# Patient Record
Sex: Male | Born: 1950 | Race: White | Hispanic: No | Marital: Married | Smoking: Never smoker
Health system: Southern US, Community
[De-identification: ages and names within clinical notes are randomized; demographics above are authoritative.]

## PROBLEM LIST (undated history)

## (undated) DIAGNOSIS — R519 Headache, unspecified: Secondary | ICD-10-CM

## (undated) DIAGNOSIS — K219 Gastro-esophageal reflux disease without esophagitis: Secondary | ICD-10-CM

## (undated) DIAGNOSIS — Z9889 Other specified postprocedural states: Secondary | ICD-10-CM

## (undated) DIAGNOSIS — R7303 Prediabetes: Secondary | ICD-10-CM

## (undated) DIAGNOSIS — N400 Enlarged prostate without lower urinary tract symptoms: Secondary | ICD-10-CM

## (undated) DIAGNOSIS — N390 Urinary tract infection, site not specified: Secondary | ICD-10-CM

## (undated) DIAGNOSIS — R112 Nausea with vomiting, unspecified: Secondary | ICD-10-CM

## (undated) DIAGNOSIS — C801 Malignant (primary) neoplasm, unspecified: Secondary | ICD-10-CM

## (undated) DIAGNOSIS — Z87442 Personal history of urinary calculi: Secondary | ICD-10-CM

## (undated) DIAGNOSIS — Z8781 Personal history of (healed) traumatic fracture: Secondary | ICD-10-CM

## (undated) DIAGNOSIS — Z87898 Personal history of other specified conditions: Secondary | ICD-10-CM

## (undated) DIAGNOSIS — R21 Rash and other nonspecific skin eruption: Secondary | ICD-10-CM

## (undated) DIAGNOSIS — R399 Unspecified symptoms and signs involving the genitourinary system: Secondary | ICD-10-CM

## (undated) DIAGNOSIS — I639 Cerebral infarction, unspecified: Secondary | ICD-10-CM

## (undated) DIAGNOSIS — K603 Anal fistula, unspecified: Secondary | ICD-10-CM

## (undated) DIAGNOSIS — K449 Diaphragmatic hernia without obstruction or gangrene: Secondary | ICD-10-CM

## (undated) DIAGNOSIS — R011 Cardiac murmur, unspecified: Secondary | ICD-10-CM

## (undated) DIAGNOSIS — M1990 Unspecified osteoarthritis, unspecified site: Secondary | ICD-10-CM

## (undated) DIAGNOSIS — F32A Depression, unspecified: Secondary | ICD-10-CM

## (undated) DIAGNOSIS — Z85828 Personal history of other malignant neoplasm of skin: Secondary | ICD-10-CM

## (undated) DIAGNOSIS — F419 Anxiety disorder, unspecified: Secondary | ICD-10-CM

## (undated) DIAGNOSIS — F329 Major depressive disorder, single episode, unspecified: Secondary | ICD-10-CM

## (undated) DIAGNOSIS — I2510 Atherosclerotic heart disease of native coronary artery without angina pectoris: Secondary | ICD-10-CM

## (undated) HISTORY — PX: SHOULDER ARTHROSCOPY W/ ROTATOR CUFF REPAIR: SHX2400

## (undated) HISTORY — PX: HERNIA REPAIR: SHX51

## (undated) HISTORY — PX: NASAL SEPTUM SURGERY: SHX37

## (undated) HISTORY — PX: BACK SURGERY: SHX140

## (undated) HISTORY — PX: EYE SURGERY: SHX253

## (undated) HISTORY — PX: CARDIAC CATHETERIZATION: SHX172

## (undated) HISTORY — PX: HAND SURGERY: SHX662

## (undated) HISTORY — PX: LUMBAR FUSION: SHX111

## (undated) HISTORY — DX: Unspecified osteoarthritis, unspecified site: M19.90

---
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
## 1979-05-24 HISTORY — PX: NASAL SEPTUM SURGERY: SHX37

## 1998-01-21 ENCOUNTER — Emergency Department (HOSPITAL_COMMUNITY): Admission: EM | Admit: 1998-01-21 | Discharge: 1998-01-21 | Payer: Self-pay | Admitting: Emergency Medicine

## 1998-08-30 ENCOUNTER — Ambulatory Visit (HOSPITAL_COMMUNITY): Admission: RE | Admit: 1998-08-30 | Discharge: 1998-08-30 | Payer: Self-pay | Admitting: Gastroenterology

## 1998-12-03 ENCOUNTER — Ambulatory Visit (HOSPITAL_COMMUNITY): Admission: RE | Admit: 1998-12-03 | Discharge: 1998-12-03 | Payer: Self-pay | Admitting: Gastroenterology

## 2008-08-24 ENCOUNTER — Ambulatory Visit: Payer: Self-pay | Admitting: Cardiology

## 2010-08-22 ENCOUNTER — Encounter: Admission: RE | Admit: 2010-08-22 | Discharge: 2010-08-22 | Payer: Self-pay | Admitting: Family Medicine

## 2011-02-04 NOTE — Assessment & Plan Note (Signed)
[facility]                            CARDIOLOGY OFFICE NOTE   JAQWON, MANFRED                       MRN:          [id number]  DATE:08/24/2008                            DOB:          02/23/1951    PRIMARY CARE PHYSICIAN:  Kristian Covey, PA   REASON FOR PRESENTATION:  Evaluate the patient with abnormal EKG.   HISTORY OF PRESENT ILLNESS:  The patient is a pleasant 60 year old  gentleman without prior cardiac history.  He was seeing Mr. Tysinger  recently and did complain of some abdominal discomfort.  This actually  radiated up in to his chest.  This happened sporadically.  However, he  has been taking Rolaids and this seems to help.  He does have some  abdominal discomfort to palpation and is actually having [place] to evaluate  and recently had an abdominal CT.  He does not get this discomfort.  He  does not get any chest discomfort.  He is not particularly active.  He  walks at work.  With this level of activity, he denies any chest  pressure, neck discomfort.  He occasionally get headaches and when he  does get headaches he will have this discomfort radiate in to his left  arm.  This was a discomfort in the back of the left side of his head.  It happens sporadically.  It goes away spontaneously.  He cannot bring  this on with activity.  He does not describe any shortness of breath.  He has no PND or orthopnea.  He has had no palpitation, presyncope, or  syncope.   When the patient was seen by his primary recently, he was noted to have  slightly abnormal EKG as described below.   PAST MEDICAL HISTORY:  Benign prostatic hypertrophy, gastroesophageal  reflux, and nephrolithiasis.   PAST SURGICAL HISTORY:  Two lumbar diskectomies, right rotator cuff  repair, left thumb surgery, and nasal surgery.   ALLERGIES:  Intolerance to codeine.   MEDICATIONS:  1. Flomax 0.4 mg daily.  2. Fluticasone.  3. Mucinex.  4. Prilosec 20 mg daily.  5.  Aspirin 81 mg daily.   SOCIAL HISTORY:  The patient is married.  He has 3 children.  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  He has never smoked cigarettes.  He does not drink  alcohol.   FAMILY HISTORY:  Noncontributory for early coronary artery disease.   REVIEW OF SYSTEMS:  As stated in the HPI and negative for difficulty  with the urinary stream.  Negative for all other systems.   PHYSICAL EXAMINATION:  GENERAL:  The patient is pleasant and in no  distress.  VITAL SIGNS:  Blood pressure 124/84, heart rate 72 and regular, and  weight 231 pounds, body mass index 26.  HEENT:  Eyes, unremarkable, pupils are equal, round and reactive to  light, fundi not visualized, oral mucosa unremarkable.  NECK:  No jugular venous distension at 45 degrees; carotid upstroke,  brisk and symmetrical.  No bruits.  No thyromegaly.  LYMPHATICS:  No cervical, axillary, inguinal adenopathy.  LUNGS:  Clear to auscultation bilaterally.  BACK:  No costovertebral angle tenderness.  CHEST:  Unremarkable.  HEART:  PMI not displaced or sustained.  S1 and S2 are within normal  limits.  No S3, no S4, no clicks, no rubs, no murmurs.  ABDOMEN:  Flat.  Bowel sound is normal in frequency and pitch.  No  bruits, no rebound, no guarding.  No midline pulsatile mass, no  hepatomegaly, no splenomegaly.  SKIN:  No rashes, no nodules.  EXTREMITIES:  2+ pulses throughout.  No edema, no cyanosis, no clubbing.  NEUROLOGIC:  Oriented to person, place, and time.  Cranial nerves II-XII  grossly intact.  Motor grossly intact.   EKG sinus rhythm, rate 66, axis within normal limits, intervals within  normal limits, RSR prime in V1 and V2.  No acute ST-T wave changes.   ASSESSMENT AND PLAN:  1. Abnormal EKG.  The patient does have an RSR prime in V1 and V2.      However, this is not indicative of any obstructive coronary artery      disease or other structural heart disease.  He otherwise does not      have any complaints consistent with  cardiac disease.  He has      minimal cardiovascular risk factors.  At this point, the pretest      probability of obstructive coronary artery disease or structural      heart disease is extremely low.  No further cardiovascular testing      is suggested.  2. Dyslipidemia.  The patient has a low HDL at 30.  His LDL is      acceptable as are his triglycerides and total cholesterol.  I had a      long discussion with him about the need to start walking to      increase his HDL.  3. Benign prostatic hypertrophy.  This was followed and he remains on      Flomax.  4. Followup.  The patient will come back to this clinic as needed.     Rollene Rotunda, MD, [facility]  Electronically Signed    JH/MedQ  DD: 08/24/2008  DT: 08/25/2008  Job #: [id number]   cc:   Kristian Covey, PA

## 2011-09-12 ENCOUNTER — Ambulatory Visit (HOSPITAL_COMMUNITY): Admission: RE | Admit: 2011-09-12 | Payer: Self-pay | Source: Ambulatory Visit | Admitting: Gastroenterology

## 2011-09-12 ENCOUNTER — Encounter (HOSPITAL_COMMUNITY): Admission: RE | Payer: Self-pay | Source: Ambulatory Visit

## 2011-09-12 SURGERY — EGD (ESOPHAGOGASTRODUODENOSCOPY)
Anesthesia: Moderate Sedation

## 2011-12-25 ENCOUNTER — Encounter (HOSPITAL_COMMUNITY): Payer: Self-pay | Admitting: *Deleted

## 2011-12-25 ENCOUNTER — Observation Stay (HOSPITAL_COMMUNITY)
Admission: EM | Admit: 2011-12-25 | Discharge: 2011-12-26 | Disposition: A | Discharge status: Home / Self Care | Payer: No Typology Code available for payment source | Attending: Internal Medicine | Admitting: Internal Medicine

## 2011-12-25 DIAGNOSIS — T783XXA Angioneurotic edema, initial encounter: Secondary | ICD-10-CM | POA: Diagnosis present

## 2011-12-25 DIAGNOSIS — R51 Headache: Secondary | ICD-10-CM | POA: Diagnosis not present

## 2011-12-25 DIAGNOSIS — R0989 Other specified symptoms and signs involving the circulatory and respiratory systems: Secondary | ICD-10-CM | POA: Insufficient documentation

## 2011-12-25 DIAGNOSIS — K219 Gastro-esophageal reflux disease without esophagitis: Secondary | ICD-10-CM | POA: Diagnosis not present

## 2011-12-25 DIAGNOSIS — J329 Chronic sinusitis, unspecified: Secondary | ICD-10-CM | POA: Diagnosis present

## 2011-12-25 DIAGNOSIS — R1310 Dysphagia, unspecified: Secondary | ICD-10-CM | POA: Diagnosis present

## 2011-12-25 DIAGNOSIS — M25519 Pain in unspecified shoulder: Secondary | ICD-10-CM | POA: Diagnosis not present

## 2011-12-25 DIAGNOSIS — X58XXXA Exposure to other specified factors, initial encounter: Secondary | ICD-10-CM | POA: Diagnosis not present

## 2011-12-25 DIAGNOSIS — R030 Elevated blood-pressure reading, without diagnosis of hypertension: Secondary | ICD-10-CM | POA: Insufficient documentation

## 2011-12-25 DIAGNOSIS — Z79899 Other long term (current) drug therapy: Secondary | ICD-10-CM | POA: Diagnosis not present

## 2011-12-25 DIAGNOSIS — IMO0001 Reserved for inherently not codable concepts without codable children: Secondary | ICD-10-CM | POA: Diagnosis present

## 2011-12-25 DIAGNOSIS — R0609 Other forms of dyspnea: Secondary | ICD-10-CM | POA: Insufficient documentation

## 2011-12-25 DIAGNOSIS — R519 Headache, unspecified: Secondary | ICD-10-CM | POA: Diagnosis present

## 2011-12-25 HISTORY — DX: Gastro-esophageal reflux disease without esophagitis: K21.9

## 2011-12-25 MED ORDER — DIPHENHYDRAMINE HCL 25 MG PO TABS: 25.0000 mg | ORAL_TABLET | Freq: Four times a day (QID) | ORAL | Status: DC

## 2011-12-25 MED ORDER — DIPHENHYDRAMINE HCL 50 MG/ML IJ SOLN
25.0000 mg | Freq: Once | INTRAMUSCULAR | Status: AC
  Administered 2011-12-25: 19:00:00 via INTRAVENOUS
  Filled 2011-12-25: qty 1

## 2011-12-25 MED ORDER — ACETAMINOPHEN 325 MG PO TABS
650.0000 mg | ORAL_TABLET | Freq: Once | ORAL | Status: AC
  Administered 2011-12-25: 650 mg via ORAL

## 2011-12-25 MED ORDER — FAMOTIDINE IN NACL 20-0.9 MG/50ML-% IV SOLN
20.0000 mg | Freq: Once | INTRAVENOUS | Status: AC
  Administered 2011-12-25: 20 mg via INTRAVENOUS
  Filled 2011-12-25: qty 50

## 2011-12-25 MED ORDER — DIPHENHYDRAMINE HCL 50 MG/ML IJ SOLN
INTRAMUSCULAR | Status: AC
  Filled 2011-12-25: qty 1

## 2011-12-25 MED ORDER — FAMOTIDINE 40 MG PO TABS: 40.0000 mg | ORAL_TABLET | Freq: Every day | ORAL | Status: DC

## 2011-12-25 MED ORDER — PREDNISONE 10 MG PO TABS: 20.0000 mg | ORAL_TABLET | Freq: Every day | ORAL | Status: DC

## 2011-12-25 NOTE — Discharge Instructions (Signed)
Would recommend that you see an allergist for formal testing   Angioedema Angioedema (AE) is sudden puffiness (swelling) of the skin. It can happen to the face, genitals, and other body parts. You may be reacting to something you are sensitive to (allergic reaction). It may have been passed to you from your parents (hereditary), or it may develop by itself (acquired). You may also get red itchy patches of skin (hives). Attacks may be mild. Some attacks are life-threatening. Most often, the puffiness happens fast. It often gets better in 24 to 48 hours.  HOME CARE  Carry your emergency allergy medicines with you.   Wear a medical bracelet.   Avoid things that you know will cause this reaction (triggers).  GET HELP RIGHT AWAY IF:   You have trouble breathing.   You have trouble swallowing.   You pass out (faint).   You have another attack.   Your attacks happen more often or get worse.   AE was passed to you by your parents and you want to start having children.  MAKE SURE YOU:   Understand these instructions.   Will watch your condition.   Will get help right away if you are not doing well or get worse.  Document Released: 08/27/2009 Document Revised: 08/28/2011 Document Reviewed: 08/27/2009 [facility] Patient Information 2012 [place], [place].

## 2011-12-25 NOTE — ED Notes (Signed)
ZOX:[id number]<AV> Expected date:12/25/11<BR> Expected time:<BR> Means of arrival:<BR> Comments:<BR> EMS GC - allergic reaction

## 2011-12-25 NOTE — ED Notes (Addendum)
beaton md alerted of pts condition. And given pts paper work from clinic and ekg from clinic

## 2011-12-25 NOTE — ED Notes (Signed)
MD at bedside. 

## 2011-12-25 NOTE — ED Provider Notes (Signed)
History     CSN: [id number]  Arrival date & time 12/25/11  1836   First MD Initiated Contact with Patient 12/25/11 2012      Chief Complaint  Patient presents with  . Allergic Reaction     HPI Patient presents with swelling to the left upper lip and difficulty swallowing which started today.  Patient has no previous history of abnormalities.  Patient also complains of difficulty swallowing.  Patient denies wheezing or stridor.  Currently the patient takes aspirin and omeprazole.  No unusual foods or allergenic foods eaten prior to the onset of symptoms. Past Medical History  Diagnosis Date  . GERD (gastroesophageal reflux disease)     History reviewed. No pertinent past surgical history.  History reviewed. No pertinent family history.  History  Substance Use Topics  . Smoking status: Never Smoker   . Smokeless tobacco: Not on file  . Alcohol Use: No      Review of Systems  All other systems reviewed and are negative.    Allergies  Codeine  Home Medications   Current Outpatient Rx  Name Route Sig Dispense Refill  . ACETAMINOPHEN 500 MG PO TABS Oral Take 1,500 mg by mouth every 6 (six) hours as needed. pain    . ASPIRIN 81 MG PO TABS Oral Take 81 mg by mouth daily.    . OMEGA-3 FATTY ACIDS 1000 MG PO CAPS Oral Take 2 g by mouth daily.    Marland Kitchen OMEPRAZOLE 10 MG PO CPDR Oral Take 10 mg by mouth daily.    Marland Kitchen DIPHENHYDRAMINE HCL 25 MG PO TABS Oral Take 1 tablet (25 mg total) by mouth every 6 (six) hours. 20 tablet 0  . FAMOTIDINE 40 MG PO TABS Oral Take 1 tablet (40 mg total) by mouth daily. 10 tablet 0  . PREDNISONE 10 MG PO TABS Oral Take 2 tablets (20 mg total) by mouth daily. 15 tablet 0    BP 142/68  Pulse 72  Temp(Src) 97.5 F (36.4 C) (Oral)  Resp 18  SpO2 94%  Physical Exam  Nursing note and vitals reviewed. Constitutional: He is oriented to person, place, and time. He appears well-developed and well-nourished. No distress.  HENT:  Head: Normocephalic and  atraumatic.    Eyes: Pupils are equal, round, and reactive to light.  Neck: Normal range of motion.  Cardiovascular: Normal rate and intact distal pulses.   Pulmonary/Chest: No respiratory distress.  Abdominal: Normal appearance. He exhibits no distension.  Musculoskeletal: Normal range of motion.  Neurological: He is alert and oriented to person, place, and time. No cranial nerve deficit.  Skin: Skin is warm and dry. No rash noted.  Psychiatric: He has a normal mood and affect. His behavior is normal.    ED Course  Procedures (including critical care time) Scheduled Meds:   . acetaminophen  650 mg Oral Once  . diphenhydrAMINE  25 mg Intravenous Once  . famotidine (PEPCID) IV  20 mg Intravenous Once   Continuous Infusions:  PRN Meds:.  Labs Reviewed - No data to display No results found.   1. Angioedema       MDM          Nelia Shi, MD 12/25/11 2322

## 2011-12-25 NOTE — ED Notes (Addendum)
Per ems pt is from [facility] [place]. Pt reports that this morning he ate a normal breakfast that he always eats, no change in medications or detergents. Pt was at work and sent to clinic due to difficulty breathing and increased facial swelling and redness. At clinic they gave epi 1:1 0.3mg  IM ad 40mg  kenalog and 4mg  decadron. EMS gave 50mg  benadryl IV. Clinic called ems bc pt was continuing to have difficulty breathing and facial swelling.  Pt alert and oriented x4. Pt reports "it feels like it is hard to breath, a knot in his throat and difficult to swallow."

## 2011-12-26 ENCOUNTER — Encounter (HOSPITAL_COMMUNITY): Payer: Self-pay | Admitting: Family Medicine

## 2011-12-26 ENCOUNTER — Observation Stay (HOSPITAL_COMMUNITY): Payer: No Typology Code available for payment source

## 2011-12-26 DIAGNOSIS — T783XXA Angioneurotic edema, initial encounter: Secondary | ICD-10-CM | POA: Diagnosis present

## 2011-12-26 DIAGNOSIS — J329 Chronic sinusitis, unspecified: Secondary | ICD-10-CM | POA: Diagnosis present

## 2011-12-26 DIAGNOSIS — R1310 Dysphagia, unspecified: Secondary | ICD-10-CM | POA: Diagnosis present

## 2011-12-26 DIAGNOSIS — IMO0001 Reserved for inherently not codable concepts without codable children: Secondary | ICD-10-CM | POA: Diagnosis present

## 2011-12-26 DIAGNOSIS — M25519 Pain in unspecified shoulder: Secondary | ICD-10-CM | POA: Diagnosis present

## 2011-12-26 DIAGNOSIS — K219 Gastro-esophageal reflux disease without esophagitis: Secondary | ICD-10-CM | POA: Diagnosis present

## 2011-12-26 DIAGNOSIS — R51 Headache: Secondary | ICD-10-CM | POA: Diagnosis present

## 2011-12-26 DIAGNOSIS — R519 Headache, unspecified: Secondary | ICD-10-CM | POA: Diagnosis present

## 2011-12-26 LAB — CBC
MCH: 32.9 pg (ref 26.0–34.0)
MCHC: 35.3 g/dL (ref 30.0–36.0)
Platelets: 164 10*3/uL (ref 150–400)

## 2011-12-26 LAB — BASIC METABOLIC PANEL
Calcium: 9.2 mg/dL (ref 8.4–10.5)
GFR calc non Af Amer: >90 mL/min (ref >90)
Sodium: 139 mEq/L (ref 135–145)

## 2011-12-26 MED ORDER — ACETAMINOPHEN 325 MG PO TABS: 650.0000 mg | ORAL_TABLET | Freq: Four times a day (QID) | ORAL | Status: DC | PRN

## 2011-12-26 MED ORDER — OMEPRAZOLE 10 MG PO CPDR: DELAYED_RELEASE_CAPSULE | ORAL | Status: DC

## 2011-12-26 MED ORDER — DIPHENHYDRAMINE HCL 25 MG PO CAPS: 25.0000 mg | ORAL_CAPSULE | ORAL | Status: DC | PRN

## 2011-12-26 MED ORDER — AMOXICILLIN-POT CLAVULANATE 875-125 MG PO TABS
1.0000 | ORAL_TABLET | Freq: Two times a day (BID) | ORAL | Status: DC
  Administered 2011-12-26: 1 via ORAL
  Filled 2011-12-26 (×4): qty 1

## 2011-12-26 MED ORDER — FAMOTIDINE 20 MG PO TABS
20.0000 mg | ORAL_TABLET | Freq: Two times a day (BID) | ORAL | Status: DC
  Administered 2011-12-26: 20 mg via ORAL
  Filled 2011-12-26 (×4): qty 1

## 2011-12-26 MED ORDER — OMEPRAZOLE 10 MG PO CPDR: 10.0000 mg | DELAYED_RELEASE_CAPSULE | Freq: Every day | ORAL | Status: DC

## 2011-12-26 MED ORDER — SODIUM CHLORIDE 0.9 % IV SOLN
INTRAVENOUS | Status: AC
  Administered 2011-12-26: 75 mL via INTRAVENOUS

## 2011-12-26 MED ORDER — BOOST / RESOURCE BREEZE PO LIQD
1.0000 | Freq: Three times a day (TID) | ORAL | Status: DC
  Administered 2011-12-26 (×2): 1 via ORAL

## 2011-12-26 MED ORDER — ACETAMINOPHEN 650 MG RE SUPP: 650.0000 mg | Freq: Four times a day (QID) | RECTAL | Status: DC | PRN

## 2011-12-26 MED ORDER — HYDROMORPHONE HCL PF 1 MG/ML IJ SOLN
0.5000 mg | Freq: Four times a day (QID) | INTRAMUSCULAR | Status: DC | PRN
  Administered 2011-12-26: 0.5 mg via INTRAVENOUS
  Filled 2011-12-26: qty 1

## 2011-12-26 MED ORDER — TRAMADOL HCL 50 MG PO TABS
50.0000 mg | ORAL_TABLET | Freq: Four times a day (QID) | ORAL | Status: DC | PRN
  Administered 2011-12-26 (×2): 50 mg via ORAL
  Filled 2011-12-26 (×2): qty 1

## 2011-12-26 MED ORDER — OMEPRAZOLE 20 MG PO CPDR: 20.0000 mg | DELAYED_RELEASE_CAPSULE | Freq: Every day | ORAL | Status: DC

## 2011-12-26 MED ORDER — SODIUM CHLORIDE 0.9 % IJ SOLN
3.0000 mL | Freq: Two times a day (BID) | INTRAMUSCULAR | Status: DC
  Administered 2011-12-26: 3 mL via INTRAVENOUS

## 2011-12-26 MED ORDER — PREDNISONE 10 MG PO TABS: ORAL_TABLET | ORAL | Status: DC

## 2011-12-26 MED ORDER — DIPHENHYDRAMINE HCL 25 MG PO TABS: 25.0000 mg | ORAL_TABLET | Freq: Four times a day (QID) | ORAL | Status: DC | PRN

## 2011-12-26 MED ORDER — METHYLPREDNISOLONE SODIUM SUCC 125 MG IJ SOLR
80.0000 mg | Freq: Two times a day (BID) | INTRAMUSCULAR | Status: DC
  Administered 2011-12-26: 80 mg via INTRAVENOUS
  Filled 2011-12-26 (×4): qty 1.28

## 2011-12-26 NOTE — Progress Notes (Signed)
INITIAL ADULT NUTRITION ASSESSMENT Date: 12/26/2011   Time: 9:57 AM Reason for Assessment: Nutrition Risk for Dysphagia  ASSESSMENT: Male 61 y.o.  Dx: Angioedema of lips  Hx:  Past Medical History  Diagnosis Date  . GERD (gastroesophageal reflux disease)   . Vertebral fracture     x2 neck area    Related Meds:  Scheduled Meds:   . acetaminophen  650 mg Oral Once  . amoxicillin-clavulanate  1 tablet Oral Q12H  . diphenhydrAMINE  25 mg Intravenous Once  . famotidine (PEPCID) IV  20 mg Intravenous Once  . methylPREDNISolone (SOLU-MEDROL) injection  80 mg Intravenous Q12H  . sodium chloride  3 mL Intravenous Q12H   Continuous Infusions:   . sodium chloride 75 mL (12/26/11 0400)   PRN Meds:.acetaminophen, acetaminophen, diphenhydrAMINE, traMADol   Ht: 6\' 3"  (190.5 cm)  Wt: 225 lb 11.2 oz (102.377 kg)  Ideal Wt: 89.09 kg % Ideal Wt: 114.7 %  *Patient Stated UBW of 292-232 lb. a week ago. Patient has lost approximately 4 lb. over 1 week.  Usual Wt: 292-232 lb.  % Usual Wt: 96.9-98.2%   Body mass index is 28.21 kg/(m^2). (Overweight)   Food/Nutrition Related Hx: Patient reports stomach pain. He reported he is hungry but due to his clear liquid diet restriction he has limited options. He reported he has been eating apple sauce and dislikes the soup broth. Would like to have jello and try Nutrition supplement Resource Breeze. He reported good PO intake prior to hospitalization. He reports difficulty swallowing, upon swallowing foods feel like they lump and get stuck in the throat.   Labs:  CMP     Component Value Date/Time   NA 139 12/26/2011 0420   K 3.8 12/26/2011 0420   CL 105 12/26/2011 0420   CO2 22 12/26/2011 0420   GLUCOSE 139* 12/26/2011 0420   BUN 14 12/26/2011 0420   CREATININE 0.73 12/26/2011 0420   CALCIUM 9.2 12/26/2011 0420   GFRNONAA >90 12/26/2011 0420   GFRAA >90 12/26/2011 0420    Intake/Output Summary (Last 24 hours) at 12/26/11 1000 Last data filed at 12/26/11  0700  Gross per 24 hour  Intake    620 ml  Output    400 ml  Net    220 ml     Diet Order: Clear Liquid  Supplements/Tube Feeding: none at this time  IVF:    sodium chloride Last Rate: 75 mL (12/26/11 0400)    Estimated Nutritional Needs:   Kcal: 1900-2200 Protein: 112.5-132.9 grams Fluid: 1 ml per kcal  NUTRITION DIAGNOSIS: -Inadequate oral intake (NI-2.1).  Status: Ongoing  RELATED TO: limited ability to eat  AS EVIDENCE BY: Clear liquid diet restriction, patient with poor PO intake due to lack of food options and weight loss of approximately 4 lb over 1 week.   MONITORING/EVALUATION(Goals): Diet Advancement, Weight Trends, Labs, PO intake, I/O's 1. PO intake > 75% at meals and supplements 2. Diet advancement as medically able 3. Minimize weight loss/ Promote weight maintenance.  4. Upon diet advancement ease of swallowing with Dysphagia 3 diet.   EDUCATION NEEDS: -No education needs identified at this time  INTERVENTION: 1. Recommend Dysphagia 3 diet when diet is advanced. 2. Will order patient snack of Jello BID. 3. Will order Resource Breeze Nutrition Supplement TID. Provides 750 kcal and 27 grams of protein daily.   Dietitian [phone]  DOCUMENTATION CODES Per approved criteria  -Not Applicable    Iven Finn [facility] 12/26/2011, 9:57 AM

## 2011-12-26 NOTE — H&P (Signed)
History and Physical Examination  Date: 12/26/2011  Patient name: Kyle Davidson Medical record number: [id number] Date of birth: 06/06/1951 Age: 61 y.o. Gender: male PCP: No primary provider on file.  Attending physician: Dorothea Ogle, MD  Chief Complaint:  Chief Complaint  Patient presents with  . Allergic Reaction     History of Present Illness: Kyle Davidson is an 61 y.o. male with GERD who had been taking some sudafed for a sinus infection reports that this morning he ate a normal breakfast with foods that he always eats like sausage and tomato, no change in medications or detergents and developed around 10 am swelling in the lips and sensation of lump in the throat. Pt was at work and sent to his primary care clinic due to some increased work of breathing and increased facial swelling and redness. At clinic they gave epi 1:1 0.3mg  IM and 40mg  kenalog and 4mg  decadron. EMS gave 50mg  benadryl IV. Clinic called ems bc pt was continuing to have difficulty breathing and facial swelling. Pt was seen in the ER and monitored for several hours.  He had a small improvement in the swelling of the lips but continued to have a sensation of a lump in the throat and difficulty swallowing.  He did not feel comfortable going home because he is currently living alone and his wife is out of town.  In his own words, Pt reports "it feels like it is hard to breath, a knot in my throat and difficult to swallow." He has remained hemodynamically stable and appears to be in no distress.  A hospital admission was requested for observation to be sure symptoms completely resolve and to monitor pt for any deterioration in symptoms.     Past Medical History Past Medical History  Diagnosis Date  . GERD (gastroesophageal reflux disease)     Past Surgical History History reviewed. No pertinent past surgical history.  Home Meds: Prior to Admission medications   Medication Sig Start Date End Date Taking? Authorizing  Provider  acetaminophen (TYLENOL) 500 MG tablet Take 1,500 mg by mouth every 6 (six) hours as needed. pain   Yes Historical Provider, MD  aspirin 81 MG tablet Take 81 mg by mouth daily.   Yes Historical Provider, MD  fish oil-omega-3 fatty acids 1000 MG capsule Take 2 g by mouth daily.   Yes Historical Provider, MD  omeprazole (PRILOSEC) 10 MG capsule Take 10 mg by mouth daily.   Yes Historical Provider, MD  diphenhydrAMINE (BENADRYL) 25 MG tablet Take 1 tablet (25 mg total) by mouth every 6 (six) hours. 12/25/11 01/24/12  Nelia Shi, MD  famotidine (PEPCID) 40 MG tablet Take 1 tablet (40 mg total) by mouth daily. 12/25/11 12/24/12  Nelia Shi, MD  predniSONE (DELTASONE) 10 MG tablet Take 2 tablets (20 mg total) by mouth daily. 12/25/11   Nelia Shi, MD    Allergies: Codeine  Social History:  History   Social History  . Marital Status: Married    Spouse Name: N/A    Number of Children: N/A  . Years of Education: N/A   Occupational History  . Not on file.   Social History Main Topics  . Smoking status: Never Smoker   . Smokeless tobacco: Not on file  . Alcohol Use: No  . Drug Use: No  . Sexually Active:    Other Topics Concern  . Not on file   Social History Narrative  . No narrative on file  Family History: History reviewed. No pertinent family history.  Review of Systems: Pertinent items are noted in HPI. All other systems reviewed and reported as negative.   Physical Exam: Blood pressure 142/68, pulse 72, temperature 97.5 F (36.4 C), temperature source Oral, resp. rate 18, SpO2 94.00%. General appearance: alert, cooperative, appears stated age and no distress Head: Normocephalic, without obvious abnormality, atraumatic Eyes: negative, conjunctivae/corneas clear. PERRL, EOM's intact. Fundi benign. Nose: sinus tenderness bilateral, thick yellow crusted turbinates bilateral Throat: lips, mucosa, and tongue normal; teeth and gums normal Neck: no adenopathy, no  carotid bruit, no JVD, supple, symmetrical, trachea midline and thyroid not enlarged, symmetric, no tenderness/mass/nodules Back: symmetric, no curvature. ROM normal. No CVA tenderness. Lungs: clear to auscultation bilaterally Chest wall: no tenderness Heart: S1, S2 normal Abdomen: soft, non-tender; bowel sounds normal; no masses,  no organomegaly Extremities: extremities normal, atraumatic, no cyanosis or edema Pulses: 2+ and symmetric Neurologic: Alert and oriented X 3, normal strength and tone. Normal symmetric reflexes. Normal coordination and gait  Lab  And Imaging results:  No results found for this or any previous visit (from the past 24 hour(s)). EKG Results:  No orders found for this or any previous visit.    Impression   Angioedema of lips  GERD (gastroesophageal reflux disease)  Dysphagia  Headache  Sinusitis  Elevated BP  Left Shoulder pain   Plan  Admit for further observation.  Pt has received epinephrine, benadryl, famotidine, and steroids.  Check soft tissue xrays of neck, and start clear liquid diet, monitor swallowing, monitor respiratory status.  Treat sinusitis.  Please see orders.   Standley Dakins MD [facility] [place], [place] [phone] 12/26/2011, 12:07 AM

## 2011-12-26 NOTE — Progress Notes (Signed)
See discharge summary

## 2011-12-26 NOTE — Discharge Summary (Signed)
DISCHARGE SUMMARY  Kyle Davidson  MR#: [id number]  DOB:04-25-1951  Date of Admission: 12/25/2011 Date of Discharge: 12/26/2011  Attending Physician:Kyle Davidson K  Patient's PCP: Kyle Davidson  Consults: -none  Discharge Diagnoses: Present on Admission:  .Angioedema of lips .GERD (gastroesophageal reflux disease) .Dysphagia .Headache .Sinusitis .Elevated BP .Shoulder pain   Initial presentation: Patient is a 61 year old white male past we'll history of GERD and who presented after an severe allergic reaction to an unknown cause and presented with angioedema of the face and lips and complaining of difficulty swallowing. Patient was given steroids in the emergency room plus Benadryl plus H2 blocker. And was admitted to the hospital service overnight for observation.  Hospital Course: Patient Active Problem List  Diagnoses  . Angioedema of lips: Improved by day 2. Continued on steroids plus Benadryl plus H2 blocker. My afternoon, he was tolerating some by mouth. He seemed be quite anxious and complaining of generalized abdominal pain, although when pressed further this was been going on for the last month. We'll plan to discharge the patient home on a prednisone taper plus Benadryl plus H2 blocker and follow up with his PCP. I've advised allergy testing in the near future to determine what the exact etiology was. The meantime we'll hold his daily aspirin.   Marland Kitchen GERD (gastroesophageal reflux disease) continue PPI after course of H2 blockers finished.   Marland Kitchen Dysphagia: Stable. Soft tissue film showed no signs of any airway compromise. I feel more this may been secondary to anxiety.   .   .   .      Medication List  As of 12/26/2011 10:39 PM   STOP taking these medications         aspirin 81 MG tablet         TAKE these medications         acetaminophen 500 MG tablet   Commonly known as: TYLENOL   Take 1,500 mg by mouth every 6 (six) hours as needed. pain      diphenhydrAMINE  25 MG tablet   Commonly known as: BENADRYL   Take 1 tablet (25 mg total) by mouth every 6 (six) hours as needed for itching.      famotidine 40 MG tablet   Commonly known as: PEPCID   Take 1 tablet (40 mg total) by mouth daily.      fish oil-omega-3 fatty acids 1000 MG capsule   Take 2 g by mouth daily.      omeprazole 10 MG capsule   Commonly known as: PRILOSEC   Take 1 capsule (10 mg total) by mouth daily.      predniSONE 10 MG tablet   Commonly known as: DELTASONE   Take 60mg  (6 pills) on Day 1, then 50mg  on day 2, then 40mg  on day 3 and decrease by 10mg  daily until finished.             Day of Discharge BP 149/84  Pulse 80  Temp(Src) 97.7 F (36.5 C) (Oral)  Resp 20  Ht 6\' 3"  (1.905 m)  Wt 102.377 kg (225 lb 11.2 oz)  BMI 28.21 kg/m2  SpO2 95%  Physical Exam: General: Alert and oriented x3, mild distress secondary to anxiousness, fatigued HEENT: Normocephalic, minimal edema around the eyelids, mucous membranes are dry But after: Regular rate and rhythm, S1-S2 Lungs: Clear to auscultation bilaterally, no wheezing Abdomen: Soft, nontender, nondistended Extremity: No clubbing or cyanosis, trace pitting edema  Results for orders placed during the hospital encounter of  12/25/11 (from the past 24 hour(s))  CBC     Status: Normal   Collection Time   12/26/11  4:20 AM      Component Value Range   WBC 8.3  4.0 - 10.5 (K/uL)   RBC 5.07  4.22 - 5.81 (MIL/uL)   Hemoglobin 16.7  13.0 - 17.0 (g/dL)   HCT 40.9  81.1 - 91.4 (%)   MCV 93.3  78.0 - 100.0 (fL)   MCH 32.9  26.0 - 34.0 (pg)   MCHC 35.3  30.0 - 36.0 (g/dL)   RDW 78.2  95.6 - 21.3 (%)   Platelets 164  150 - 400 (K/uL)  BASIC METABOLIC PANEL     Status: Abnormal   Collection Time   12/26/11  4:20 AM      Component Value Range   Sodium 139  135 - 145 (mEq/L)   Potassium 3.8  3.5 - 5.1 (mEq/L)   Chloride 105  96 - 112 (mEq/L)   CO2 22  19 - 32 (mEq/L)   Glucose, Bld 139 (*) 70 - 99 (mg/dL)   BUN 14  6 - 23  (mg/dL)   Creatinine, Ser 0.86  0.50 - 1.35 (mg/dL)   Calcium 9.2  8.4 - 57.8 (mg/dL)   GFR calc non Af Amer >90  >90 (mL/min)   GFR calc Af Amer >90  >90 (mL/min)    Disposition: Improved, being discharged home in the care of his wife   Follow-up Appts: Discharge Orders    Future Orders Please Complete By Expires   DIET DYS 3      Scheduling Instructions:   Continue soft diet for next few days   Increase activity slowly           Tests Needing Follow-up: allergy testing in the future  Time spent in discharge (includes decision making & examination of pt): 55 minutes  Signed: Hollice Espy 12/26/2011, 10:39 PM

## 2011-12-26 NOTE — Progress Notes (Signed)
Subjective: Awake, alert. Reports no improvement in swelling of face/lips. Continued difficulty swallowing. Reports sensation of something in throat. Also complains diffuse abdominal pain particularly LLQ.  Objective: Vital signs Filed Vitals:   12/26/11 0015 12/26/11 0030 12/26/11 0209 12/26/11 0500  BP: 145/72 132/63 136/88 142/81  Pulse: 74 73 74 70  Temp:   97.4 F (36.3 C) 97.7 F (36.5 C)  TempSrc:   Oral Oral  Resp:   18 18  Height:   6\' 3"  (1.905 m)   Weight:   102.377 kg (225 lb 11.2 oz)   SpO2: 96% 94% 96% 96%   Weight change:  Last BM Date: 12/25/11  Intake/Output from previous day: 04/04 0701 - 04/05 0700 In: 620 [P.O.:120; I.V.:500] Out: 400 [Urine:400]     Physical Exam: General: Alert, awake, oriented x3, in no acute distress but somewhat anxious HEENT: No bruits, no goiter. Face flushed with slightly puffy eyelids. Lips mild to moderate swelling. Mucus membranes mouth moist/pink. Neck soft, supple Heart: Regular rate and rhythm, without murmurs, rubs, gallops. Lungs: Normal effort.  Breath sounds clear to auscultation bilaterally. No wheeze, rhonchi Abdomen: Soft, moderate tenderness to palpation particulary LLQ, nondistended, positive bowel sounds but somewhat sluggish. Extremities: No clubbing cyanosis or edema with positive pedal pulses. Neuro: Grossly intact, nonfocal. Cranial nerve II-XII intact    Lab Results: Basic Metabolic Panel:  Basename 12/26/11 0420  NA 139  K 3.8  CL 105  CO2 22  GLUCOSE 139*  BUN 14  CREATININE 0.73  CALCIUM 9.2  MG --  PHOS --   Liver Function Tests: No results found for this basename: AST:2,ALT:2,ALKPHOS:2,BILITOT:2,PROT:2,ALBUMIN:2 in the last 72 hours No results found for this basename: LIPASE:2,AMYLASE:2 in the last 72 hours No results found for this basename: AMMONIA:2 in the last 72 hours CBC:  Basename 12/26/11 0420  WBC 8.3  NEUTROABS --  HGB 16.7  HCT 47.3  MCV 93.3  PLT 164   Cardiac  Enzymes: No results found for this basename: CKTOTAL:3,CKMB:3,CKMBINDEX:3,TROPONINI:3 in the last 72 hours BNP: No results found for this basename: PROBNP:3 in the last 72 hours D-Dimer: No results found for this basename: DDIMER:2 in the last 72 hours CBG: No results found for this basename: GLUCAP:6 in the last 72 hours Hemoglobin A1C: No results found for this basename: HGBA1C in the last 72 hours Fasting Lipid Panel: No results found for this basename: CHOL,HDL,LDLCALC,TRIG,CHOLHDL,LDLDIRECT in the last 72 hours Thyroid Function Tests: No results found for this basename: TSH,T4TOTAL,FREET4,T3FREE,THYROIDAB in the last 72 hours Anemia Panel: No results found for this basename: VITAMINB12,FOLATE,FERRITIN,TIBC,IRON,RETICCTPCT in the last 72 hours Coagulation: No results found for this basename: LABPROT:2,INR:2 in the last 72 hours Urine Drug Screen: Drugs of Abuse  No results found for this basename: labopia, cocainscrnur, labbenz, amphetmu, thcu, labbarb    Alcohol Level: No results found for this basename: ETH:2 in the last 72 hours Urinalysis: No results found for this basename: COLORURINE:2,APPERANCEUR:2,LABSPEC:2,PHURINE:2,GLUCOSEU:2,HGBUR:2,BILIRUBINUR:2,KETONESUR:2,PROTEINUR:2,UROBILINOGEN:2,NITRITE:2,LEUKOCYTESUR:2 in the last 72 hours Misc. Labs:  No results found for this or any previous visit (from the past 240 hour(s)).  Studies/Results: No results found.  Medications: Scheduled Meds:   . acetaminophen  650 mg Oral Once  . amoxicillin-clavulanate  1 tablet Oral Q12H  . diphenhydrAMINE  25 mg Intravenous Once  . famotidine (PEPCID) IV  20 mg Intravenous Once  . sodium chloride  3 mL Intravenous Q12H   Continuous Infusions:   . sodium chloride 75 mL (12/26/11 0400)   PRN Meds:.acetaminophen, acetaminophen, diphenhydrAMINE, traMADol  Assessment/Plan:  Principal Problem:  *Angioedema of lips: etiology unclear. Not much improvement this am after getting  epinephrine, benadryl, famotidine and steroids in ED. Xray of neck results without abnormalities.  Continue famotidine and benadryl. Sats range 94-97 room air.  Active Problems:  Abdominal pain: etiology uncertain. Moderate tenderness to palpation LLQ. BS sluggish. Reports gas. Will keep on clears and check KUB.    GERD (gastroesophageal reflux disease):  On clear liquids and pepcid.    Dysphagia: Likely related to #1. Tolerating clear liquids and po meds. Neck/soft tissue xray results without abnormalities  Monitor closely   Elevated BP: no hx HTN. SBP range 132-142.  Monitor  Dispo: to home when medically stable. Hopefully 24-48hrs.   Code status: full code.       LOS: 1 day   [facility] M 12/26/2011, 8:32 AM

## 2011-12-26 NOTE — Progress Notes (Signed)
   CARE MANAGEMENT NOTE 12/26/2011  Patient:  Kyle Davidson,Kyle Davidson   Account Number:  [id number]  Date Initiated:  12/26/2011  Documentation initiated by:  Lanier Clam  Subjective/Objective Assessment:   ADMITTED W/SWELLING FACE/LIPS.     Action/Plan:   FROM HOME W/SPOUSE   Anticipated DC Date:  12/27/2011   Anticipated DC Plan:  HOME/SELF CARE         Choice offered to / List presented to:             Status of service:  In process, will continue to follow Medicare Important Message given?   (If response is "NO", the following Medicare IM given date fields will be blank) Date Medicare IM given:   Date Additional Medicare IM given:    Discharge Disposition:    Per UR Regulation:  Reviewed for med. necessity/level of care/duration of stay  If discussed at Long Length of Stay Meetings, dates discussed:    Comments:  12/26/11 [facility] RN,BSN NCM [phone]

## 2012-01-14 ENCOUNTER — Other Ambulatory Visit: Payer: Self-pay | Admitting: Gastroenterology

## 2012-01-21 ENCOUNTER — Ambulatory Visit
Admission: RE | Admit: 2012-01-21 | Discharge: 2012-01-21 | Disposition: A | Discharge status: Home / Self Care | Payer: No Typology Code available for payment source | Source: Ambulatory Visit | Attending: Gastroenterology | Admitting: Gastroenterology

## 2012-02-04 ENCOUNTER — Other Ambulatory Visit: Payer: Self-pay | Admitting: Gastroenterology

## 2012-02-09 ENCOUNTER — Ambulatory Visit
Admission: RE | Admit: 2012-02-09 | Discharge: 2012-02-09 | Disposition: A | Discharge status: Home / Self Care | Payer: No Typology Code available for payment source | Source: Ambulatory Visit | Attending: Gastroenterology | Admitting: Gastroenterology

## 2013-07-11 ENCOUNTER — Encounter (INDEPENDENT_AMBULATORY_CARE_PROVIDER_SITE_OTHER): Payer: Self-pay | Admitting: Surgery

## 2013-07-11 ENCOUNTER — Ambulatory Visit (INDEPENDENT_AMBULATORY_CARE_PROVIDER_SITE_OTHER): Payer: BC Managed Care – PPO | Admitting: Surgery

## 2013-07-11 ENCOUNTER — Encounter (INDEPENDENT_AMBULATORY_CARE_PROVIDER_SITE_OTHER): Payer: Self-pay

## 2013-07-11 VITALS — BP 140/92 | HR 80 | Temp 98.2°F | Resp 16 | Ht 75.0 in | Wt 228.8 lb

## 2013-07-11 DIAGNOSIS — K4020 Bilateral inguinal hernia, without obstruction or gangrene, not specified as recurrent: Secondary | ICD-10-CM | POA: Insufficient documentation

## 2013-07-11 DIAGNOSIS — K5900 Constipation, unspecified: Secondary | ICD-10-CM | POA: Insufficient documentation

## 2013-07-11 DIAGNOSIS — K429 Umbilical hernia without obstruction or gangrene: Secondary | ICD-10-CM

## 2013-07-11 NOTE — Progress Notes (Signed)
Patient ID: Kyle Davidson, male   DOB: Jun 21, 1951, 62 y.o.   MRN: [id number]  Chief Complaint  Patient presents with  . New Evaluation    eval bilateral ing hernias    HPI Kyle Davidson is a 61 y.o. male.  Patient sent at the request of Dr. Herb Grays with multiple complaints today. He complains of head and face pain as well as left ear pain. He complains of abdominal pain especially under his left rib cage, constipation, green stool, and bilateral inguinal hernia. He has been evaluated with CT scanning which shows multiple right renal calculi nonobstructing, bilateral inguinal hernia, an appendicolith which is noninflamed. Mostly of his abdominal pain is left upper quadrant sharp especially when he turns but not at rest. He has problems with constipation and prolonged sitting on the commode and has a bowel movement every 3 days or so which is hard. He does a lot of straining. No blood in his urine. Hemoccult positive test he states the doctors office but normal colonoscopy 2 years ago. No visible blood when he has a bowel movement. HPI  Past Medical History  Diagnosis Date  . GERD (gastroesophageal reflux disease)   . Vertebral fracture     x2 neck area  . Arthritis     Past Surgical History  Procedure Laterality Date  . Back surgery    . Shoulder arthroscopy    . Neck surgery      Family History  Problem Relation Age of Onset  . Cancer Father     bladder & prostate    Social History History  Substance Use Topics  . Smoking status: Never Smoker   . Smokeless tobacco: Never Used  . Alcohol Use: No    Allergies  Allergen Reactions  . Codeine Other (See Comments)    Bad headaches.....makes him see things    Current Outpatient Prescriptions  Medication Sig Dispense Refill  . acetaminophen (TYLENOL) 500 MG tablet Take 1,500 mg by mouth every 6 (six) hours as needed. pain      . omeprazole (PRILOSEC) 10 MG capsule Take 40 mg by mouth 2 (two) times daily.       No  current facility-administered medications for this visit.    Review of Systems Review of Systems  Constitutional: Negative for fatigue.  HENT: Positive for congestion and ear pain.   Eyes: Negative.   Respiratory: Negative.   Gastrointestinal: Positive for abdominal pain, blood in stool and abdominal distention.  Genitourinary: Positive for testicular pain.  Musculoskeletal: Positive for back pain.  Neurological: Negative.   Hematological: Negative.   Psychiatric/Behavioral: Negative.     Blood pressure 140/92, pulse 80, temperature 98.2 F (36.8 C), temperature source Temporal, resp. rate 16, height 6\' 3"  (1.905 m), weight 228 lb 12.8 oz (103.783 kg).  Physical Exam Physical Exam  Constitutional: He is oriented to person, place, and time. He appears well-developed and well-nourished.  HENT:  Head: Normocephalic and atraumatic.  Eyes: EOM are normal. Pupils are equal, round, and reactive to light.  Neck: Normal range of motion. Neck supple.  Cardiovascular: Normal rate and regular rhythm.   Pulmonary/Chest: Effort normal and breath sounds normal.  Abdominal: He exhibits distension. A hernia is present. Hernia confirmed positive in the right inguinal area and confirmed positive in the left inguinal area.    Genitourinary:     Musculoskeletal: Normal range of motion.  Neurological: He is alert and oriented to person, place, and time.  Skin: Skin is warm.  Psychiatric: He has a normal mood and affect. His behavior is normal. Thought content normal.    Data Reviewed Ct A/P NOVANT  Scan shows right-sided kidney stones without hydronephrosis an appendicolith noninflamed bilaterally inguinal hernia  Assessment    Bilateral inguinal hernia reducible  Chronic constipation  Kidney stones right without obstruction  Epididymal cyst left scrotum    Plan    The patient has multiple complaints and problems. I explained to him that his inguinal hernias are not causing his  head and ear problems. He does have what sounds like constipation and now start him on MiraLAX twice a day, high fiber diet and have given instructions for that. I do not feel the appendicolith is causing his abdominal complaints at this time and can be followed. Recommend laparoscopic repair bilateral inguinal hernia.The risk of hernia repair include bleeding,  Infection,   Recurrence of the hernia,  Mesh use, chronic pain,  Organ injury,  Bowel injury,  Bladder injury,   nerve injury with numbness around the incision,  Death,  and worsening of preexisting  medical problems.  The alternatives to surgery have been discussed as well..  Long term expectations of both operative and non operative treatments have been discussed.   The patient agrees to proceed.       [name] A. 07/11/2013, 4:29 PM

## 2013-07-11 NOTE — Patient Instructions (Signed)
Hernia Repair with Laparoscope A hernia occurs when an internal organ pushes out through a weak spot in the belly (abdominal) wall muscles. Hernias most commonly occur in the groin and around the navel. Hernias can also occur through a cut by the surgeon (incision) after an abdominal operation. A hernia may be caused by:  Lifting heavy objects.  Prolonged coughing.  Straining to move your bowels. Hernias can often be pushed back into place (reduced). Most hernias tend to get worse over time. Problems occur when abdominal contents get stuck in the opening and the blood supply is blocked or impaired (incarcerated hernia). Because of these risks, you require surgery to repair the hernia. Your hernia will be repaired using a laparoscope. Laparoscopic surgery is a type of minimally invasive surgery. It does not involve making a typical surgical cut (incision) in the skin. A laparoscope is a telescope-like rod and lens system. It is usually connected to a video camera and a light source so your caregiver can clearly see the operative area. The instruments are inserted through  to  inch (5 mm or 10 mm) openings in the skin at specific locations. A working and viewing space is created by blowing a small amount of carbon dioxide gas into the abdominal cavity. The abdomen is essentially blown up like a balloon (insufflated). This elevates the abdominal wall above the internal organs like a dome. The carbon dioxide gas is common to the human body and can be absorbed by tissue and removed by the respiratory system. Once the repair is completed, the small incisions will be closed with either stitches (sutures) or staples (just like a paper stapler only this staple holds the skin together). LET YOUR CAREGIVERS KNOW ABOUT:  Allergies.  Medications taken including herbs, eye drops, over the counter medications, and creams.  Use of steroids (by mouth or creams).  Previous problems with anesthetics or  Novocaine.  Possibility of pregnancy, if this applies.  History of blood clots (thrombophlebitis).  History of bleeding or blood problems.  Previous surgery.  Other health problems. BEFORE THE PROCEDURE  Laparoscopy can be done either in a hospital or out-patient clinic. You may be given a mild sedative to help you relax before the procedure. Once in the operating room, you will be given a general anesthesia to make you sleep (unless you and your caregiver choose a different anesthetic).  AFTER THE PROCEDURE  After the procedure you will be watched in a recovery area. Depending on what type of hernia was repaired, you might be admitted to the hospital or you might go home the same day. With this procedure you may have less pain and scarring. This usually results in a quicker recovery and less risk of infection. HOME CARE INSTRUCTIONS   Bed rest is not required. You may continue your normal activities but avoid heavy lifting (more than 10 pounds) or straining.  Cough gently. If you are a smoker it is best to stop, as even the best hernia repair can break down with the continual strain of coughing.  Avoid driving until given the OK by your surgeon.  There are no dietary restrictions unless given otherwise.  TAKE ALL MEDICATIONS AS DIRECTED.  Only take over-the-counter or prescription medicines for pain, discomfort, or fever as directed by your caregiver. SEEK MEDICAL CARE IF:   There is increasing abdominal pain or pain in your incisions.  There is more bleeding from incisions, other than minimal spotting.  You feel light headed or faint.  You   develop an unexplained fever, chills, and/or an oral temperature above 102 F (38.9 C).  You have redness, swelling, or increasing pain in the wound.  Pus coming from wound.  A foul smell coming from the wound or dressings. SEEK IMMEDIATE MEDICAL CARE IF:   You develop a rash.  You have difficulty breathing.  You have any  allergic problems. MAKE SURE YOU:   Understand these instructions.  Will watch your condition.  Will get help right away if you are not doing well or get worse. Document Released: 09/08/2005 Document Revised: 12/01/2011 Document Reviewed: 08/08/2009 [facility] Patient Information 2014 [place], [place]. Constipation, Adult Constipation is when a person has fewer than 3 bowel movements a week; has difficulty having a bowel movement; or has stools that are dry, hard, or larger than normal. As people grow older, constipation is more common. If you try to fix constipation with medicines that make you have a bowel movement (laxatives), the problem may get worse. Long-term laxative use may cause the muscles of the colon to become weak. A low-fiber diet, not taking in enough fluids, and taking certain medicines may make constipation worse. CAUSES   Certain medicines, such as antidepressants, pain medicine, iron supplements, antacids, and water pills.   Certain diseases, such as diabetes, irritable bowel syndrome (IBS), thyroid disease, or depression.   Not drinking enough water.   Not eating enough fiber-rich foods.   Stress or travel.  Lack of physical activity or exercise.  Not going to the restroom when there is the urge to have a bowel movement.  Ignoring the urge to have a bowel movement.  Using laxatives too much. SYMPTOMS   Having fewer than 3 bowel movements a week.   Straining to have a bowel movement.   Having hard, dry, or larger than normal stools.   Feeling full or bloated.   Pain in the lower abdomen.  Not feeling relief after having a bowel movement. DIAGNOSIS  Your caregiver will take a medical history and perform a physical exam. Further testing may be done for severe constipation. Some tests may include:   A barium enema X-ray to examine your rectum, colon, and sometimes, your small intestine.  A sigmoidoscopy to examine your lower colon.  A  colonoscopy to examine your entire colon. TREATMENT  Treatment will depend on the severity of your constipation and what is causing it. Some dietary treatments include drinking more fluids and eating more fiber-rich foods. Lifestyle treatments may include regular exercise. If these diet and lifestyle recommendations do not help, your caregiver may recommend taking over-the-counter laxative medicines to help you have bowel movements. Prescription medicines may be prescribed if over-the-counter medicines do not work.  HOME CARE INSTRUCTIONS   Increase dietary fiber in your diet, such as fruits, vegetables, whole grains, and beans. Limit high-fat and processed sugars in your diet, such as [place] fries, hamburgers, cookies, candies, and soda.   A fiber supplement may be added to your diet if you cannot get enough fiber from foods.   Drink enough fluids to keep your urine clear or pale yellow.   Exercise regularly or as directed by your caregiver.   Go to the restroom when you have the urge to go. Do not hold it.  Only take medicines as directed by your caregiver. Do not take other medicines for constipation without talking to your caregiver first. SEEK IMMEDIATE MEDICAL CARE IF:   You have bright red blood in your stool.   Your constipation lasts for more  than 4 days or gets worse.   You have abdominal or rectal pain.   You have thin, pencil-like stools.  You have unexplained weight loss. MAKE SURE YOU:   Understand these instructions.  Will watch your condition.  Will get help right away if you are not doing well or get worse. Document Released: 06/06/2004 Document Revised: 12/01/2011 Document Reviewed: 08/12/2011 [facility] Patient Information 2014 [place], [place]. High-Fiber Diet Fiber is found in fruits, vegetables, and grains. A high-fiber diet encourages the addition of more whole grains, legumes, fruits, and vegetables in your diet. The recommended amount of fiber for  adult males is 38 g per day. For adult females, it is 25 g per day. Pregnant and lactating women should get 28 g of fiber per day. If you have a digestive or bowel problem, ask your caregiver for advice before adding high-fiber foods to your diet. Eat a variety of high-fiber foods instead of only a select few type of foods.  PURPOSE  To increase stool bulk.  To make bowel movements more regular to prevent constipation.  To lower cholesterol.  To prevent overeating. WHEN IS THIS DIET USED?  It may be used if you have constipation and hemorrhoids.  It may be used if you have uncomplicated diverticulosis (intestine condition) and irritable bowel syndrome.  It may be used if you need help with weight management.  It may be used if you want to add it to your diet as a protective measure against atherosclerosis, diabetes, and cancer. SOURCES OF FIBER  Whole-grain breads and cereals.  Fruits, such as apples, oranges, bananas, berries, prunes, and pears.  Vegetables, such as green peas, carrots, sweet potatoes, beets, broccoli, cabbage, spinach, and artichokes.  Legumes, such split peas, soy, lentils.  Almonds. FIBER CONTENT IN FOODS Starches and Grains / Dietary Fiber (g)  Cheerios, 1 cup / 3 g  Corn Flakes cereal, 1 cup / 0.7 g  Rice crispy treat cereal, 1 cup / 0.3 g  Instant oatmeal (cooked),  cup / 2 g  Frosted wheat cereal, 1 cup / 5.1 g  Brown, long-grain rice (cooked), 1 cup / 3.5 g  White, long-grain rice (cooked), 1 cup / 0.6 g  Enriched macaroni (cooked), 1 cup / 2.5 g Legumes / Dietary Fiber (g)  Baked beans (canned, plain, or vegetarian),  cup / 5.2 g  Kidney beans (canned),  cup / 6.8 g  Pinto beans (cooked),  cup / 5.5 g Breads and Crackers / Dietary Fiber (g)  Plain or honey graham crackers, 2 squares / 0.7 g  Saltine crackers, 3 squares / 0.3 g  Plain, salted pretzels, 10 pieces / 1.8 g  Whole-wheat bread, 1 slice / 1.9 g  White bread, 1  slice / 0.7 g  Raisin bread, 1 slice / 1.2 g  Plain bagel, 3 oz / 2 g  Flour tortilla, 1 oz / 0.9 g  Corn tortilla, 1 small / 1.5 g  Hamburger or hotdog bun, 1 small / 0.9 g Fruits / Dietary Fiber (g)  Apple with skin, 1 medium / 4.4 g  Sweetened applesauce,  cup / 1.5 g  Banana,  medium / 1.5 g  Grapes, 10 grapes / 0.4 g  Orange, 1 small / 2.3 g  Raisin, 1.5 oz / 1.6 g  Melon, 1 cup / 1.4 g Vegetables / Dietary Fiber (g)  Green beans (canned),  cup / 1.3 g  Carrots (cooked),  cup / 2.3 g  Broccoli (cooked),  cup /  2.8 g  Peas (cooked),  cup / 4.4 g  Mashed potatoes,  cup / 1.6 g  Lettuce, 1 cup / 0.5 g  Corn (canned),  cup / 1.6 g  Tomato,  cup / 1.1 g Document Released: 09/08/2005 Document Revised: 03/09/2012 Document Reviewed: 12/11/2011 [facility] Patient Information 2014 [place], [place].   MIRALAX 17 GRAMS TWICE A DAY 64 OUNCES OF WATER A DAY NO SODAS WALK DAILY

## 2013-07-12 ENCOUNTER — Telehealth (INDEPENDENT_AMBULATORY_CARE_PROVIDER_SITE_OTHER): Payer: Self-pay

## 2013-07-12 NOTE — Telephone Encounter (Signed)
Patient called to get clarification of bowel prep before surgery. Advised patient Dr. Luisa Hart has ordered for him to start Miralax twice a day and to increase his fiber in his diet. Patient verbalizes understanding

## 2013-07-15 ENCOUNTER — Encounter (INDEPENDENT_AMBULATORY_CARE_PROVIDER_SITE_OTHER): Payer: Self-pay

## 2013-08-01 ENCOUNTER — Encounter (HOSPITAL_COMMUNITY): Payer: Self-pay | Admitting: Pharmacy Technician

## 2013-08-05 ENCOUNTER — Other Ambulatory Visit (HOSPITAL_COMMUNITY): Payer: Self-pay | Admitting: *Deleted

## 2013-08-05 NOTE — Pre-Procedure Instructions (Addendum)
Kyle Davidson  08/05/2013   Your procedure is scheduled on:  Tuesday, August 16, 2013    Report to [facility] Entrance "A" at 7:00 AM.   Call this number if you have problems the morning of surgery: [phone]   Remember:   Do not eat food or drink liquids after midnight Monday, 08/15/13.   Take these medicines the morning of surgery with A SIP OF WATER:  pantoprazole (PROTONIX), acetaminophen (TYLENOL) - if needed.  Stop all Vitamins, Herbal Medications, Fish Oil, Aspirin and NSAIDS (Aleve) as of 08/09/13.      Do not wear jewelry.  Do not wear lotions, powders, or cologne. You may wear deodorant.  Do not shave 48 hours prior to surgery. Men may shave face and neck.  Do not bring valuables to the hospital.  [facility] is not responsible                  for any belongings or valuables.               Contacts, dentures or bridgework may not be worn into surgery.  Leave suitcase in the car. After surgery it may be brought to your room.  For patients admitted to the hospital, discharge time is determined by your                treatment team.               Patients discharged the day of surgery will not be allowed to drive  home.  Name and phone number of your driver: Family/frien   Special Instructions: Shower using CHG 2 nights before surgery and the night before surgery.  If you shower the day of surgery use CHG.  Use special wash - you have one bottle of CHG for all showers.  You should use approximately 1/3 of the bottle for each shower.   Please read over the following fact sheets that you were given: Pain Booklet, Coughing and Deep Breathing and Surgical Site Infection Prevention

## 2013-08-08 ENCOUNTER — Encounter (HOSPITAL_COMMUNITY)
Admission: RE | Admit: 2013-08-08 | Discharge: 2013-08-08 | Disposition: A | Discharge status: Home / Self Care | Payer: BC Managed Care – PPO | Source: Ambulatory Visit | Attending: Surgery | Admitting: Surgery

## 2013-08-08 ENCOUNTER — Other Ambulatory Visit (INDEPENDENT_AMBULATORY_CARE_PROVIDER_SITE_OTHER): Payer: Self-pay | Admitting: Surgery

## 2013-08-08 ENCOUNTER — Telehealth (INDEPENDENT_AMBULATORY_CARE_PROVIDER_SITE_OTHER): Payer: Self-pay | Admitting: General Surgery

## 2013-08-08 ENCOUNTER — Encounter (HOSPITAL_COMMUNITY): Payer: Self-pay

## 2013-08-08 DIAGNOSIS — Z01812 Encounter for preprocedural laboratory examination: Secondary | ICD-10-CM | POA: Insufficient documentation

## 2013-08-08 DIAGNOSIS — Z01818 Encounter for other preprocedural examination: Secondary | ICD-10-CM | POA: Insufficient documentation

## 2013-08-08 HISTORY — DX: Cardiac murmur, unspecified: R01.1

## 2013-08-08 HISTORY — DX: Personal history of urinary calculi: Z87.442

## 2013-08-08 HISTORY — DX: Other specified postprocedural states: Z98.890

## 2013-08-08 HISTORY — DX: Malignant (primary) neoplasm, unspecified: C80.1

## 2013-08-08 HISTORY — DX: Nausea with vomiting, unspecified: R11.2

## 2013-08-08 HISTORY — DX: Depression, unspecified: F32.A

## 2013-08-08 LAB — CBC WITH DIFFERENTIAL/PLATELET
Basophils Absolute: 0 10*3/uL (ref 0.0–0.1)
HCT: 48.5 % (ref 39.0–52.0)
Lymphocytes Relative: 34 % (ref 12–46)
Lymphs Abs: 2.1 10*3/uL (ref 0.7–4.0)
Monocytes Absolute: 0.5 10*3/uL (ref 0.1–1.0)
Neutro Abs: 3.1 10*3/uL (ref 1.7–7.7)
RBC: 5.35 MIL/uL (ref 4.22–5.81)
RDW: 13.2 % (ref 11.5–15.5)
WBC: 6.2 10*3/uL (ref 4.0–10.5)

## 2013-08-08 LAB — COMPREHENSIVE METABOLIC PANEL
ALT: 28 U/L (ref 0–53)
AST: 29 U/L (ref 0–37)
CO2: 24 mEq/L (ref 19–32)
Chloride: 107 mEq/L (ref 96–112)
Creatinine, Ser: 0.92 mg/dL (ref 0.50–1.35)
GFR calc non Af Amer: 89 mL/min — ABNORMAL LOW (ref >90)
Glucose, Bld: 104 mg/dL — ABNORMAL HIGH (ref 70–99)
Sodium: 142 mEq/L (ref 135–145)
Total Bilirubin: 1 mg/dL (ref 0.3–1.2)
Total Protein: 7.7 g/dL (ref 6.0–8.3)

## 2013-08-08 NOTE — Telephone Encounter (Signed)
Kyle Davidson with [facility] Short [facility] called and states the orders we have for surgery do not match consent. Please add umbilical hernia repair to consent.

## 2013-08-08 NOTE — Progress Notes (Addendum)
Office called re: patient understands to repair umbilical hernia as well as bilateral hernias.spoke with [place]. To convey message to dr cornett to add to consent.

## 2013-08-15 MED ORDER — CHLORHEXIDINE GLUCONATE 4 % EX LIQD: 1.0000 "application " | Freq: Once | CUTANEOUS | Status: DC

## 2013-08-15 MED ORDER — DEXTROSE 5 % IV SOLN
3.0000 g | INTRAVENOUS | Status: AC
  Administered 2013-08-16: 3 g via INTRAVENOUS
  Filled 2013-08-15: qty 3000

## 2013-08-16 ENCOUNTER — Encounter (HOSPITAL_COMMUNITY)
Admission: RE | Disposition: A | Discharge status: Home / Self Care | Payer: Self-pay | Source: Ambulatory Visit | Attending: Surgery

## 2013-08-16 ENCOUNTER — Ambulatory Visit (HOSPITAL_COMMUNITY)
Admission: RE | Admit: 2013-08-16 | Discharge: 2013-08-16 | Disposition: A | Discharge status: Home / Self Care | Payer: BC Managed Care – PPO | Source: Ambulatory Visit | Attending: Surgery | Admitting: Surgery

## 2013-08-16 ENCOUNTER — Ambulatory Visit (HOSPITAL_COMMUNITY): Payer: BC Managed Care – PPO | Admitting: Anesthesiology

## 2013-08-16 ENCOUNTER — Encounter (HOSPITAL_COMMUNITY): Payer: BC Managed Care – PPO | Admitting: Anesthesiology

## 2013-08-16 DIAGNOSIS — D176 Benign lipomatous neoplasm of spermatic cord: Secondary | ICD-10-CM | POA: Insufficient documentation

## 2013-08-16 DIAGNOSIS — K429 Umbilical hernia without obstruction or gangrene: Secondary | ICD-10-CM | POA: Insufficient documentation

## 2013-08-16 DIAGNOSIS — K219 Gastro-esophageal reflux disease without esophagitis: Secondary | ICD-10-CM | POA: Insufficient documentation

## 2013-08-16 DIAGNOSIS — K4020 Bilateral inguinal hernia, without obstruction or gangrene, not specified as recurrent: Secondary | ICD-10-CM | POA: Insufficient documentation

## 2013-08-16 DIAGNOSIS — N200 Calculus of kidney: Secondary | ICD-10-CM | POA: Insufficient documentation

## 2013-08-16 DIAGNOSIS — K389 Disease of appendix, unspecified: Secondary | ICD-10-CM | POA: Insufficient documentation

## 2013-08-16 DIAGNOSIS — M129 Arthropathy, unspecified: Secondary | ICD-10-CM | POA: Insufficient documentation

## 2013-08-16 DIAGNOSIS — N509 Disorder of male genital organs, unspecified: Secondary | ICD-10-CM | POA: Insufficient documentation

## 2013-08-16 DIAGNOSIS — K5900 Constipation, unspecified: Secondary | ICD-10-CM

## 2013-08-16 DIAGNOSIS — J329 Chronic sinusitis, unspecified: Secondary | ICD-10-CM

## 2013-08-16 HISTORY — PX: INGUINAL HERNIA REPAIR: SHX194

## 2013-08-16 HISTORY — PX: INSERTION OF MESH: SHX5868

## 2013-08-16 SURGERY — REPAIR, HERNIA, INGUINAL, LAPAROSCOPIC
Anesthesia: General | Site: Abdomen | Laterality: Bilateral | Wound class: Clean

## 2013-08-16 MED ORDER — ONDANSETRON HCL 4 MG/2ML IJ SOLN
4.0000 mg | Freq: Once | INTRAMUSCULAR | Status: AC | PRN
  Administered 2013-08-16: 4 mg via INTRAVENOUS

## 2013-08-16 MED ORDER — PROPOFOL 10 MG/ML IV BOLUS
INTRAVENOUS | Status: DC | PRN
  Administered 2013-08-16: 200 mg via INTRAVENOUS

## 2013-08-16 MED ORDER — KETOROLAC TROMETHAMINE 30 MG/ML IJ SOLN
INTRAMUSCULAR | Status: DC | PRN
  Administered 2013-08-16: 30 mg via INTRAVENOUS

## 2013-08-16 MED ORDER — ONDANSETRON HCL 4 MG/2ML IJ SOLN
4.0000 mg | Freq: Four times a day (QID) | INTRAMUSCULAR | Status: DC | PRN
  Filled 2013-08-16: qty 2

## 2013-08-16 MED ORDER — SODIUM CHLORIDE 0.9 % IR SOLN
Status: DC | PRN
  Administered 2013-08-16: 1000 mL

## 2013-08-16 MED ORDER — ACETAMINOPHEN 650 MG RE SUPP
650.0000 mg | RECTAL | Status: DC | PRN
  Filled 2013-08-16: qty 1

## 2013-08-16 MED ORDER — EVICEL 5 ML EX KIT
PACK | CUTANEOUS | Status: AC
  Filled 2013-08-16: qty 1

## 2013-08-16 MED ORDER — ACETAMINOPHEN 325 MG PO TABS
650.0000 mg | ORAL_TABLET | ORAL | Status: DC | PRN
  Filled 2013-08-16: qty 2

## 2013-08-16 MED ORDER — ONDANSETRON HCL 4 MG/2ML IJ SOLN
INTRAMUSCULAR | Status: AC
  Administered 2013-08-16: 4 mg via INTRAVENOUS
  Filled 2013-08-16: qty 2

## 2013-08-16 MED ORDER — HYDROMORPHONE HCL PF 1 MG/ML IJ SOLN
0.2500 mg | INTRAMUSCULAR | Status: DC | PRN
  Administered 2013-08-16 (×2): 0.5 mg via INTRAVENOUS

## 2013-08-16 MED ORDER — VECURONIUM BROMIDE 10 MG IV SOLR
INTRAVENOUS | Status: DC | PRN
  Administered 2013-08-16 (×4): 2 mg via INTRAVENOUS

## 2013-08-16 MED ORDER — TRAMADOL HCL 50 MG PO TABS: 50.0000 mg | ORAL_TABLET | Freq: Four times a day (QID) | ORAL | Status: DC | PRN

## 2013-08-16 MED ORDER — KETOROLAC TROMETHAMINE 30 MG/ML IJ SOLN
15.0000 mg | Freq: Once | INTRAMUSCULAR | Status: AC | PRN
  Administered 2013-08-16: 30 mg via INTRAVENOUS

## 2013-08-16 MED ORDER — SODIUM CHLORIDE 0.9 % IJ SOLN: 3.0000 mL | Freq: Two times a day (BID) | INTRAMUSCULAR | Status: DC

## 2013-08-16 MED ORDER — KETOROLAC TROMETHAMINE 15 MG/ML IJ SOLN
15.0000 mg | Freq: Four times a day (QID) | INTRAMUSCULAR | Status: DC
  Filled 2013-08-16 (×4): qty 1

## 2013-08-16 MED ORDER — DEXAMETHASONE SODIUM PHOSPHATE 4 MG/ML IJ SOLN
INTRAMUSCULAR | Status: DC | PRN
  Administered 2013-08-16: 8 mg via INTRAVENOUS

## 2013-08-16 MED ORDER — ROCURONIUM BROMIDE 100 MG/10ML IV SOLN
INTRAVENOUS | Status: DC | PRN
  Administered 2013-08-16: 50 mg via INTRAVENOUS

## 2013-08-16 MED ORDER — FENTANYL CITRATE 0.05 MG/ML IJ SOLN
INTRAMUSCULAR | Status: DC | PRN
  Administered 2013-08-16: 50 ug via INTRAVENOUS
  Administered 2013-08-16: 100 ug via INTRAVENOUS
  Administered 2013-08-16: 50 ug via INTRAVENOUS
  Administered 2013-08-16: 150 ug via INTRAVENOUS
  Administered 2013-08-16: 50 ug via INTRAVENOUS

## 2013-08-16 MED ORDER — NEOSTIGMINE METHYLSULFATE 1 MG/ML IJ SOLN
INTRAMUSCULAR | Status: DC | PRN
  Administered 2013-08-16: 4 mg via INTRAVENOUS

## 2013-08-16 MED ORDER — SCOPOLAMINE 1 MG/3DAYS TD PT72
MEDICATED_PATCH | TRANSDERMAL | Status: DC | PRN
  Administered 2013-08-16: 1 via TRANSDERMAL

## 2013-08-16 MED ORDER — ONDANSETRON HCL 4 MG/2ML IJ SOLN
INTRAMUSCULAR | Status: DC | PRN
  Administered 2013-08-16: 4 mg via INTRAVENOUS

## 2013-08-16 MED ORDER — HYDROMORPHONE HCL PF 1 MG/ML IJ SOLN
INTRAMUSCULAR | Status: AC
  Filled 2013-08-16: qty 1

## 2013-08-16 MED ORDER — LACTATED RINGERS IV SOLN
INTRAVENOUS | Status: DC
  Administered 2013-08-16: 07:00:00 via INTRAVENOUS

## 2013-08-16 MED ORDER — FENTANYL CITRATE 0.05 MG/ML IJ SOLN
INTRAMUSCULAR | Status: AC
  Filled 2013-08-16: qty 2

## 2013-08-16 MED ORDER — SODIUM CHLORIDE 0.9 % IV SOLN: 250.0000 mL | INTRAVENOUS | Status: DC | PRN

## 2013-08-16 MED ORDER — LIDOCAINE HCL (CARDIAC) 20 MG/ML IV SOLN
INTRAVENOUS | Status: DC | PRN
  Administered 2013-08-16: 40 mg via INTRAVENOUS

## 2013-08-16 MED ORDER — OXYCODONE-ACETAMINOPHEN 5-325 MG PO TABS: 1.0000 | ORAL_TABLET | ORAL | Status: DC | PRN

## 2013-08-16 MED ORDER — KETOROLAC TROMETHAMINE 30 MG/ML IJ SOLN
INTRAMUSCULAR | Status: AC
  Filled 2013-08-16: qty 1

## 2013-08-16 MED ORDER — SODIUM CHLORIDE 0.9 % IJ SOLN: 3.0000 mL | INTRAMUSCULAR | Status: DC | PRN

## 2013-08-16 MED ORDER — MIDAZOLAM HCL 5 MG/5ML IJ SOLN
INTRAMUSCULAR | Status: DC | PRN
  Administered 2013-08-16: 2 mg via INTRAVENOUS

## 2013-08-16 MED ORDER — OXYCODONE HCL 5 MG PO TABS
ORAL_TABLET | ORAL | Status: AC
  Filled 2013-08-16: qty 2

## 2013-08-16 MED ORDER — FENTANYL CITRATE 0.05 MG/ML IJ SOLN
25.0000 ug | INTRAMUSCULAR | Status: DC | PRN
  Administered 2013-08-16 (×2): 50 ug via INTRAVENOUS

## 2013-08-16 MED ORDER — EVICEL 5 ML EX KIT
PACK | CUTANEOUS | Status: DC | PRN
  Administered 2013-08-16 (×2): 1

## 2013-08-16 MED ORDER — BUPIVACAINE-EPINEPHRINE 0.25% -1:200000 IJ SOLN
INTRAMUSCULAR | Status: DC | PRN
  Administered 2013-08-16: 9 mL

## 2013-08-16 MED ORDER — GLYCOPYRROLATE 0.2 MG/ML IJ SOLN
INTRAMUSCULAR | Status: DC | PRN
  Administered 2013-08-16: .8 mg via INTRAVENOUS

## 2013-08-16 MED ORDER — LACTATED RINGERS IV SOLN
INTRAVENOUS | Status: DC | PRN
  Administered 2013-08-16 (×2): via INTRAVENOUS

## 2013-08-16 MED ORDER — OXYCODONE HCL 5 MG PO TABS
5.0000 mg | ORAL_TABLET | ORAL | Status: DC | PRN
  Administered 2013-08-16: 10 mg via ORAL

## 2013-08-16 MED ORDER — 0.9 % SODIUM CHLORIDE (POUR BTL) OPTIME
TOPICAL | Status: DC | PRN
  Administered 2013-08-16: 1000 mL

## 2013-08-16 MED ORDER — BUPIVACAINE-EPINEPHRINE (PF) 0.25% -1:200000 IJ SOLN
INTRAMUSCULAR | Status: AC
  Filled 2013-08-16: qty 30

## 2013-08-16 SURGICAL SUPPLY — 42 items
APPLIER CLIP LOGIC TI 5 (MISCELLANEOUS) IMPLANT
BLADE SURG ROTATE 9660 (MISCELLANEOUS) ×4 IMPLANT
CANISTER SUCTION 2500CC (MISCELLANEOUS) IMPLANT
CHLORAPREP W/TINT 26ML (MISCELLANEOUS) ×2 IMPLANT
COVER SURGICAL LIGHT HANDLE (MISCELLANEOUS) ×2 IMPLANT
DERMABOND ADVANCED (GAUZE/BANDAGES/DRESSINGS) ×1
DERMABOND ADVANCED .7 DNX12 (GAUZE/BANDAGES/DRESSINGS) ×1 IMPLANT
DEVICE SECURE STRAP 25 ABSORB (INSTRUMENTS) ×2 IMPLANT
DISSECT BALLN SPACEMKR + OVL (BALLOONS) ×4
DISSECTOR BALLN SPACEMKR + OVL (BALLOONS) ×2 IMPLANT
DISSECTOR BLUNT TIP ENDO 5MM (MISCELLANEOUS) ×2 IMPLANT
DRAPE UTILITY 15X26 W/TAPE STR (DRAPE) ×4 IMPLANT
DRAPE WARM FLUID 44X44 (DRAPE) ×2 IMPLANT
ELECT REM PT RETURN 9FT ADLT (ELECTROSURGICAL) ×2
ELECTRODE REM PT RTRN 9FT ADLT (ELECTROSURGICAL) ×1 IMPLANT
GLOVE BIO SURGEON STRL SZ7.5 (GLOVE) ×2 IMPLANT
GLOVE BIO SURGEON STRL SZ8 (GLOVE) ×2 IMPLANT
GLOVE BIOGEL PI IND STRL 7.0 (GLOVE) ×1 IMPLANT
GLOVE BIOGEL PI IND STRL 7.5 (GLOVE) ×1 IMPLANT
GLOVE BIOGEL PI IND STRL 8 (GLOVE) ×1 IMPLANT
GLOVE BIOGEL PI INDICATOR 7.0 (GLOVE) ×1
GLOVE BIOGEL PI INDICATOR 7.5 (GLOVE) ×1
GLOVE BIOGEL PI INDICATOR 8 (GLOVE) ×1
GLOVE SURG SS PI 7.0 STRL IVOR (GLOVE) ×6 IMPLANT
GOWN STRL NON-REIN LRG LVL3 (GOWN DISPOSABLE) ×4 IMPLANT
GOWN STRL REIN XL XLG (GOWN DISPOSABLE) ×2 IMPLANT
KIT BASIN OR (CUSTOM PROCEDURE TRAY) ×2 IMPLANT
KIT ROOM TURNOVER OR (KITS) ×2 IMPLANT
MESH ULTRAPRO 3X6 7.6X15CM (Mesh General) ×4 IMPLANT
NS IRRIG 1000ML POUR BTL (IV SOLUTION) ×2 IMPLANT
PAD ARMBOARD 7.5X6 YLW CONV (MISCELLANEOUS) ×4 IMPLANT
SET IRRIG TUBING LAPAROSCOPIC (IRRIGATION / IRRIGATOR) ×2 IMPLANT
SUT MNCRL AB 4-0 PS2 18 (SUTURE) ×2 IMPLANT
SUT NOVA NAB DX-16 0-1 5-0 T12 (SUTURE) ×2 IMPLANT
TIP RIGID 35CM EVICEL (HEMOSTASIS) ×4 IMPLANT
TOWEL OR 17X24 6PK STRL BLUE (TOWEL DISPOSABLE) IMPLANT
TOWEL OR 17X26 10 PK STRL BLUE (TOWEL DISPOSABLE) ×2 IMPLANT
TRAY FOLEY CATH 16FR SILVER (SET/KITS/TRAYS/PACK) ×2 IMPLANT
TRAY LAPAROSCOPIC (CUSTOM PROCEDURE TRAY) ×2 IMPLANT
TROCAR XCEL BLADELESS 5X75MML (TROCAR) ×4 IMPLANT
TUBING INSUFFLATOR W/FILTER (MISCELLANEOUS) ×2 IMPLANT
WATER STERILE IRR 1000ML POUR (IV SOLUTION) IMPLANT

## 2013-08-16 NOTE — Op Note (Addendum)
Preop diagnosis: Inguinal hernia Bilateral  And  Umbilical hernia  Postop diagnosis: Same  Procedure:  Bilateral Laparoscopic preperitoneal inguinal hernia repair with ultrapro mesh 3 inch x 6 inch and closure umbilical hernia primary   Surgeon: Harriette Bouillon M.D.  Anesthesia Gen. endotracheal anesthesia with local 0.25%  bupivicaine with epinephrine   EBL: Minimal less than 20 cc  Specimen: None  Drains: None  Indications for procedure: The patient presents with umbilical and  bilateral  inguinal hernia. Operative and nonoperative treatment were discussed in the office. Risks, benefits and alternative therapies were discussed. Both open and laparoscopic approaches to repair with the use of mesh were discussed.The risk of hernia repair include bleeding,  Infection,   Recurrence of the hernia,  Mesh use, chronic pain,  Organ injury,  Bowel injury,  Bladder injury,   nerve injury with numbness around the incision,  Death,  and worsening of preexisting  medical problems.  The alternatives to surgery have been discussed as well..  Long term expectations of both operative and non operative treatments have been discussed.   The patient agrees to proceed.   Description of procedure: The patient was seen in the holding area. All questions were answered. They wish to proceed. The patient was taken to the operating room. They were placed supine on the operating room table. After induction of general anesthesia, a Foley catheter was placed without difficulty under sterile conditions. Timeout was done. The lower abdomen and groin regions were prepped and draped in a sterile fashion to include the scrotum. Preop antibiotics given. 1 cm periumbilical incision made and dissection carried down to the midline fascia. A 5 mm umbilical hernia was identified and closed with 1 0 novafil suture without difficulty.   Anterior rectus sheath opened and fibers were retracted laterally exposing the posterior rectus sheath.  Balloon trocar inserted below the rectus muscle and directed toward the pubic symphysis. Once contact may be pubic symphysis, trocar pulled back about 2 cm and balloon inflated in the pre-peritoneal space under direct vision. The balloon was then removed and the trocar left in place. The preperitoneal space was insufflated to 15 mmHg CO2 pressure. Upon inspection there is no active bleeding of the pre-peritoneal space. Two 5 mm ports were placed under direct vision in the midline. The spermatic cord was dissected out circumferentially on the right. The small direct defect was identified.Large cord lipoma reduced.  The space anterior to the bladder was dissected carefully taking care not to injure the bladder. Ultrapro mesh was then inserted using a 3 inch 6 inch piece to cover the defect widely. The mesh was held in place with biologic glue Evecel and absorbable tac in Cooper's ligament.  hemostasis was achieved. The same was done to the left side where small direct defect noted with large cord lipoma that was reduced. Evecel and absorbable tac used to secure mesh.  This was watched for 5 min to allow the glue to set up.  Hemostasis achieved.   The bladder,  Iliac vessels and spermatic cord were inspected and felt to be intact.  There appeared to be no violation of the peritoneum.   The trocars were then removed allowing the space to collapse under direct vision holding the mesh in place. The umbilical port was removed and fascia closed with 0 Vicryl 4-0 Monocryl used to close the skin. Dermabond applied to incisions. Foley catheter was removed and the urine was clear. All counts the sponge, needles and instruments are correct. The patient  was extubated taken to recovery in satisfactory condition

## 2013-08-16 NOTE — H&P (Signed)
Kyle Davidson  07/11/2013 3:30 PM   Office Visit  MRN:  [id number]   Description: 62 year old male  Provider: Clovis Pu. Kolbie Lepkowski, MD  Department: [facility]        Diagnoses    Bilateral inguinal hernia    -  Primary    550.92    Umbilical hernia        553.1    Unspecified constipation        564.00      Reason for Visit    New Evaluation    eval bilateral ing hernias        Current Vitals - Last Recorded    BP Pulse Temp(Src) Resp Ht Wt    140/92 80 98.2 F (36.8 C) (Temporal) 16 6\' 3"  (1.905 m) 228 lb 12.8 oz (103.783 kg)       BMI              28.60 kg/m2                 Progress Notes    Kyle Mccauslin A. Cherrelle Plante, MD at 07/11/2013  4:29 PM    Status: Signed            Patient ID: Kyle Davidson, male   DOB: 21-Jul-1951, 62 y.o.   MRN: [id number]    Chief Complaint   Patient presents with   .  New Evaluation       eval bilateral ing hernias        HPI Kyle Davidson is a 62 y.o. male.  Patient sent at the request of Dr. Herb Grays with multiple complaints today. He complains of head and face pain as well as left ear pain. He complains of abdominal pain especially under his left rib cage, constipation, green stool, and bilateral inguinal hernia. He has been evaluated with CT scanning which shows multiple right renal calculi nonobstructing, bilateral inguinal hernia, an appendicolith which is noninflamed. Mostly of his abdominal pain is left upper quadrant sharp especially when he turns but not at rest. He has problems with constipation and prolonged sitting on the commode and has a bowel movement every 3 days or so which is hard. He does a lot of straining. No blood in his urine. Hemoccult positive test he states the doctors office but normal colonoscopy 2 years ago. No visible blood when he has a bowel movement. HPI    Past Medical History   Diagnosis  Date   .  GERD (gastroesophageal reflux disease)     .  Vertebral fracture         x2 neck area   .   Arthritis           Past Surgical History   Procedure  Laterality  Date   .  Back surgery       .  Shoulder arthroscopy       .  Neck surgery             Family History   Problem  Relation  Age of Onset   .  Cancer  Father         bladder & prostate        Social History History   Substance Use Topics   .  Smoking status:  Never Smoker    .  Smokeless tobacco:  Never Used   .  Alcohol Use:  No         Allergies   Allergen  Reactions   .  Codeine  Other (See Comments)       Bad headaches.....makes him see things         Current Outpatient Prescriptions   Medication  Sig  Dispense  Refill   .  acetaminophen (TYLENOL) 500 MG tablet  Take 1,500 mg by mouth every 6 (six) hours as needed. pain         .  omeprazole (PRILOSEC) 10 MG capsule  Take 40 mg by mouth 2 (two) times daily.             No current facility-administered medications for this visit.        Review of Systems Review of Systems  Constitutional: Negative for fatigue.  HENT: Positive for congestion and ear pain.   Eyes: Negative.   Respiratory: Negative.   Gastrointestinal: Positive for abdominal pain, blood in stool and abdominal distention.  Genitourinary: Positive for testicular pain.  Musculoskeletal: Positive for back pain.  Neurological: Negative.   Hematological: Negative.   Psychiatric/Behavioral: Negative.       Blood pressure 140/92, pulse 80, temperature 98.2 F (36.8 C), temperature source Temporal, resp. rate 16, height 6\' 3"  (1.905 m), weight 228 lb 12.8 oz (103.783 kg).   Physical Exam Physical Exam  Constitutional: He is oriented to person, place, and time. He appears well-developed and well-nourished.  HENT:   Head: Normocephalic and atraumatic.  Eyes: EOM are normal. Pupils are equal, round, and reactive to light.  Neck: Normal range of motion. Neck supple.  Cardiovascular: Normal rate and regular rhythm.   Pulmonary/Chest: Effort normal and breath sounds  normal.  Abdominal: He exhibits distension. A hernia is present. Hernia confirmed positive in the right inguinal area and confirmed positive in the left inguinal area.    Genitourinary:     Musculoskeletal: Normal range of motion.  Neurological: He is alert and oriented to person, place, and time.  Skin: Skin is warm.  Psychiatric: He has a normal mood and affect. His behavior is normal. Thought content normal.      Data Reviewed Ct A/P NOVANT  Scan shows right-sided kidney stones without hydronephrosis an appendicolith noninflamed bilaterally inguinal hernia   Assessment    Bilateral inguinal hernia reducible   Chronic constipation   Kidney stones right without obstruction   Epididymal cyst left scrotum     Plan    The patient has multiple complaints and problems. I explained to him that his inguinal hernias are not causing his head and ear problems. He does have what sounds like constipation and now start him on MiraLAX twice a day, high fiber diet and have given instructions for that. I do not feel the appendicolith is causing his abdominal complaints at this time and can be followed. Recommend laparoscopic repair bilateral inguinal hernia.The risk of hernia repair include bleeding,  Infection,   Recurrence of the hernia,  Mesh use, chronic pain,  Organ injury,  Bowel injury,  Bladder injury,   nerve injury with numbness around the incision,  Death,  and worsening of preexisting  medical problems. The alternatives to surgery have been discussed as well..  Long term expectations of both operative and non operative treatments have been discussed.   The patient agrees to proceed.          Aneta Hendershott A.

## 2013-08-16 NOTE — Anesthesia Procedure Notes (Signed)
Procedure Name: Intubation Date/Time: 08/16/2013 9:16 AM Performed by: Whitman Hero Pre-anesthesia Checklist: Patient identified, Emergency Drugs available, Suction available and Patient being monitored Patient Re-evaluated:Patient Re-evaluated prior to inductionOxygen Delivery Method: Circle system utilized Preoxygenation: Pre-oxygenation with 100% oxygen Intubation Type: IV induction Ventilation: Oral airway inserted - appropriate to patient size and Mask ventilation without difficulty Laryngoscope Size: Mac and 3 Grade View: Grade I Tube type: Oral Tube size: 7.5 mm Number of attempts: 1 Airway Equipment and Method: Stylet Placement Confirmation: ETT inserted through vocal cords under direct vision,  positive ETCO2 and breath sounds checked- equal and bilateral Secured at: 23 cm Tube secured with: Tape Dental Injury: Teeth and Oropharynx as per pre-operative assessment

## 2013-08-16 NOTE — OR Nursing (Signed)
Bladder scan x 3 sttaempts = @ 

## 2013-08-16 NOTE — Anesthesia Postprocedure Evaluation (Signed)
  Anesthesia Post-op Note  Patient: Kyle Davidson  Procedure(s) Performed: Procedure(s): LAPAROSCOPIC BILATERAL INGUINAL HERNIA WITH UMBILICAL HERNIA (Bilateral) INSERTION OF MESH (Bilateral)  Patient Location: PACU  Anesthesia Type:General  Level of Consciousness: awake, alert  and oriented  Airway and Oxygen Therapy: Patient Spontanous Breathing and Patient connected to nasal cannula oxygen  Post-op Pain: mild  Post-op Assessment: Post-op Vital signs reviewed, Patient's Cardiovascular Status Stable, Respiratory Function Stable, Patent Airway and Pain level controlled  Post-op Vital Signs: stable  Complications: No apparent anesthesia complications

## 2013-08-16 NOTE — Interval H&P Note (Signed)
History and Physical Interval Note:  08/16/2013 8:33 AM  Kyle Davidson  has presented today for surgery, with the diagnosis of hernia  The various methods of treatment have been discussed with the patient and family. After consideration of risks, benefits and other options for treatment, the patient has consented to  Procedure(s): LAPAROSCOPIC BILATERAL INGUINAL HERNIA WITH UMBILICAL HERNIA (Bilateral) INSERTION OF MESH (Bilateral) as a surgical intervention .  The patient's history has been reviewed, patient examined, no change in status, stable for surgery.  I have reviewed the patient's chart and labs.  Questions were answered to the patient's satisfaction.     Sayre Mazor A.

## 2013-08-16 NOTE — Anesthesia Preprocedure Evaluation (Signed)
Anesthesia Evaluation  Patient identified by MRN, date of birth, ID band Patient awake    Reviewed: Allergy & Precautions, H&P , NPO status , Patient's Chart, lab work & pertinent test results  Airway Mallampati: II TM Distance: >3 FB Neck ROM: Full    Dental  (+) Teeth Intact and Dental Advisory Given   Pulmonary  breath sounds clear to auscultation        Cardiovascular Rhythm:Regular Rate:Normal     Neuro/Psych    GI/Hepatic   Endo/Other    Renal/GU      Musculoskeletal   Abdominal   Peds  Hematology   Anesthesia Other Findings   Reproductive/Obstetrics                           Anesthesia Physical Anesthesia Plan  ASA: II  Anesthesia Plan: General   Post-op Pain Management:    Induction: Intravenous  Airway Management Planned: LMA  Additional Equipment:   Intra-op Plan:   Post-operative Plan: Extubation in OR  Informed Consent: I have reviewed the patients History and Physical, chart, labs and discussed the procedure including the risks, benefits and alternatives for the proposed anesthesia with the patient or authorized representative who has indicated his/her understanding and acceptance.   Dental advisory given  Plan Discussed with: CRNA and Anesthesiologist  Anesthesia Plan Comments: (GERD Post-op nausea and vomiting  Plan GA with LMA  Kipp Brood, MD)        Anesthesia Quick Evaluation

## 2013-08-16 NOTE — Preoperative (Signed)
Beta Blockers   Reason not to administer Beta Blockers:Not Applicable 

## 2013-08-16 NOTE — Transfer of Care (Signed)
Immediate Anesthesia Transfer of Care Note  Patient: Kyle Davidson  Procedure(s) Performed: Procedure(s): LAPAROSCOPIC BILATERAL INGUINAL HERNIA WITH UMBILICAL HERNIA (Bilateral) INSERTION OF MESH (Bilateral)  Patient Location: PACU  Anesthesia Type:General  Level of Consciousness: awake, alert  and oriented  Airway & Oxygen Therapy: Patient Spontanous Breathing and Patient connected to face mask oxygen  Post-op Assessment: Report given to PACU RN and Post -op Vital signs reviewed and stable  Post vital signs: Reviewed and stable  Complications: No apparent anesthesia complications

## 2013-08-17 ENCOUNTER — Encounter (HOSPITAL_COMMUNITY): Payer: Self-pay | Admitting: Surgery

## 2013-08-22 ENCOUNTER — Ambulatory Visit (INDEPENDENT_AMBULATORY_CARE_PROVIDER_SITE_OTHER): Payer: BC Managed Care – PPO | Admitting: Surgery

## 2013-08-22 ENCOUNTER — Encounter (INDEPENDENT_AMBULATORY_CARE_PROVIDER_SITE_OTHER): Payer: Self-pay

## 2013-08-22 ENCOUNTER — Encounter (INDEPENDENT_AMBULATORY_CARE_PROVIDER_SITE_OTHER): Payer: Self-pay | Admitting: Surgery

## 2013-08-22 VITALS — BP 146/92 | HR 100 | Temp 98.9°F | Resp 16 | Ht 75.0 in | Wt 225.8 lb

## 2013-08-22 DIAGNOSIS — G8918 Other acute postprocedural pain: Secondary | ICD-10-CM | POA: Insufficient documentation

## 2013-08-22 MED ORDER — HYDROCODONE-ACETAMINOPHEN 5-325 MG PO TABS: 1.0000 | ORAL_TABLET | Freq: Four times a day (QID) | ORAL | Status: DC | PRN

## 2013-08-22 NOTE — Progress Notes (Signed)
Patient returns after laparoscopic bilateral inguinal hernia repair with mesh and primary repair of umbilical hernia. He is having more groin pain and left testicle pain. He has stopped taking his narcotic and is now on Tylenol do to him seeing things on oxycodone. He has poor pain control. He is 6 days out. No blood in urine or difficulty voiding.   Exam: Significant bruising of the scrotal sac and tender left testicle. Right testicle is tender 2. Bruising or incisions. Mild swelling of both inguinal canals.  Impression: Status post laparoscopic bilateral inguinal hernia repair of mesh and primary repair of umbilical hernia postop day 6 for pain control  Plan: Will switch to Norco 5/325 one to 2 tablets every 4 hours as needed for pain. Ice packs and heat as needed. Refrain from heavy physical activity for the next 2 weeks. Return in 2 weeks.

## 2013-08-22 NOTE — Patient Instructions (Signed)
HERNIA REPAIR: POST OP INSTRUCTIONS  1. DIET: Follow a light bland diet the first 24 hours after arrival home, such as soup, liquids, crackers, etc.  Be sure to include lots of fluids daily.  Avoid fast food or heavy meals as your are more likely to get nauseated.  Eat a low fat the next few days after surgery. 2. Take your usually prescribed home medications unless otherwise directed. 3. PAIN CONTROL: a. Pain is best controlled by a usual combination of three different methods TOGETHER: i. Ice/Heat ii. Over the counter pain medication iii. Prescription pain medication b. Most patients will experience some swelling and bruising around the hernia(s) such as the bellybutton, groins, or old incisions.  Ice packs or heating pads (30-60 minutes up to 6 times a day) will help. Use ice for the first few days to help decrease swelling and bruising, then switch to heat to help relax tight/sore spots and speed recovery.  Some people prefer to use ice alone, heat alone, alternating between ice & heat.  Experiment to what works for you.  Swelling and bruising can take several weeks to resolve.   c. It is helpful to take an over-the-counter pain medication regularly for the first few weeks.  Choose one of the following that works best for you: i. Naproxen (Aleve, etc)  Two 220mg tabs twice a day ii. Ibuprofen (Advil, etc) Three 200mg tabs four times a day (every meal & bedtime) iii. Acetaminophen (Tylenol, etc) 325-650mg four times a day (every meal & bedtime) d. A  prescription for pain medication should be given to you upon discharge.  Take your pain medication as prescribed.  i. If you are having problems/concerns with the prescription medicine (does not control pain, nausea, vomiting, rash, itching, etc), please call us [phone] to see if we need to switch you to a different pain medicine that will work better for you and/or control your side effect better. ii. If you need a refill on your pain  medication, please contact your pharmacy.  They will contact our office to request authorization. Prescriptions will not be filled after 5 pm or on week-ends. 4. Avoid getting constipated.  Between the surgery and the pain medications, it is common to experience some constipation.  Increasing fluid intake and taking a fiber supplement (such as Metamucil, Citrucel, FiberCon, MiraLax, etc) 1-2 times a day regularly will usually help prevent this problem from occurring.  A mild laxative (prune juice, Milk of Magnesia, MiraLax, etc) should be taken according to package directions if there are no bowel movements after 48 hours.   5. Wash / shower every day.  You may shower over the dressings as they are waterproof.   6. Remove your waterproof bandages 5 days after surgery.  You may leave the incision open to air.  You may replace a dressing/Band-Aid to cover the incision for comfort if you wish.  Continue to shower over incision(s) after the dressing is off.    7. ACTIVITIES as tolerated:   a. You may resume regular (light) daily activities beginning the next day-such as daily self-care, walking, climbing stairs-gradually increasing activities as tolerated.  If you can walk 30 minutes without difficulty, it is safe to try more intense activity such as jogging, treadmill, bicycling, low-impact aerobics, swimming, etc. b. Save the most intensive and strenuous activity for last such as sit-ups, heavy lifting, contact sports, etc  Refrain from any heavy lifting or straining until you are off narcotics for pain control.     c. DO NOT PUSH THROUGH PAIN.  Let pain be your guide: If it hurts to do something, don't do it.  Pain is your body warning you to avoid that activity for another week until the pain goes down. d. You may drive when you are no longer taking prescription pain medication, you can comfortably wear a seatbelt, and you can safely maneuver your car and apply brakes. e. You may have sexual intercourse  when it is comfortable.  8. FOLLOW UP in our office a. Please call [facility] at [phone] to set up an appointment to see your surgeon in the office for a follow-up appointment approximately 2-3 weeks after your surgery. b. Make sure that you call for this appointment the day you arrive home to insure a convenient appointment time. 9.  IF YOU HAVE DISABILITY OR FAMILY LEAVE FORMS, BRING THEM TO THE OFFICE FOR PROCESSING.  DO NOT GIVE THEM TO YOUR DOCTOR.  WHEN TO CALL US [phone]: 1. Poor pain control 2. Reactions / problems with new medications (rash/itching, nausea, etc)  3. Fever over 101.5 F (38.5 C) 4. Inability to urinate 5. Nausea and/or vomiting 6. Worsening swelling or bruising 7. Continued bleeding from incision. 8. Increased pain, redness, or drainage from the incision   The clinic staff is available to answer your questions during regular business hours (8:30am-5pm).  Please don't hesitate to call and ask to speak to one of our nurses for clinical concerns.   If you have a medical emergency, go to the nearest emergency room or call 911.  A surgeon from [facility] is always on call at the hospitals in [place]  [facility] [address], [address], [place], [place]  [address] ?  [address] [address], [place], [place]   [address] MAIN: [phone] ? TOLL FREE: [phone] ? FAX: [phone] [email or web address]  

## 2013-09-05 ENCOUNTER — Ambulatory Visit (INDEPENDENT_AMBULATORY_CARE_PROVIDER_SITE_OTHER): Payer: BC Managed Care – PPO | Admitting: Surgery

## 2013-09-05 ENCOUNTER — Encounter (INDEPENDENT_AMBULATORY_CARE_PROVIDER_SITE_OTHER): Payer: Self-pay | Admitting: Surgery

## 2013-09-05 VITALS — BP 140/90 | HR 84 | Temp 98.0°F | Resp 14 | Ht 75.0 in | Wt 226.0 lb

## 2013-09-05 DIAGNOSIS — K389 Disease of appendix, unspecified: Secondary | ICD-10-CM

## 2013-09-05 DIAGNOSIS — Z9889 Other specified postprocedural states: Secondary | ICD-10-CM

## 2013-09-05 NOTE — Patient Instructions (Signed)
CT scan in 6 months to follow up appendix. Resume full activity 09/15/2013.

## 2013-09-05 NOTE — Progress Notes (Signed)
Patient returns after laparoscopic bilateral inguinal hernia repair with mesh and primary repair of umbilical hernia. He is much less  groin pain and left testicle pain. He has stopped taking his narcotic and is now on Tylenol do to him seeing things on oxycodone.  Exam: less bruising of the scrotal sac and less  tender left testicle. Right testicle is less  tender . Bruising or incisions. Mild swelling of both inguinal canals.  Impression: Status post laparoscopic bilateral inguinal hernia repair of mesh and primary repair of umbilical hernia and appendiclolith on CT asymptomatic  Plan: much better.  Needs CT in 6 months to follow up appendicolith.  Resume full activity in 2 weeks.  Follow up 6 months.

## 2014-01-02 ENCOUNTER — Encounter: Payer: Self-pay | Admitting: Cardiology

## 2014-01-18 ENCOUNTER — Ambulatory Visit: Payer: Self-pay | Admitting: Cardiology

## 2014-01-27 ENCOUNTER — Encounter: Payer: Self-pay | Admitting: *Deleted

## 2014-02-01 ENCOUNTER — Ambulatory Visit: Payer: Self-pay | Admitting: Cardiology

## 2014-02-01 ENCOUNTER — Encounter: Payer: Self-pay | Admitting: Cardiology

## 2014-02-01 ENCOUNTER — Ambulatory Visit (INDEPENDENT_AMBULATORY_CARE_PROVIDER_SITE_OTHER): Payer: BC Managed Care – PPO | Admitting: Cardiology

## 2014-02-01 VITALS — BP 147/90 | HR 67 | Ht 75.0 in | Wt 228.0 lb

## 2014-02-01 DIAGNOSIS — R0609 Other forms of dyspnea: Secondary | ICD-10-CM | POA: Insufficient documentation

## 2014-02-01 DIAGNOSIS — I209 Angina pectoris, unspecified: Secondary | ICD-10-CM | POA: Insufficient documentation

## 2014-02-01 DIAGNOSIS — R0600 Dyspnea, unspecified: Secondary | ICD-10-CM | POA: Insufficient documentation

## 2014-02-01 DIAGNOSIS — I509 Heart failure, unspecified: Secondary | ICD-10-CM

## 2014-02-01 DIAGNOSIS — E785 Hyperlipidemia, unspecified: Secondary | ICD-10-CM | POA: Insufficient documentation

## 2014-02-01 DIAGNOSIS — R0989 Other specified symptoms and signs involving the circulatory and respiratory systems: Secondary | ICD-10-CM

## 2014-02-01 DIAGNOSIS — I10 Essential (primary) hypertension: Secondary | ICD-10-CM | POA: Insufficient documentation

## 2014-02-01 DIAGNOSIS — R079 Chest pain, unspecified: Secondary | ICD-10-CM | POA: Insufficient documentation

## 2014-02-01 LAB — TSH: TSH: 1.9 u[IU]/mL (ref 0.35–4.50)

## 2014-02-01 LAB — LIPID PANEL
Cholesterol: 201 mg/dL — ABNORMAL HIGH (ref 0–200)
HDL: 28.9 mg/dL — ABNORMAL LOW (ref >39.00)
LDL Cholesterol: 150 mg/dL — ABNORMAL HIGH (ref 0–99)
Total CHOL/HDL Ratio: 7
Triglycerides: 113 mg/dL (ref 0.0–149.0)
VLDL: 22.6 mg/dL (ref 0.0–40.0)

## 2014-02-01 LAB — HEMOGLOBIN A1C: Hgb A1c MFr Bld: 5.9 % (ref 4.6–6.5)

## 2014-02-01 NOTE — Patient Instructions (Addendum)
Your physician recommends that you continue on your current medications as directed. Please refer to the Current Medication list given to you today.  Your physician recommends that you return for lab work in: TODAY (LIPID PROFILE, HBG A1C, TSH)  Your physician has requested that you have en exercise stress myoview. For further information please visit [email or web address]. Please follow instruction sheet, as given.  Your physician has requested that you have an echocardiogram. Echocardiography is a painless test that uses sound waves to create images of your heart. It provides your doctor with information about the size and shape of your heart and how well your heart's chambers and valves are working. This procedure takes approximately one hour. There are no restrictions for this procedure.  Your physician recommends that you schedule a follow-up appointment in: PENDING RESULTS

## 2014-02-01 NOTE — Progress Notes (Signed)
Patient ID: ELIU BATCH, male   DOB: 08-Nov-1950, 64 y.o.   MRN: [id number]    Patient Name: Kyle Davidson Date of Encounter: 02/01/2014  Primary Care Provider:  Florina Ou, MD Primary Cardiologist:  Dorothy Spark  Problem List   Past Medical History  Diagnosis Date  . GERD (gastroesophageal reflux disease)   . Vertebral fracture     x2 neck area  . Arthritis   . PONV (postoperative nausea and vomiting)     occ  . Depression     hx no rx now 10 yrs ago  . Pneumonia     hx  . Heart murmur     hx  . History of kidney stones   . Headache(784.0)     occ  . Cancer     skin   Past Surgical History  Procedure Laterality Date  . Shoulder arthroscopy Bilateral     rt 13 lft 15 yrs ago  . Back surgery      x2 15-20 yrs  . Nasal septum surgery    . Hand surgery Left     thumb x2  . Inguinal hernia repair Bilateral 08/16/2013    Procedure: LAPAROSCOPIC BILATERAL INGUINAL HERNIA WITH UMBILICAL HERNIA;  Surgeon: Joyice Faster. Cornett, MD;  Location: [facility];  Service: General;  Laterality: Bilateral;  . Insertion of mesh Bilateral 08/16/2013    Procedure: INSERTION OF MESH;  Surgeon: Joyice Faster. Cornett, MD;  Location: [facility];  Service: General;  Laterality: Bilateral;    Allergies  Allergies  Allergen Reactions  . Codeine Other (See Comments)    Bad headaches.....makes him see things    HPI  A very pleasant 63 year old gentleman with prior medical history of multiple surgeries involving his back surgery and bilateral sore shoulder surgery for which she is on full disability. The patient also has history of elevated triglycerides treated by omega-3 acids by his primary care physician. He is coming on because he is concerned on exertional shortness of breath associated with chest tightness and chest pain usually resolves at rest the patient states that overall he has been feeling very tired over the last year and gets short of breath with minimal activity. He is chest pain  usually only last a few minutes and boost radiation to his left arm and his jaw. He denies any palpitations or prior syncope. He is not very physically active. He has family history of coronary artery disease. His father had myocardial infarction at age of 16 after which he passed away. The patient himself never smoked but he was exposed to secondhand smoking in his childhood from both of his parents. He is also complaining of difficulty swallowing and choking on your pieces of food that he has experienced in the last year. He also states that he constantly feels a fullness in his throat, but his weight hasn't changed in the last year. The patient also noticed intermittent lower extremity edema in the last year.  Home Medications  Prior to Admission medications   Medication Sig Start Date End Date Taking? Authorizing Provider  acetaminophen (TYLENOL) 500 MG tablet Take 1,000 mg by mouth daily as needed. pain   Yes Historical Provider, MD  aspirin EC 81 MG tablet Take 81 mg by mouth daily.   Yes Historical Provider, MD  cetirizine (ZYRTEC) 10 MG tablet Take 10 mg by mouth daily.   Yes Historical Provider, MD  naproxen sodium (ALEVE) 220 MG tablet Take 440 mg by mouth 3 (three)  times a week.   Yes Historical Provider, MD  Omega-3 Fatty Acids (FISH OIL) 1000 MG CAPS Take 1 capsule by mouth daily.    Yes Historical Provider, MD  pantoprazole (PROTONIX) 40 MG tablet Take 40 mg by mouth 2 (two) times daily.   Yes Historical Provider, MD   Family History  Family History  Problem Relation Age of Onset  . Bladder Cancer Father   . Prostate cancer Father     Social History  History   Social History  . Marital Status: Married    Spouse Name: N/A    Number of Children: N/A  . Years of Education: N/A   Occupational History  . Not on file.   Social History Main Topics  . Smoking status: Never Smoker   . Smokeless tobacco: Never Used  . Alcohol Use: No  . Drug Use: No  . Sexual Activity: Not  on file   Other Topics Concern  . Not on file   Social History Narrative  . No narrative on file     Review of Systems, as per HPI, otherwise negative General:  No chills, fever, night sweats or weight changes.  Cardiovascular:  No chest pain, dyspnea on exertion, edema, orthopnea, palpitations, paroxysmal nocturnal dyspnea. Dermatological: No rash, lesions/masses Respiratory: No cough, dyspnea Urologic: No hematuria, dysuria Abdominal:   No nausea, vomiting, diarrhea, bright red blood per rectum, melena, or hematemesis Neurologic:  No visual changes, wkns, changes in mental status. All other systems reviewed and are otherwise negative except as noted above.  Physical Exam  Blood pressure 147/90, pulse 67, height 6\' 3"  (1.905 m), weight 228 lb (103.42 kg).  General: Pleasant, NAD Psych: Normal affect. Neuro: Alert and oriented X 3. Moves all extremities spontaneously. HEENT: Normal  Neck: Supple without bruits or JVD. Lungs:  Resp regular and unlabored, CTA. Heart: RRR no s3, s4, or murmurs. Abdomen: Soft, non-tender, non-distended, BS + x 4.  Extremities: No clubbing, cyanosis or edema. DP/PT/Radials 2+ and equal bilaterally.  Labs:  No results found for this basename: CKTOTAL, CKMB, TROPONINI,  in the last 72 hours Lab Results  Component Value Date   WBC 6.2 08/08/2013   HGB 17.7* 08/08/2013   HCT 48.5 08/08/2013   MCV 90.7 08/08/2013   PLT 142* 08/08/2013       Component Value Date/Time   NA 142 08/08/2013 0849   K 4.2 08/08/2013 0849   CL 107 08/08/2013 0849   CO2 24 08/08/2013 0849   GLUCOSE 104* 08/08/2013 0849   BUN 14 08/08/2013 0849   CREATININE 0.92 08/08/2013 0849   CALCIUM 9.2 08/08/2013 0849   PROT 7.7 08/08/2013 0849   ALBUMIN 4.0 08/08/2013 0849   AST 29 08/08/2013 0849   ALT 28 08/08/2013 0849   ALKPHOS 64 08/08/2013 0849   BILITOT 1.0 08/08/2013 0849   GFRNONAA 89* 08/08/2013 0849   GFRAA >90 08/08/2013 0849   No results found for this  basename: CHOL, HDL, LDLCALC, TRIG    Accessory Clinical Findings  Echocardiogram - none  ECG - sinus rhythm, 67 beats per minute first degree AV block, PR interval 220 ms otherwise normal EKG.    Assessment & Plan  A pleasant 63 year old gentleman with prior medical history of hyperlipidemia and secondhand smoking  1. chest pain with exertional shortness of breath - appears typical, normal baseline EKG with first degree AV block, we will order an exercise nuclear stress test  Evaluate for ischemia.  2. dyspnea on exertion with lower  extremity edema - possible congestive heart failure we will order an echocardiogram to evaluate for systolic and diastolic function.  3. Hypertension - systolic and diastolic pressure elevated today however this is the first office visit, patient states always controlled at home. We will recheck at the stress test and also response to the exertion.  4. Lipids - we will check today a long with comprehensive metabolic profile, TSH and hemoglobin A1c.  Followup in 4 weeks.   Dorothy Spark, MD, [facility] 02/01/2014, 9:13 AM

## 2014-02-20 ENCOUNTER — Ambulatory Visit (HOSPITAL_BASED_OUTPATIENT_CLINIC_OR_DEPARTMENT_OTHER): Payer: BC Managed Care – PPO | Admitting: Radiology

## 2014-02-20 ENCOUNTER — Other Ambulatory Visit: Payer: Self-pay

## 2014-02-20 ENCOUNTER — Ambulatory Visit (HOSPITAL_COMMUNITY): Payer: BC Managed Care – PPO | Attending: Cardiovascular Disease | Admitting: Radiology

## 2014-02-20 VITALS — BP 124/80 | Ht 75.0 in | Wt 224.0 lb

## 2014-02-20 DIAGNOSIS — R079 Chest pain, unspecified: Secondary | ICD-10-CM | POA: Insufficient documentation

## 2014-02-20 DIAGNOSIS — R0609 Other forms of dyspnea: Secondary | ICD-10-CM | POA: Insufficient documentation

## 2014-02-20 DIAGNOSIS — R0602 Shortness of breath: Secondary | ICD-10-CM

## 2014-02-20 DIAGNOSIS — I509 Heart failure, unspecified: Secondary | ICD-10-CM | POA: Insufficient documentation

## 2014-02-20 DIAGNOSIS — R072 Precordial pain: Secondary | ICD-10-CM

## 2014-02-20 DIAGNOSIS — R0989 Other specified symptoms and signs involving the circulatory and respiratory systems: Secondary | ICD-10-CM

## 2014-02-20 DIAGNOSIS — R609 Edema, unspecified: Secondary | ICD-10-CM

## 2014-02-20 HISTORY — PX: CARDIOVASCULAR STRESS TEST: SHX262

## 2014-02-20 HISTORY — PX: TRANSTHORACIC ECHOCARDIOGRAM: SHX275

## 2014-02-20 MED ORDER — REGADENOSON 0.4 MG/5ML IV SOLN
0.4000 mg | Freq: Once | INTRAVENOUS | Status: AC
  Administered 2014-02-20: 0.4 mg via INTRAVENOUS

## 2014-02-20 MED ORDER — TECHNETIUM TC 99M SESTAMIBI GENERIC - CARDIOLITE
33.0000 | Freq: Once | INTRAVENOUS | Status: AC | PRN
  Administered 2014-02-20: 33 via INTRAVENOUS

## 2014-02-20 MED ORDER — TECHNETIUM TC 99M SESTAMIBI GENERIC - CARDIOLITE
11.0000 | Freq: Once | INTRAVENOUS | Status: AC | PRN
  Administered 2014-02-20: 11 via INTRAVENOUS

## 2014-02-20 MED ORDER — AMINOPHYLLINE 25 MG/ML IV SOLN
75.0000 mg | Freq: Two times a day (BID) | INTRAVENOUS | Status: DC | PRN
  Administered 2014-02-20: 75 mg via INTRAVENOUS

## 2014-02-20 NOTE — Progress Notes (Signed)
Echocardiogram performed.  

## 2014-02-20 NOTE — Progress Notes (Signed)
[facility] [address] [place], [place] [address] [phone]    Cardiology Nuclear Med Study  Kyle Davidson is a 63 y.o. male     MRN : [id number]     DOB: 23-May-1951  Procedure Date: 02/20/2014  Nuclear Med Background Indication for Stress Test:  Evaluation for Ischemia History:  No H/O CAD 02/20/14 ECHO: Pending Cardiac Risk Factors: Family History - CAD, Hypertension and Lipids  Symptoms:  Chest Pain and DOE   Nuclear Pre-Procedure Caffeine/Decaff Intake:  None> 12 hrs NPO After: 8:30pm   Lungs: clear clear O2 Sat: 97% on room air. IV 0.9% NS with Angio Cath:  22g  IV Site: R Hand x 1, tolerated well IV Started by:  Irven Baltimore, RN  Chest Size (in):  46 Cup Size: n/a  Height: 6\' 3"  (1.905 m)  Weight:  224 lb (101.606 kg)  BMI:  Body mass index is 28 kg/(m^2). Tech Comments:  This patient given Aminophylline for symptoms. All were resolved after 2 doses of 75 mg IV. Bebe Liter EMTP    Nuclear Med Study 1 or 2 day study: 1 day  Stress Test Type:  Treadmill/Lexiscan  Reading MD: N/A  Order Authorizing Provider:  Ena Dawley, MD  Resting Radionuclide: Technetium 40m Sestamibi  Resting Radionuclide Dose: 11.0 mCi   Stress Radionuclide:  Technetium 61m Sestamibi  Stress Radionuclide Dose: 33.0 mCi           Stress Protocol Rest HR: 57 Stress HR: 115  Rest BP: 124/80 Stress BP: 164/69  Exercise Time (min): 4:19 METS: 4.60   Predicted Max HR: 157 bpm % Max HR: 73.25 bpm Rate Pressure Product: 18860   Dose of Adenosine (mg):  n/a Dose of Lexiscan: 0.4 mg  Dose of Atropine (mg): n/a Dose of Dobutamine: n/a mcg/kg/min (at max HR)  Stress Test Technologist: Perrin Maltese, EMT-P  Nuclear Technologist:  Charlton Amor, CNMT     Rest Procedure:  Myocardial perfusion imaging was performed at rest 45 minutes following the intravenous administration of Technetium 53m Sestamibi. Rest ECG: NSR - Normal EKG  Stress Procedure:  The  patient received IV Lexiscan 0.4 mg over 15-seconds with concurrent low level exercise and then Technetium 68m Sestamibi was injected at 30-seconds while the patient continued walking one more minute. This patient had fatigue, sob, chest pressure, headache, and was lt. Headed with the Lexiscan injection. Quantitative spect images were obtained after a 45-minute delay. Stress ECG: No significant change from baseline ECG  QPS Raw Data Images:  Normal; no motion artifact; normal heart/lung ratio. Stress Images:  There is mild apical thinning with normal uptake in other regions. Rest Images:  There is mild apical thinning with normal uptake in other regions. Subtraction (SDS):  No evidence of ischemia. Transient Ischemic Dilatation (Normal <1.22):  1.03 Lung/Heart Ratio (Normal <0.45):  0.40  Quantitative Gated Spect Images QGS EDV:  112 ml QGS ESV:  37 ml  Impression Exercise Capacity:  Poor exercise capacity. BP Response:  Normal blood pressure response. Clinical Symptoms:  Mild chest pain/dyspnea. ECG Impression:  No significant ST segment change suggestive of ischemia. Comparison with Prior Nuclear Study: No images to compare  Overall Impression:  Low risk stress nuclear study.  Poor exercise capacity so he was switched to [employer]. There is apical thinning seen on rest and stress images. No reversible ischemia.  LV Ejection Fraction: 67%.  LV Wall Motion:  Normal Wall Motion  Darlin Coco MD

## 2014-02-24 ENCOUNTER — Telehealth: Payer: Self-pay | Admitting: *Deleted

## 2014-02-24 ENCOUNTER — Telehealth: Payer: Self-pay | Admitting: Cardiology

## 2014-02-24 DIAGNOSIS — R030 Elevated blood-pressure reading, without diagnosis of hypertension: Secondary | ICD-10-CM

## 2014-02-24 DIAGNOSIS — E785 Hyperlipidemia, unspecified: Secondary | ICD-10-CM

## 2014-02-24 DIAGNOSIS — R079 Chest pain, unspecified: Secondary | ICD-10-CM

## 2014-02-24 DIAGNOSIS — IMO0001 Reserved for inherently not codable concepts without codable children: Secondary | ICD-10-CM

## 2014-02-24 DIAGNOSIS — R0609 Other forms of dyspnea: Secondary | ICD-10-CM

## 2014-02-24 DIAGNOSIS — I10 Essential (primary) hypertension: Secondary | ICD-10-CM

## 2014-02-24 MED ORDER — AMLODIPINE BESYLATE 2.5 MG PO TABS: 2.5000 mg | ORAL_TABLET | Freq: Every day | ORAL | Status: DC

## 2014-02-24 MED ORDER — ATORVASTATIN CALCIUM 10 MG PO TABS: 10.0000 mg | ORAL_TABLET | Freq: Every day | ORAL | Status: DC

## 2014-02-24 NOTE — Telephone Encounter (Signed)
New message          Pt is calling about results of nuclear stress test

## 2014-02-24 NOTE — Telephone Encounter (Signed)
He is to be on this po QD.

## 2014-02-24 NOTE — Telephone Encounter (Signed)
[facility] in [place] called to clarify if the patient is to be on amlodipine qd or bid since it was sent in for #180. Please advise. Thanks, MI

## 2014-02-24 NOTE — Telephone Encounter (Signed)
Contacted pt about nuclear stress test results.  Per Dr Meda Coffee this pts stress test was normal, but pt is deconditioned and needs to walk daily. \ Pt also needs to start on Amlodipine 2.5 mg po daily and Atorvastatin 10 mg po daily for elevated cholesterol and HTN.  Pt should also have LFTs checked at next OV on 7/30.  Sent new med orders to pharmacy of choice.  Pt verbalized understanding and agrees with this plan.

## 2014-02-27 NOTE — Telephone Encounter (Signed)
Pharmacy aware, and will fill for #90.

## 2014-04-03 ENCOUNTER — Encounter (INDEPENDENT_AMBULATORY_CARE_PROVIDER_SITE_OTHER): Payer: Self-pay | Admitting: Surgery

## 2014-04-03 ENCOUNTER — Ambulatory Visit (INDEPENDENT_AMBULATORY_CARE_PROVIDER_SITE_OTHER): Payer: BC Managed Care – PPO | Admitting: Surgery

## 2014-04-03 VITALS — BP 134/82 | HR 64 | Resp 12 | Ht 75.0 in | Wt 228.2 lb

## 2014-04-03 DIAGNOSIS — K389 Disease of appendix, unspecified: Secondary | ICD-10-CM | POA: Insufficient documentation

## 2014-04-03 NOTE — Progress Notes (Signed)
Subjective:     Patient ID: Kyle Davidson, male   DOB: Jul 25, 1951, 63 y.o.   MRN: [id number]  HPI  Patient returns 6 months after laparoscopic bilateral inguinal hernia repair. On preop imaging a appendicolith was noted which is asymptomatic. He is here today for followup of his appendicolith. He has some mild discomfort around his umbilicus from the sutures. He does have bilateral testicular pain left greater than right. He is followed by urology for that. Denies any severe or significant abdominal pain. Has some occasional right lower quadrant discomfort but this does not appear to be a common problem. He is up-to-date on his colonoscopy. Review of Systems  HENT: Negative.   Eyes: Negative.   Gastrointestinal: Positive for abdominal pain.  Genitourinary: Positive for testicular pain.       Objective:   Physical Exam  Constitutional: He appears well-developed and well-nourished.  HENT:  Head: Normocephalic.  Neck: Normal range of motion.  Abdominal: He exhibits no distension. There is no tenderness. Hernia confirmed negative in the right inguinal area and confirmed negative in the left inguinal area.  Genitourinary: Right testis shows tenderness. Left testis shows tenderness.  Skin: Skin is warm and dry.       Assessment:     appendicolith asymptomatic  History of bilateral inguinal hernia laparoscopic 6 months ago  Bilateral testicular pain chronic    Plan:     He is following up with urology for his testicular issues. Repeat CT scan to evaluate appendicolith to ensure stability. If there is any signs of dilation the appendix or other finding a wart surgery, our discussed this with him and proceed. If stable, can followup as needed. Of note, patient complains of sutured at umbilicus. These could be removed down the road if they continue to cause discomfort. I told him to call me in the year if this continues to be a problem

## 2014-04-03 NOTE — Patient Instructions (Signed)
Will check CT to follow up appendicolith. If suture material continues to bother at umbilicus,  Please call.

## 2014-04-07 ENCOUNTER — Ambulatory Visit
Admission: RE | Admit: 2014-04-07 | Discharge: 2014-04-07 | Disposition: A | Discharge status: Home / Self Care | Payer: BC Managed Care – PPO | Source: Ambulatory Visit | Attending: Surgery | Admitting: Surgery

## 2014-04-07 DIAGNOSIS — K389 Disease of appendix, unspecified: Secondary | ICD-10-CM

## 2014-04-07 MED ORDER — IOHEXOL 300 MG/ML  SOLN
100.0000 mL | Freq: Once | INTRAMUSCULAR | Status: AC | PRN
  Administered 2014-04-07: 100 mL via INTRAVENOUS

## 2014-04-12 ENCOUNTER — Telehealth (INDEPENDENT_AMBULATORY_CARE_PROVIDER_SITE_OTHER): Payer: Self-pay

## 2014-04-12 NOTE — Telephone Encounter (Signed)
No appendicolith   Appendix now normal.  Nothing to explain pain but pt having testicular pain and pain at umbilicus. He didn't complain of much pain to me at his visit.  Nothing surgically to do.  Continue follow up with urology.  Can come back to office but I have nothing more to offer except remove sutures in umbilicus as outpatient.

## 2014-04-12 NOTE — Telephone Encounter (Signed)
Pt was given his CT results which showed unremarkable abdomen and pelvic areas.  He is still having pain.  Dr. Brantley Stage made aware.

## 2014-04-20 ENCOUNTER — Other Ambulatory Visit: Payer: Self-pay

## 2014-04-20 ENCOUNTER — Ambulatory Visit: Payer: Self-pay | Admitting: Cardiology

## 2014-04-24 ENCOUNTER — Other Ambulatory Visit: Payer: Self-pay | Admitting: *Deleted

## 2014-05-02 ENCOUNTER — Other Ambulatory Visit: Payer: BC Managed Care – PPO

## 2014-05-02 ENCOUNTER — Ambulatory Visit (INDEPENDENT_AMBULATORY_CARE_PROVIDER_SITE_OTHER): Payer: BC Managed Care – PPO | Admitting: Cardiology

## 2014-05-02 ENCOUNTER — Encounter: Payer: Self-pay | Admitting: Cardiology

## 2014-05-02 VITALS — BP 140/88 | HR 72 | Ht 75.0 in | Wt 227.8 lb

## 2014-05-02 DIAGNOSIS — R079 Chest pain, unspecified: Secondary | ICD-10-CM

## 2014-05-02 DIAGNOSIS — R0989 Other specified symptoms and signs involving the circulatory and respiratory systems: Secondary | ICD-10-CM

## 2014-05-02 DIAGNOSIS — I10 Essential (primary) hypertension: Secondary | ICD-10-CM

## 2014-05-02 DIAGNOSIS — R0609 Other forms of dyspnea: Secondary | ICD-10-CM

## 2014-05-02 DIAGNOSIS — IMO0001 Reserved for inherently not codable concepts without codable children: Secondary | ICD-10-CM

## 2014-05-02 DIAGNOSIS — R030 Elevated blood-pressure reading, without diagnosis of hypertension: Secondary | ICD-10-CM

## 2014-05-02 DIAGNOSIS — E785 Hyperlipidemia, unspecified: Secondary | ICD-10-CM

## 2014-05-02 NOTE — Patient Instructions (Signed)
Your physician recommends that you continue on your current medications as directed. Please refer to the Current Medication list given to you today.  Your physician recommends that you return for lab work in: [place] [facility] wants you to follow-up in: [place] will receive a reminder letter in the mail two months in advance. If you don't receive a letter, please call our office to schedule the follow-up appointment.

## 2014-05-02 NOTE — Progress Notes (Signed)
Patient ID: DONTARIO EVETTS, male   DOB: 04-10-1951, 63 y.o.   MRN: [id number]    Patient Name: Kyle Davidson Date of Encounter: 05/02/2014  Primary Care Provider:  Florina Ou, MD Primary Cardiologist:  Dorothy Spark  Problem List   Past Medical History  Diagnosis Date  . GERD (gastroesophageal reflux disease)   . Vertebral fracture     x2 neck area  . Arthritis   . PONV (postoperative nausea and vomiting)     occ  . Depression     hx no rx now 10 yrs ago  . Pneumonia     hx  . Heart murmur     hx  . History of kidney stones   . Headache(784.0)     occ  . Cancer     skin   Past Surgical History  Procedure Laterality Date  . Shoulder arthroscopy Bilateral     rt 13 lft 15 yrs ago  . Back surgery      x2 15-20 yrs  . Nasal septum surgery    . Hand surgery Left     thumb x2  . Inguinal hernia repair Bilateral 08/16/2013    Procedure: LAPAROSCOPIC BILATERAL INGUINAL HERNIA WITH UMBILICAL HERNIA;  Surgeon: Joyice Faster. Cornett, MD;  Location: [facility];  Service: General;  Laterality: Bilateral;  . Insertion of mesh Bilateral 08/16/2013    Procedure: INSERTION OF MESH;  Surgeon: Joyice Faster. Cornett, MD;  Location: [facility];  Service: General;  Laterality: Bilateral;    Allergies  Allergies  Allergen Reactions  . Codeine Other (See Comments)    Bad headaches.....makes him see things    HPI  A very pleasant 63 year old gentleman with prior medical history of multiple surgeries involving his back surgery and bilateral sore shoulder surgery for which she is on full disability. The patient also has history of elevated triglycerides treated by omega-3 acids by his primary care physician. He is coming on because he is concerned on exertional shortness of breath associated with chest tightness and chest pain usually resolves at rest the patient states that overall he has been feeling very tired over the last year and gets short of breath with minimal activity. He is chest pain  usually only last a few minutes and boost radiation to his left arm and his jaw. He denies any palpitations or prior syncope. He is not very physically active. He has family history of coronary artery disease. His father had myocardial infarction at age of 86 after which he passed away. The patient himself never smoked but he was exposed to secondhand smoking in his childhood from both of his parents. He is also complaining of difficulty swallowing and choking on your pieces of food that he has experienced in the last year. He also states that he constantly feels a fullness in his throat, but his weight hasn't changed in the last year. The patient also noticed intermittent lower extremity edema in the last year.  05/02/2014 - patient is coming after his tests. His echocardiogram showed normal left ventricular function and only mild left atrial dilatation. He also underwent exercise nuclear stress test that showed no prior scar, no ischemia and poor exercise tolerance. His blood pressure response to stress was normal. Since the last visit the patient started to exercise on almost a daily basis, he feels significantly better. He is not taking Lipitor and we'll try on just diet for now. He increased his fish oil to 2 g daily. He is  also not taking amlodipine as he couldn't afford the co-pay.  Home Medications  Prior to Admission medications   Medication Sig Start Date End Date Taking? Authorizing Provider  acetaminophen (TYLENOL) 500 MG tablet Take 1,000 mg by mouth daily as needed. pain   Yes Historical Provider, MD  aspirin EC 81 MG tablet Take 81 mg by mouth daily.   Yes Historical Provider, MD  cetirizine (ZYRTEC) 10 MG tablet Take 10 mg by mouth daily.   Yes Historical Provider, MD  naproxen sodium (ALEVE) 220 MG tablet Take 440 mg by mouth 3 (three) times a week.   Yes Historical Provider, MD  Omega-3 Fatty Acids (FISH OIL) 1000 MG CAPS Take 1 capsule by mouth daily.    Yes Historical Provider, MD    pantoprazole (PROTONIX) 40 MG tablet Take 40 mg by mouth 2 (two) times daily.   Yes Historical Provider, MD   Family History  Family History  Problem Relation Age of Onset  . Bladder Cancer Father   . Prostate cancer Father     Social History  History   Social History  . Marital Status: Married    Spouse Name: N/A    Number of Children: N/A  . Years of Education: N/A   Occupational History  . Not on file.   Social History Main Topics  . Smoking status: Never Smoker   . Smokeless tobacco: Never Used  . Alcohol Use: No  . Drug Use: No  . Sexual Activity: Not on file   Other Topics Concern  . Not on file   Social History Narrative  . No narrative on file     Review of Systems, as per HPI, otherwise negative General:  No chills, fever, night sweats or weight changes.  Cardiovascular:  No chest pain, dyspnea on exertion, edema, orthopnea, palpitations, paroxysmal nocturnal dyspnea. Dermatological: No rash, lesions/masses Respiratory: No cough, dyspnea Urologic: No hematuria, dysuria Abdominal:   No nausea, vomiting, diarrhea, bright red blood per rectum, melena, or hematemesis Neurologic:  No visual changes, wkns, changes in mental status. All other systems reviewed and are otherwise negative except as noted above.  Physical Exam  Blood pressure 140/88, pulse 72, height 6\' 3"  (1.905 m), weight 227 lb 12.8 oz (103.329 kg).  General: Pleasant, NAD Psych: Normal affect. Neuro: Alert and oriented X 3. Moves all extremities spontaneously. HEENT: Normal  Neck: Supple without bruits or JVD. Lungs:  Resp regular and unlabored, CTA. Heart: RRR no s3, s4, or murmurs. Abdomen: Soft, non-tender, non-distended, BS + x 4.  Extremities: No clubbing, cyanosis or edema. DP/PT/Radials 2+ and equal bilaterally.  Labs:  No results found for this basename: CKTOTAL, CKMB, TROPONINI,  in the last 72 hours Lab Results  Component Value Date   WBC 6.2 08/08/2013   HGB 17.7*  08/08/2013   HCT 48.5 08/08/2013   MCV 90.7 08/08/2013   PLT 142* 08/08/2013       Component Value Date/Time   NA 142 08/08/2013 0849   K 4.2 08/08/2013 0849   CL 107 08/08/2013 0849   CO2 24 08/08/2013 0849   GLUCOSE 104* 08/08/2013 0849   BUN 14 08/08/2013 0849   CREATININE 0.92 08/08/2013 0849   CALCIUM 9.2 08/08/2013 0849   PROT 7.7 08/08/2013 0849   ALBUMIN 4.0 08/08/2013 0849   AST 29 08/08/2013 0849   ALT 28 08/08/2013 0849   ALKPHOS 64 08/08/2013 0849   BILITOT 1.0 08/08/2013 0849   GFRNONAA 89* 08/08/2013 0849   GFRAA >90  08/08/2013 0849   Lab Results  Component Value Date   CHOL 201* 02/01/2014    Accessory Clinical Findings  Echocardiogram - 02/20/2014 Left ventricle: The cavity size was normal. Systolic function was normal. The estimated ejection fraction was in the range of 50% to 55%. Wall motion was normal; there were no regional wall motion abnormalities. - Left atrium: The atrium was mildly dilated.  ECG - sinus rhythm, 67 beats per minute first degree AV block, PR interval 220 ms otherwise normal EKG.  Exercise nuclear stress test: 02/20/2014 Impression  Exercise Capacity: Poor exercise capacity.  BP Response: Normal blood pressure response.  Clinical Symptoms: Mild chest pain/dyspnea.  ECG Impression: No significant ST segment change suggestive of ischemia.  Comparison with Prior Nuclear Study: No images to compare  Overall Impression: Low risk stress nuclear study. Poor exercise capacity so he was switched to [employer]. There is apical thinning seen on rest and stress images. No reversible ischemia.  LV Ejection Fraction: 67%. LV Wall Motion: Normal Wall Motion  Darlin Coco MD   Assessment & Plan  A pleasant 63 year old gentleman with prior medical history of hyperlipidemia and secondhand smoking  1. Chest pain with exertional shortness of breath - appears typical, normal baseline EKG with first degree AV block, we will order an exercise  nuclear stress test  normal echocardiogram and normal stress test. Poor exercise capacity, patient started to exercise on daily basis and feels significantly better, he's encouraged to continue exercising and ideally lose 20 pounds in the next year.  2. Dyspnea on exertion with lower extremity edema - edema has resolved, echocardiogram showed normal ejection fraction.  3. Hypertension - systolic borderline today, patient wants to try just conservative management. With changes in diet and exercise we should be able to achieve the goal.  4. Lipids -  LDL 150, the patient has no known CAD, goal is under 100. He started to exercise and change his diet we will recheck in 6 months..  Followup in 6 months with lipid profile and CMP prior to the next visit.   Dorothy Spark, MD, [facility] 05/02/2014, 9:33 AM

## 2014-07-25 ENCOUNTER — Other Ambulatory Visit: Payer: Self-pay | Admitting: Gastroenterology

## 2014-07-25 DIAGNOSIS — R1310 Dysphagia, unspecified: Secondary | ICD-10-CM

## 2014-07-27 ENCOUNTER — Ambulatory Visit
Admission: RE | Admit: 2014-07-27 | Discharge: 2014-07-27 | Disposition: A | Discharge status: Home / Self Care | Payer: BC Managed Care – PPO | Source: Ambulatory Visit | Attending: Gastroenterology | Admitting: Gastroenterology

## 2014-07-27 DIAGNOSIS — R1310 Dysphagia, unspecified: Secondary | ICD-10-CM

## 2014-10-31 ENCOUNTER — Other Ambulatory Visit: Payer: Self-pay

## 2015-04-10 ENCOUNTER — Telehealth: Payer: Self-pay | Admitting: Family Medicine

## 2015-04-10 NOTE — Telephone Encounter (Signed)
Patient aware that we have no new patient appointments today and I would recommend urgent care.

## 2015-11-02 ENCOUNTER — Encounter (HOSPITAL_COMMUNITY): Payer: Self-pay | Admitting: *Deleted

## 2015-11-14 ENCOUNTER — Other Ambulatory Visit: Payer: Self-pay | Admitting: Gastroenterology

## 2015-11-19 ENCOUNTER — Ambulatory Visit (HOSPITAL_COMMUNITY): Payer: Medicare HMO

## 2015-11-19 ENCOUNTER — Ambulatory Visit (HOSPITAL_COMMUNITY)
Admission: RE | Admit: 2015-11-19 | Discharge: 2015-11-19 | Disposition: A | Discharge status: Home / Self Care | Payer: Medicare HMO | Source: Ambulatory Visit | Attending: Gastroenterology | Admitting: Gastroenterology

## 2015-11-19 ENCOUNTER — Encounter (HOSPITAL_COMMUNITY)
Admission: RE | Disposition: A | Discharge status: Home / Self Care | Payer: Self-pay | Source: Ambulatory Visit | Attending: Gastroenterology

## 2015-11-19 ENCOUNTER — Ambulatory Visit (HOSPITAL_COMMUNITY): Payer: Medicare HMO | Admitting: Anesthesiology

## 2015-11-19 ENCOUNTER — Encounter (HOSPITAL_COMMUNITY): Payer: Self-pay

## 2015-11-19 DIAGNOSIS — R079 Chest pain, unspecified: Secondary | ICD-10-CM | POA: Diagnosis not present

## 2015-11-19 DIAGNOSIS — E785 Hyperlipidemia, unspecified: Secondary | ICD-10-CM | POA: Insufficient documentation

## 2015-11-19 DIAGNOSIS — Z79899 Other long term (current) drug therapy: Secondary | ICD-10-CM | POA: Insufficient documentation

## 2015-11-19 DIAGNOSIS — I10 Essential (primary) hypertension: Secondary | ICD-10-CM | POA: Diagnosis not present

## 2015-11-19 DIAGNOSIS — K219 Gastro-esophageal reflux disease without esophagitis: Secondary | ICD-10-CM | POA: Diagnosis not present

## 2015-11-19 DIAGNOSIS — M1990 Unspecified osteoarthritis, unspecified site: Secondary | ICD-10-CM | POA: Insufficient documentation

## 2015-11-19 DIAGNOSIS — Z7982 Long term (current) use of aspirin: Secondary | ICD-10-CM | POA: Insufficient documentation

## 2015-11-19 DIAGNOSIS — K2289 Other specified disease of esophagus: Secondary | ICD-10-CM

## 2015-11-19 DIAGNOSIS — K222 Esophageal obstruction: Secondary | ICD-10-CM | POA: Diagnosis not present

## 2015-11-19 DIAGNOSIS — K228 Other specified diseases of esophagus: Secondary | ICD-10-CM

## 2015-11-19 DIAGNOSIS — R1313 Dysphagia, pharyngeal phase: Secondary | ICD-10-CM | POA: Insufficient documentation

## 2015-11-19 HISTORY — PX: SAVORY DILATION: SHX5439

## 2015-11-19 HISTORY — PX: ESOPHAGOGASTRODUODENOSCOPY (EGD) WITH PROPOFOL: SHX5813

## 2015-11-19 SURGERY — ESOPHAGOGASTRODUODENOSCOPY (EGD) WITH PROPOFOL
Anesthesia: Monitor Anesthesia Care

## 2015-11-19 MED ORDER — PROPOFOL 10 MG/ML IV BOLUS
INTRAVENOUS | Status: DC | PRN
  Administered 2015-11-19: 20 mg via INTRAVENOUS
  Administered 2015-11-19: 30 mg via INTRAVENOUS
  Administered 2015-11-19: 20 mg via INTRAVENOUS
  Administered 2015-11-19: 50 mg via INTRAVENOUS
  Administered 2015-11-19: 10 mg via INTRAVENOUS

## 2015-11-19 MED ORDER — PROPOFOL 10 MG/ML IV BOLUS
INTRAVENOUS | Status: AC
  Filled 2015-11-19: qty 40

## 2015-11-19 MED ORDER — LACTATED RINGERS IV SOLN
INTRAVENOUS | Status: DC
Start: 2015-11-19 — End: 2015-11-19
  Administered 2015-11-19: 1000 mL via INTRAVENOUS

## 2015-11-19 MED ORDER — PROPOFOL 500 MG/50ML IV EMUL
INTRAVENOUS | Status: DC | PRN
  Administered 2015-11-19: 100 ug/kg/min via INTRAVENOUS

## 2015-11-19 MED ORDER — SODIUM CHLORIDE 0.9 % IV SOLN: INTRAVENOUS | Status: DC

## 2015-11-19 SURGICAL SUPPLY — 14 items

## 2015-11-19 NOTE — Discharge Instructions (Signed)
Monitored Anesthesia Care Monitored anesthesia care is an anesthesia service for a medical procedure. Anesthesia is the loss of the ability to feel pain. It is produced by medicines called anesthetics. It may affect a small area of your body (local anesthesia), a large area of your body (regional anesthesia), or your entire body (general anesthesia). The need for monitored anesthesia care depends your procedure, your condition, and the potential need for regional or general anesthesia. It is often provided during procedures where:   General anesthesia may be needed if there are complications. This is because you need special care when you are under general anesthesia.   You will be under local or regional anesthesia. This is so that you are able to have higher levels of anesthesia if needed.   You will receive calming medicines (sedatives). This is especially the case if sedatives are given to put you in a semi-conscious state of relaxation (deep sedation). This is because the amount of sedative needed to produce this state can be hard to predict. Too much of a sedative can produce general anesthesia. Monitored anesthesia care is performed by one or more health care providers who have special training in all types of anesthesia. You will need to meet with these health care providers before your procedure. During this meeting, they will ask you about your medical history. They will also give you instructions to follow. (For example, you will need to stop eating and drinking before your procedure. You may also need to stop or change medicines you are taking.) During your procedure, your health care providers will stay with you. They will:   Watch your condition. This includes watching your blood pressure, breathing, and level of pain.   Diagnose and treat problems that occur.   Give medicines if they are needed. These may include calming medicines (sedatives) and anesthetics.   Make sure you are  comfortable.  Having monitored anesthesia care does not necessarily mean that you will be under anesthesia. It does mean that your health care providers will be able to manage anesthesia if you need it or if it occurs. It also means that you will be able to have a different type of anesthesia than you are having if you need it. When your procedure is complete, your health care providers will continue to watch your condition. They will make sure any medicines wear off before you are allowed to go home.    This information is not intended to replace advice given to you by your health care provider. Make sure you discuss any questions you have with your health care provider.   Document Released: 06/04/2005 Document Revised: 09/29/2014 Document Reviewed: 10/20/2012 Elsevier Interactive Patient Education 2016 [place]. Esophagogastroduodenoscopy, Care After Refer to this sheet in the next few weeks. These instructions provide you with information about caring for yourself after your procedure. Your health care provider may also give you more specific instructions. Your treatment has been planned according to current medical practices, but problems sometimes occur. Call your health care provider if you have any problems or questions after your procedure. WHAT TO EXPECT AFTER THE PROCEDURE After your procedure, it is typical to feel:  Soreness in your throat.  Pain with swallowing.  Sick to your stomach (nauseous).  Bloated.  Dizzy.  Fatigued. HOME CARE INSTRUCTIONS  Do not eat or drink anything until the numbing medicine (local anesthetic) has worn off and your gag reflex has returned. You will know that the local anesthetic has worn off  when you can swallow comfortably.  Do not drive or operate machinery until directed by your health care provider.  Take medicines only as directed by your health care provider. SEEK MEDICAL CARE IF:   You cannot stop coughing.  You are not urinating  at all or less than usual. SEEK IMMEDIATE MEDICAL CARE IF:  You have difficulty swallowing.  You cannot eat or drink.  You have worsening throat or chest pain.  You have dizziness or lightheadedness or you faint.  You have nausea or vomiting.  You have chills.  You have a fever.  You have severe abdominal pain.  You have black, tarry, or bloody stools.   This information is not intended to replace advice given to you by your health care provider. Make sure you discuss any questions you have with your health care provider.   Document Released: 08/25/2012 Document Revised: 09/29/2014 Document Reviewed: 08/25/2012 Elsevier Interactive Patient Education [employer]. Continue current meds. Clear liquids for 4-6 hours, if no chest pain or trouble breathing, soft foods tonight.  Regular diet tomorrow. Call for problems.

## 2015-11-19 NOTE — Anesthesia Preprocedure Evaluation (Signed)
Anesthesia Evaluation  Patient identified by MRN, date of birth, ID band Patient awake    Reviewed: Allergy & Precautions, NPO status , Patient's Chart, lab work & pertinent test results  History of Anesthesia Complications (+) PONV and history of anesthetic complications  Airway Mallampati: II  TM Distance: >3 FB Neck ROM: Full    Dental  (+) Teeth Intact   Pulmonary neg shortness of breath, neg sleep apnea, neg recent URI, neg PE   breath sounds clear to auscultation       Cardiovascular (-) hypertension(-) angina(-) Past MI and (-) CHF  Rhythm:Regular     Neuro/Psych  Headaches, neg Seizures PSYCHIATRIC DISORDERS Depression    GI/Hepatic Neg liver ROS, GERD  Medicated,  Endo/Other  negative endocrine ROS  Renal/GU negative Renal ROS     Musculoskeletal  (+) Arthritis ,   Abdominal   Peds  Hematology   Anesthesia Other Findings   Reproductive/Obstetrics                             Anesthesia Physical Anesthesia Plan  ASA: II  Anesthesia Plan: MAC   Post-op Pain Management:    Induction: Intravenous  Airway Management Planned: Natural Airway, Nasal Cannula and Simple Face Mask  Additional Equipment: None  Intra-op Plan:   Post-operative Plan:   Informed Consent: I have reviewed the patients History and Physical, chart, labs and discussed the procedure including the risks, benefits and alternatives for the proposed anesthesia with the patient or authorized representative who has indicated his/her understanding and acceptance.   Dental advisory given  Plan Discussed with: CRNA and Surgeon  Anesthesia Plan Comments:         Anesthesia Quick Evaluation

## 2015-11-19 NOTE — Transfer of Care (Signed)
Immediate Anesthesia Transfer of Care Note  Patient: Kyle Davidson  Procedure(s) Performed: Procedure(s): ESOPHAGOGASTRODUODENOSCOPY (EGD) WITH PROPOFOL (N/A) SAVORY DILATION (N/A)  Patient Location: PACU  Anesthesia Type:MAC  Level of Consciousness:  sedated, patient cooperative and responds to stimulation  Airway & Oxygen Therapy:Patient Spontanous Breathing and Patient connected to face mask oxgen  Post-op Assessment:  Report given to PACU RN and Post -op Vital signs reviewed and stable  Post vital signs:  Reviewed and stable  Last Vitals:  Filed Vitals:   11/19/15 0911  BP: 169/84  Pulse: 65  Temp: 36.7 C  Resp: 12    Complications: No apparent anesthesia complications

## 2015-11-19 NOTE — H&P (Signed)
Subjective:   Patient is a 65 y.o. male presents with dysphagia. He's had problems for number of years at times his head what appears to be strictures on barium swallow. He is seen ENT. Most recent barium swallow has shown some [place] pharyngeal dysphagia. Patient is continuing to have symptoms and after discussing this with him in the office we decided to go ahead and tried empiric dilatation to see if this would help [place] dysphagia. He did have EGD several years ago that did not clearly show a stricture. . Procedure including risks and benefits discussed in office.  Patient Active Problem List   Diagnosis Date Noted  . Appendicolith 04/03/2014  . Dyspnea on exertion 02/01/2014  . Chest pain 02/01/2014  . Essential hypertension 02/01/2014  . Hyperlipidemia 02/01/2014  . Post-op pain 08/22/2013  . Bilateral inguinal hernia 07/11/2013  . Umbilical hernia [id number]  . Unspecified constipation 07/11/2013  . Angioedema of lips 12/26/2011  . GERD (gastroesophageal reflux disease) 12/26/2011  . Dysphagia 12/26/2011  . Sinusitis 12/26/2011  . Elevated BP 12/26/2011  . Shoulder pain 12/26/2011   Past Medical History  Diagnosis Date  . GERD (gastroesophageal reflux disease)   . Vertebral fracture     x2 neck area  . Arthritis   . PONV (postoperative nausea and vomiting)     occ  . Pneumonia     hx  . Heart murmur     hx  . History of kidney stones     past hx. "was told presntly has one in right kidny-not bothersome.  Marland Kitchen Headache(784.0)     occ  . Cancer ([place])     skin  . Hypertension   . Hx of seasonal allergies   . Depression     hx no rx now 10 yrs ago. no longer needs meds.    Past Surgical History  Procedure Laterality Date  . Shoulder arthroscopy Bilateral     rt 13 lft 15 yrs ago  . Back surgery      x2 15-20 yrs  . Nasal septum surgery    . Hand surgery Left     thumb x2  . Inguinal hernia repair Bilateral 08/16/2013    Procedure: LAPAROSCOPIC BILATERAL INGUINAL  HERNIA WITH UMBILICAL HERNIA;  Surgeon: Joyice Faster. Cornett, MD;  Location: [facility];  Service: General;  Laterality: Bilateral;  . Insertion of mesh Bilateral 08/16/2013    Procedure: INSERTION OF MESH;  Surgeon: Joyice Faster. Cornett, MD;  Location: [facility];  Service: General;  Laterality: Bilateral;    Prescriptions prior to admission  Medication Sig Dispense Refill Last Dose  . acetaminophen (TYLENOL) 500 MG tablet Take 1,000 mg by mouth daily as needed. pain   11/18/2015 at Unknown time  . aspirin EC 81 MG tablet Take 81 mg by mouth daily.   11/18/2015 at Unknown time  . cetirizine (ZYRTEC) 10 MG tablet Take 10 mg by mouth daily.   11/18/2015 at Unknown time  . Omega-3 Fatty Acids (FISH OIL) 1000 MG CAPS Take 2 capsules by mouth 2 (two) times daily.    11/18/2015 at Unknown time  . omeprazole (PRILOSEC) 40 MG capsule Take 40 mg by mouth 2 (two) times daily.  5 11/18/2015 at Unknown time  . rOPINIRole (REQUIP) 0.25 MG tablet Take 0.25 mg by mouth at bedtime.  0 Past Month at Unknown time   Allergies  Allergen Reactions  . Codeine Other (See Comments)    Bad headaches.....makes him see things    Social History  Substance Use Topics  . Smoking status: Never Smoker   . Smokeless tobacco: Never Used  . Alcohol Use: No    Family History  Problem Relation Age of Onset  . Bladder Cancer Father   . Prostate cancer Father      Objective:   Patient Vitals for the past 8 hrs:  BP Temp Temp src Pulse Resp SpO2 Height Weight  11/19/15 0911 (!) 169/84 mmHg 98 F (36.7 C) Oral 65 12 100 % 6\' 3"  (1.905 m) 102.967 kg (227 lb)         See MD Preop evaluation      Assessment:   1. Dysphagia. Has questionable esophageal stricture on barium swallow in the past.  Plan:   After discussion with patient in the office and in view of his continued chronic dysphagia symptoms we elected to proceed with an empiric dilatation to see if this would help. The risk and benefits of this and been discussed with  him in detail.

## 2015-11-19 NOTE — Op Note (Signed)
[facility] [place], [address]   ENDOSCOPY PROCEDURE REPORT  PATIENT: Kyle Davidson, Kyle Davidson  MR#: [id number] BIRTHDATE: 1951-02-25 , 64  yrs. old GENDER: male ENDOSCOPIST:Xitlally Mooneyham Oletta Lamas, MD REFERRED BY: Dr Anderson Malta Couillard PROCEDURE DATE:  11/19/2015 PROCEDURE:   EGD with Azzie Almas Dilatation using fluro ASA CLASS:    Class II INDICATIONS: long history of dysphagia with some barium swallows in the past showing narrowing at the GE junction. MEDICATION: propofol milligrams IV TOPICAL ANESTHETIC:   etacaine spray  DESCRIPTION OF PROCEDURE:   After the risks and benefits of the procedure were explained, informed consent was obtained.  The Pentax Gastroscope [id number]  endoscope was introduced through the mouth  and advanced to the second portion of the duodenum .  The instrument was slowly withdrawn as the mucosa was fully examined. Estimated blood loss is zero unless otherwise noted in this procedure report. The GE junction did not revealmuch of stricture.There was some slight narrowing possible. The scope easily pass. The complete endoscopy was performed and the duodenum including the bulb and 2nd portion were normal. Thescope was withdrawn back into the stomach in the [place] guide wire threaded through the scope in the scope was withdrawn over the guide wire using fluoroscopic guidance. With the patient's neck extended we then passed a 14, 15, and 57mm dilatorwith no blood with any of the dilators. The patient tolerated the procedure well and there were no immediate complications.    The scope was then withdrawn from the patient and the procedure completed.  COMPLICATIONS: There were no immediate complications.  ENDOSCOPIC IMPRESSION: 1. Dysphagia. Minimal stricture seen but was dilated to 16 mm. RECOMMENDATIONS: routine post dilatation orders. We'll see back in the office and the next one to 2 months. Will continue on medications for  reflux.   _______________________________ eSignedLaurence Spates, MD 11/19/2015 10:56 AM     cc: Dr Anderson Malta Couillard  CPT CODES: ICD CODES:  The ICD and CPT codes recommended by this software are interpretations from the data that the clinical staff has captured with the software.  The verification of the translation of this report to the ICD and CPT codes and modifiers is the sole responsibility of the health care institution and practicing physician where this report was generated.  [employer] will not be held responsible for the validity of the ICD and CPT codes included on this report.  AMA assumes no liability for data contained or not contained herein. CPT is a Designer, television/film set of the [employer].

## 2015-11-20 ENCOUNTER — Encounter (HOSPITAL_COMMUNITY): Payer: Self-pay | Admitting: Gastroenterology

## 2015-11-25 ENCOUNTER — Encounter (HOSPITAL_COMMUNITY): Payer: Self-pay | Admitting: Gastroenterology

## 2015-11-25 NOTE — Anesthesia Postprocedure Evaluation (Signed)
Anesthesia Post Note  Patient: Kyle Davidson  Procedure(s) Performed: Procedure(s) (LRB): ESOPHAGOGASTRODUODENOSCOPY (EGD) WITH PROPOFOL (N/A) SAVORY DILATION (N/A)  Patient location during evaluation: Endoscopy Anesthesia Type: MAC Level of consciousness: awake Pain management: pain level controlled Vital Signs Assessment: post-procedure vital signs reviewed and stable Respiratory status: spontaneous breathing Cardiovascular status: stable Postop Assessment: no signs of nausea or vomiting Anesthetic complications: no    Last Vitals:  Filed Vitals:   11/19/15 0911 11/19/15 1052  BP: 169/84 116/74  Pulse: 65 91  Temp: 36.7 C 36.6 C  Resp: 12 20    Last Pain: There were no vitals filed for this visit.               Jihan Rudy

## 2015-11-27 ENCOUNTER — Encounter (HOSPITAL_COMMUNITY): Payer: Self-pay | Admitting: *Deleted

## 2015-11-27 DIAGNOSIS — Z79899 Other long term (current) drug therapy: Secondary | ICD-10-CM | POA: Insufficient documentation

## 2015-11-27 DIAGNOSIS — K219 Gastro-esophageal reflux disease without esophagitis: Secondary | ICD-10-CM | POA: Insufficient documentation

## 2015-11-27 DIAGNOSIS — M1990 Unspecified osteoarthritis, unspecified site: Secondary | ICD-10-CM | POA: Insufficient documentation

## 2015-11-27 DIAGNOSIS — Z87442 Personal history of urinary calculi: Secondary | ICD-10-CM | POA: Insufficient documentation

## 2015-11-27 DIAGNOSIS — Z85828 Personal history of other malignant neoplasm of skin: Secondary | ICD-10-CM | POA: Insufficient documentation

## 2015-11-27 DIAGNOSIS — Z8659 Personal history of other mental and behavioral disorders: Secondary | ICD-10-CM | POA: Insufficient documentation

## 2015-11-27 DIAGNOSIS — Z8781 Personal history of (healed) traumatic fracture: Secondary | ICD-10-CM | POA: Insufficient documentation

## 2015-11-27 DIAGNOSIS — I10 Essential (primary) hypertension: Secondary | ICD-10-CM | POA: Diagnosis not present

## 2015-11-27 DIAGNOSIS — Z7982 Long term (current) use of aspirin: Secondary | ICD-10-CM | POA: Insufficient documentation

## 2015-11-27 DIAGNOSIS — K610 Anal abscess: Secondary | ICD-10-CM | POA: Insufficient documentation

## 2015-11-27 DIAGNOSIS — R011 Cardiac murmur, unspecified: Secondary | ICD-10-CM | POA: Diagnosis not present

## 2015-11-27 DIAGNOSIS — Z8701 Personal history of pneumonia (recurrent): Secondary | ICD-10-CM | POA: Insufficient documentation

## 2015-11-27 DIAGNOSIS — R339 Retention of urine, unspecified: Secondary | ICD-10-CM | POA: Diagnosis not present

## 2015-11-27 NOTE — ED Notes (Signed)
Pt states that he had a colonoscopy this am and was diagnosed with polyp that was removed; pt c/o rectal pain and unable to sit; pt also c/o urinary retention and states that he has only been able to "dribble" and that was a couple of hours ago; pt c/o fever at home and chills

## 2015-11-28 ENCOUNTER — Emergency Department (HOSPITAL_COMMUNITY)
Admission: EM | Admit: 2015-11-28 | Discharge: 2015-11-28 | Disposition: A | Discharge status: Home / Self Care | Payer: Medicare HMO | Attending: Emergency Medicine | Admitting: Emergency Medicine

## 2015-11-28 ENCOUNTER — Encounter (HOSPITAL_COMMUNITY): Payer: Self-pay

## 2015-11-28 ENCOUNTER — Emergency Department (HOSPITAL_COMMUNITY): Payer: Medicare HMO

## 2015-11-28 DIAGNOSIS — K610 Anal abscess: Secondary | ICD-10-CM

## 2015-11-28 DIAGNOSIS — R339 Retention of urine, unspecified: Secondary | ICD-10-CM

## 2015-11-28 LAB — BASIC METABOLIC PANEL
ANION GAP: 12 (ref 5–15)
BUN: 14 mg/dL (ref 6–20)
CALCIUM: 9.1 mg/dL (ref 8.9–10.3)
CO2: 20 mmol/L — ABNORMAL LOW (ref 22–32)
Chloride: 106 mmol/L (ref 101–111)
Creatinine, Ser: 0.71 mg/dL (ref 0.61–1.24)
Glucose, Bld: 125 mg/dL — ABNORMAL HIGH (ref 65–99)
POTASSIUM: 3.6 mmol/L (ref 3.5–5.1)
SODIUM: 138 mmol/L (ref 135–145)

## 2015-11-28 LAB — CBC WITH DIFFERENTIAL/PLATELET
BASOS ABS: 0 10*3/uL (ref 0.0–0.1)
BASOS PCT: 0 %
EOS ABS: 0 10*3/uL (ref 0.0–0.7)
EOS PCT: 0 %
HCT: 45.1 % (ref 39.0–52.0)
Hemoglobin: 16.2 g/dL (ref 13.0–17.0)
LYMPHS PCT: 14 %
Lymphs Abs: 1.4 10*3/uL (ref 0.7–4.0)
MCH: 33.9 pg (ref 26.0–34.0)
MCHC: 35.9 g/dL (ref 30.0–36.0)
MCV: 94.4 fL (ref 78.0–100.0)
MONO ABS: 1.2 10*3/uL — AB (ref 0.1–1.0)
Monocytes Relative: 13 %
Neutro Abs: 7.1 10*3/uL (ref 1.7–7.7)
Neutrophils Relative %: 73 %
PLATELETS: 199 10*3/uL (ref 150–400)
RBC: 4.78 MIL/uL (ref 4.22–5.81)
RDW: 12.4 % (ref 11.5–15.5)
WBC: 9.7 10*3/uL (ref 4.0–10.5)

## 2015-11-28 LAB — URINE MICROSCOPIC-ADD ON
SQUAMOUS EPITHELIAL / LPF: NONE SEEN
WBC UA: NONE SEEN WBC/hpf (ref 0–5)

## 2015-11-28 LAB — URINALYSIS, ROUTINE W REFLEX MICROSCOPIC
Bilirubin Urine: NEGATIVE
Glucose, UA: NEGATIVE mg/dL
KETONES UR: NEGATIVE mg/dL
Leukocytes, UA: NEGATIVE
Nitrite: NEGATIVE
PROTEIN: NEGATIVE mg/dL
SPECIFIC GRAVITY, URINE: 1.019 (ref 1.005–1.030)
pH: 6 (ref 5.0–8.0)

## 2015-11-28 MED ORDER — OXYCODONE-ACETAMINOPHEN 5-325 MG PO TABS: 1.0000 | ORAL_TABLET | ORAL | Status: DC | PRN

## 2015-11-28 MED ORDER — MORPHINE SULFATE (PF) 4 MG/ML IV SOLN
4.0000 mg | Freq: Once | INTRAVENOUS | Status: AC
  Administered 2015-11-28: 4 mg via INTRAVENOUS

## 2015-11-28 MED ORDER — LIDOCAINE HCL 2 % IJ SOLN
10.0000 mL | Freq: Once | INTRAMUSCULAR | Status: AC
  Administered 2015-11-28: 200 mg
  Filled 2015-11-28: qty 20

## 2015-11-28 MED ORDER — IOHEXOL 300 MG/ML  SOLN
100.0000 mL | Freq: Once | INTRAMUSCULAR | Status: AC | PRN
  Administered 2015-11-28: 100 mL via INTRAVENOUS

## 2015-11-28 MED ORDER — METRONIDAZOLE 500 MG PO TABS: 500.0000 mg | ORAL_TABLET | Freq: Three times a day (TID) | ORAL | Status: DC

## 2015-11-28 MED ORDER — IOHEXOL 300 MG/ML  SOLN
25.0000 mL | Freq: Once | INTRAMUSCULAR | Status: AC | PRN
  Administered 2015-11-28: 25 mL via ORAL

## 2015-11-28 MED ORDER — DOCUSATE SODIUM 100 MG PO CAPS: 100.0000 mg | ORAL_CAPSULE | Freq: Two times a day (BID) | ORAL | Status: DC

## 2015-11-28 MED ORDER — ONDANSETRON HCL 4 MG/2ML IJ SOLN
INTRAMUSCULAR | Status: AC
  Filled 2015-11-28: qty 2

## 2015-11-28 MED ORDER — ONDANSETRON HCL 4 MG/2ML IJ SOLN
4.0000 mg | Freq: Once | INTRAMUSCULAR | Status: AC
  Administered 2015-11-28: 4 mg via INTRAVENOUS

## 2015-11-28 MED ORDER — MORPHINE SULFATE (PF) 4 MG/ML IV SOLN
INTRAVENOUS | Status: AC
  Filled 2015-11-28: qty 1

## 2015-11-28 MED ORDER — TAMSULOSIN HCL 0.4 MG PO CAPS: 0.4000 mg | ORAL_CAPSULE | Freq: Every day | ORAL | Status: DC

## 2015-11-28 MED ORDER — CIPROFLOXACIN HCL 500 MG PO TABS: 500.0000 mg | ORAL_TABLET | Freq: Two times a day (BID) | ORAL | Status: DC

## 2015-11-28 NOTE — ED Notes (Signed)
MD at bedside. 

## 2015-11-28 NOTE — ED Provider Notes (Signed)
CSN: [id number]     Arrival date & time 11/27/15  2001 History   By signing my name below, I, Kyle Davidson, attest that this documentation has been prepared under the direction and in the presence of Kyle Greek, MD.  Electronically Signed: Forrestine Davidson, ED Scribe. 11/28/2015. 2:59 AM.   Chief Complaint  Patient presents with  . Urinary Retention  . Rectal Pain   The history is provided by the patient. No language interpreter was used.    HPI Comments: Kyle Davidson is a 65 y.o. male with a PMHx of HTN who presents to the Emergency Department here for urinary retention this evening. Pt states he has not been able to urinate with recent output described as "a dribble" x 1 day. No prior history of same. Pt also reports constant, ongoing lower abdominal discomfort and fullness. No aggravating or alleviating factors at this time. No recent fever, chills, nausea, vomiting, or diarrhea. He denies any dysuria or hematuria at this time.  Kyle Davidson states he had a colonoscopy performed earlier this morning which revealed a polyp that was removed. He now c/o constant, ongoing rectal pain. Discomfort is exacerbated when sitting down and mildly alleviated when off of his bottom. No OTC medications or home remedies attempted prior to arrival. Pt states discomfort is related to a known external hemorrhoid which he plans to have removed.  PCP: Kyle Shire, MD    Past Medical History  Diagnosis Date  . GERD (gastroesophageal reflux disease)   . Vertebral fracture     x2 neck area  . Arthritis   . PONV (postoperative nausea and vomiting)     occ  . Pneumonia     hx  . Heart murmur     hx  . History of kidney stones     past hx. "was told presntly has one in right kidny-not bothersome.  Marland Kitchen Headache(784.0)     occ  . Cancer ([place])     skin  . Hypertension   . Hx of seasonal allergies   . Depression     hx no rx now 10 yrs ago. no longer needs meds.   Past Surgical History   Procedure Laterality Date  . Shoulder arthroscopy Bilateral     rt 13 lft 15 yrs ago  . Back surgery      x2 15-20 yrs  . Nasal septum surgery    . Hand surgery Left     thumb x2  . Inguinal hernia repair Bilateral 08/16/2013    Procedure: LAPAROSCOPIC BILATERAL INGUINAL HERNIA WITH UMBILICAL HERNIA;  Surgeon: Joyice Faster. Cornett, MD;  Location: [facility];  Service: General;  Laterality: Bilateral;  . Insertion of mesh Bilateral 08/16/2013    Procedure: INSERTION OF MESH;  Surgeon: Joyice Faster. Cornett, MD;  Location: [facility];  Service: General;  Laterality: Bilateral;  . Esophagogastroduodenoscopy (egd) with propofol N/A 11/19/2015    Procedure: ESOPHAGOGASTRODUODENOSCOPY (EGD) WITH PROPOFOL;  Surgeon: Laurence Spates, MD;  Location: [facility];  Service: Endoscopy;  Laterality: N/A;  . Savory dilation N/A 11/19/2015    Procedure: SAVORY DILATION;  Surgeon: Laurence Spates, MD;  Location: [facility];  Service: Endoscopy;  Laterality: N/A;   Family History  Problem Relation Age of Onset  . Bladder Cancer Father   . Prostate cancer Father    Social History  Substance Use Topics  . Smoking status: Never Smoker   . Smokeless tobacco: Never Used  . Alcohol Use: No    Review of  Systems  Constitutional: Negative for fever and chills.  Respiratory: Negative for cough and shortness of breath.   Gastrointestinal: Positive for rectal pain. Negative for nausea, vomiting and abdominal pain.  Genitourinary: Positive for difficulty urinating.  Neurological: Negative for headaches.  Psychiatric/Behavioral: Negative for confusion.  All other systems reviewed and are negative.     Allergies  Codeine  Home Medications   Prior to Admission medications   Medication Sig Start Date End Date Taking? Authorizing Provider  acetaminophen (TYLENOL) 500 MG tablet Take 1,000 mg by mouth daily as needed for mild pain, moderate pain or headache.    Yes Historical Provider, MD  aspirin EC 81 MG tablet Take 81  mg by mouth daily.   Yes Historical Provider, MD  cetirizine (ZYRTEC) 10 MG tablet Take 10 mg by mouth daily.   Yes Historical Provider, MD  Omega-3 Fatty Acids (FISH OIL) 1000 MG CAPS Take 2 capsules by mouth 2 (two) times daily.    Yes Historical Provider, MD  omeprazole (PRILOSEC) 40 MG capsule Take 40 mg by mouth 2 (two) times daily. 10/17/15  Yes Historical Provider, MD  ciprofloxacin (CIPRO) 500 MG tablet Take 1 tablet (500 mg total) by mouth 2 (two) times daily. 11/28/15   Kyle Greek, MD  docusate sodium (COLACE) 100 MG capsule Take 1 capsule (100 mg total) by mouth every 12 (twelve) hours. 11/28/15   Kyle Greek, MD  metroNIDAZOLE (FLAGYL) 500 MG tablet Take 1 tablet (500 mg total) by mouth 3 (three) times daily. 11/28/15   Kyle Greek, MD  oxyCODONE-acetaminophen (PERCOCET) 5-325 MG tablet Take 1 tablet by mouth every 4 (four) hours as needed. 11/28/15   Kyle Greek, MD  tamsulosin (FLOMAX) 0.4 MG CAPS capsule Take 1 capsule (0.4 mg total) by mouth daily. 11/28/15   Kyle Greek, MD   Triage Vitals: BP 139/79 mmHg  Pulse 68  Temp(Src) 98.5 F (36.9 C) (Oral)  Resp 18  SpO2 97%   Physical Exam  Constitutional: He is oriented to person, place, and time. He appears well-developed and well-nourished. No distress.  HENT:  Head: Normocephalic and atraumatic.  Right Ear: Hearing normal.  Left Ear: Hearing normal.  Nose: Nose normal.  Mouth/Throat: Oropharynx is clear and moist and mucous membranes are normal.  Eyes: Conjunctivae and EOM are normal. Pupils are equal, round, and reactive to light.  Neck: Normal range of motion. Neck supple.  Cardiovascular: Regular rhythm, S1 normal and S2 normal.  Exam reveals no gallop and no friction rub.   No murmur heard. Pulmonary/Chest: Effort normal and breath sounds normal. No respiratory distress. He exhibits no tenderness.  Abdominal: Soft. Normal appearance and bowel sounds are normal. There is no  hepatosplenomegaly. There is tenderness. There is no rebound, no guarding, no tenderness at McBurney's point and negative Murphy's sign. No hernia.  Suprapubic tenderness and fullness   Genitourinary:  Tender, swollen, indurated and fluctuant area posterior aspect of the perianal region. No drainage.  Musculoskeletal: Normal range of motion.  Neurological: He is alert and oriented to person, place, and time. He has normal strength. No cranial nerve deficit or sensory deficit. Coordination normal. GCS eye subscore is 4. GCS verbal subscore is 5. GCS motor subscore is 6.  Skin: Skin is warm, dry and intact. No rash noted. No cyanosis.  Psychiatric: He has a normal mood and affect. His speech is normal and behavior is normal. Thought content normal.  Nursing note and vitals reviewed.   ED Course  Procedures (including critical care time)  INCISION AND DRAINAGE Performed by: Kyle Davidson. Consent: Verbal consent obtained. Risks and benefits: risks, benefits and alternatives were discussed Type: abscess  Body area: perianal  Anesthesia: local infiltration  Incision was made with a scalpel.  Local anesthetic: lidocaine 2% w/o epinephrine  Anesthetic total: 5 ml  Complexity: complex Blunt dissection to break up loculations  Drainage: purulent  Drainage amount: copious  Packing material: 1/4 in iodoform gauze  Patient tolerance: Patient tolerated the procedure well with no immediate complications.     DIAGNOSTIC STUDIES: Oxygen Saturation is 96% on RA, adequate by my interpretation.    COORDINATION OF CARE: 2:52 AM- Will order urinalysis and bladder scan. Discussed treatment plan with pt at bedside and pt agreed to plan.     Labs Review Labs Reviewed  URINALYSIS, ROUTINE W REFLEX MICROSCOPIC (NOT AT [facility]) - Abnormal; Notable for the following:    APPearance CLOUDY (*)    Hgb urine dipstick SMALL (*)    All other components within normal limits  CBC WITH  DIFFERENTIAL/PLATELET - Abnormal; Notable for the following:    Monocytes Absolute 1.2 (*)    All other components within normal limits  BASIC METABOLIC PANEL - Abnormal; Notable for the following:    CO2 20 (*)    Glucose, Bld 125 (*)    All other components within normal limits  URINE MICROSCOPIC-ADD ON - Abnormal; Notable for the following:    Bacteria, UA RARE (*)    All other components within normal limits  CULTURE, ROUTINE-ABSCESS    Imaging Review Ct Abdomen Pelvis W Contrast  11/28/2015  CLINICAL DATA:  Rectal pain in urinary retention for 1 day. Lower abdominal discomfort. Microhematuria. Post colonoscopy on 11/27/2015 with polyp removed. EXAM: CT ABDOMEN AND PELVIS WITH CONTRAST TECHNIQUE: Multidetector CT imaging of the abdomen and pelvis was performed using the standard protocol following bolus administration of intravenous contrast. CONTRAST:  15mL OMNIPAQUE IOHEXOL 300 MG/ML  SOLN COMPARISON:  04/07/2014 FINDINGS: Atelectasis in the lung bases. Mild diffuse fatty infiltration in the liver. The gallbladder, spleen, pancreas, adrenal glands, abdominal aorta, inferior vena cava, and retroperitoneal lymph nodes are unremarkable. Several stones in the right kidney, largest measuring 9 mm diameter. No hydronephrosis or hydroureter in either kidney. Stomach, small bowel, and colon are not abnormally distended. No free air or free fluid in the abdomen. Abdominal wall musculature appears intact. Pelvis: Foley catheter decompresses the bladder. Air in the bladder likely from Foley catheter. Prostate gland is enlarged measuring 4.7 cm diameter. No pelvic mass or lymphadenopathy. No free or loculated pelvic fluid collections. Appendix is normal. Posterior perianal fluid collection measuring 3.5 by 2.6 cm consistent with perianal abscess. IMPRESSION: Posterior perianal abscess measuring 3.5 cm maximal diameter. Prostate enlargement. Foley catheter. Fatty infiltration of the liver. Nonobstructing  right renal stones. Electronically Signed   By: Lucienne Capers M.D.   On: 11/28/2015 05:09   I have personally reviewed and evaluated these images and lab results as part of my medical decision-making.   EKG Interpretation None      MDM   Final diagnoses:  Urinary retention  Perianal abscess    Patient presented to the ER for evaluation of acute urinary retention. Patient reports that over the past several weeks he has been noticing difficulty with urination, decreased stream and occasional dribbling. Today, however, he has been unable to urinate at all. Bladder scan revealed more than a liter of urine in his bladder. A Foley catheter was  placed in his bladder was drained. There is no evidence of acute renal failure associated with outlet obstruction.  Patient also complaining of rectal pain. He reports that this has been ongoing for some time. He has had at least 2 weeks or more progressive worsening pain in the rectal area. Patient is now complaining of severe pain and cannot even sit because of the pain. Examination of his rectum reveals evidence of abscess. CT scan performed to identify the depth. It is a continuous perianal abscess, no extension into the pelvis. This area was therefore drained in the ER. He was placed on empiric antibiotic coverage. Attempt follow-up with  General surgery in the office this week for pack removal, return to the ER if he cannot  Obtain prompt follow-up. He also already has follow-up scheduled with urology.  I personally performed the services described in this documentation, which was scribed in my presence. The recorded information has been reviewed and is accurate.    Kyle Greek, MD 11/28/15 [phone]

## 2015-11-28 NOTE — Discharge Instructions (Signed)
Acute Urinary Retention, Male Acute urinary retention is the temporary inability to urinate. This is a common problem in older men. As men age their prostates become larger and block the flow of urine from the bladder. This is usually a problem that has come on gradually.  HOME CARE INSTRUCTIONS If you are sent home with a Foley catheter and a drainage system, you will need to discuss the best course of action with your health care provider. While the catheter is in, maintain a good intake of fluids. Keep the drainage bag emptied and lower than your catheter. This is so that contaminated urine will not flow back into your bladder, which could lead to a urinary tract infection. There are two main types of drainage bags. One is a large bag that usually is used at night. It has a good capacity that will allow you to sleep through the night without having to empty it. The second type is called a leg bag. It has a smaller capacity, so it needs to be emptied more frequently. However, the main advantage is that it can be attached by a leg strap and can go underneath your clothing, allowing you the freedom to move about or leave your home. Only take over-the-counter or prescription medicines for pain, discomfort, or fever as directed by your health care provider.  SEEK MEDICAL CARE IF:  You develop a low-grade fever.  You experience spasms or leakage of urine with the spasms. SEEK IMMEDIATE MEDICAL CARE IF:   You develop chills or fever.  Your catheter stops draining urine.  Your catheter falls out.  You start to develop increased bleeding that does not respond to rest and increased fluid intake. MAKE SURE YOU:  Understand these instructions.  Will watch your condition.  Will get help right away if you are not doing well or get worse.   This information is not intended to replace advice given to you by your health care provider. Make sure you discuss any questions you have with your health care  provider.   Document Released: 12/15/2000 Document Revised: 01/23/2015 Document Reviewed: 02/17/2013 Elsevier Interactive Patient Education 2016 Elsevier Inc.  Perianal Abscess An abscess is an infected area that contains a collection of pus and debris. A perianal abscess is one that occurs in the perineal area, which is the area between the anus and the scrotum in males and between the anus and the vagina in females. Perianal abscesses can vary in size. Without treatment, a perianal abscess can become larger and cause other problems. CAUSES  Glands in the perineal area can become plugged up with debris. When this happens, an abscess may form.  SIGNS AND SYMPTOMS  The most common symptoms of a perianal abscess are:  Swelling and redness in the area of the abscess. The redness may go beyond the abscess and appear as a red streak on the skin.  Pain in the area of the abscess. Other possible symptoms include:   A visible lump or a lump that can be felt when touching the area and is usually painful.  Bleeding or pus-like discharge from the area.  Fever.  General weakness. DIAGNOSIS  Your health care provider will take a medical history and examine the area. This may involve examining the rectal area with a gloved hand (digital rectal exam). For women, it may require a careful vaginal exam. Sometimes, the health care provider needs to look into the rectum using a probe or scope. TREATMENT  Treatment often requires making  a cut (incision) in the abscess to drain the pus. This can sometimes be done in your health care provider's office or an emergency department after giving you medicine to numb the area (local anesthetic). For larger or deeper abscesses, surgery may be required to drain the abscess. Antibiotic medicines are sometimes given if there is infection of the surrounding tissue (cellulitis). In some cases, gauze is packed into the abscess to continue draining the area. Frequent sitz  baths may be recommended to help the wound heal and to reduce the chance of the abscess coming back. HOME CARE INSTRUCTIONS   Only take over-the-counter or prescription medicines for pain, fever, or discomfort as directed by your health care provider.  Take antibiotic medicine as directed. Make sure you finish it even if you start to feel better.  If gauze is used in the abscess, follow your health care provider's instructions for removing or changing the gauze. It can usually be removed in 2-3 days.  If one or more drains have been placed in the abscess cavity, be careful not to pull at them. Your health care provider will tell you how long they need to remain in place.  Take warm sitz baths 3-4 times a day and after bowel movements. This will help reduce pain and swelling.  Keep the skin around the abscess clean and dry. Avoid cleaning the area too much.  Avoid scratching the abscess area.  Avoid using colored or perfumed toilet papers. SEEK MEDICAL CARE IF:   You have trouble having a bowel movement or passing urine.  Your pain or swelling in the affected area does not seem to be improving.  The gauze packing or the drains come out before the planned time. SEEK IMMEDIATE MEDICAL CARE IF:   You have problems moving or using your legs.  You have severe or increasing pain.  Your swelling in the affected area suddenly gets worse.  You have a large increase in bleeding or passing of pus.  You have chills or a fever. MAKE SURE YOU:   Understand these instructions.  Will watch your condition.  Will get help right away if you are not doing well or get worse.   This information is not intended to replace advice given to you by your health care provider. Make sure you discuss any questions you have with your health care provider.   Document Released: 10/15/2006 Document Revised: 06/29/2013 Document Reviewed: 04/20/2013 Elsevier Interactive Patient Education 2016 Elsevier  Inc.  Incision and Drainage Incision and drainage is a procedure in which a sac-like structure (cystic structure) is opened and drained. The area to be drained usually contains material such as pus, fluid, or blood.  LET YOUR CAREGIVER KNOW ABOUT:   Allergies to medicine.  Medicines taken, including vitamins, herbs, eyedrops, over-the-counter medicines, and creams.  Use of steroids (by mouth or creams).  Previous problems with anesthetics or numbing medicines.  History of bleeding problems or blood clots.  Previous surgery.  Other health problems, including diabetes and kidney problems.  Possibility of pregnancy, if this applies. RISKS AND COMPLICATIONS  Pain.  Bleeding.  Scarring.  Infection. BEFORE THE PROCEDURE  You may need to have an ultrasound or other imaging tests to see how large or deep your cystic structure is. Blood tests may also be used to determine if you have an infection or how severe the infection is. You may need to have a tetanus shot. PROCEDURE  The affected area is cleaned with a cleaning fluid. The  cyst area will then be numbed with a medicine (local anesthetic). A small incision will be made in the cystic structure. A syringe or catheter may be used to drain the contents of the cystic structure, or the contents may be squeezed out. The area will then be flushed with a cleansing solution. After cleansing the area, it is often gently packed with a gauze or another wound dressing. Once it is packed, it will be covered with gauze and tape or some other type of wound dressing. AFTER THE PROCEDURE   Often, you will be allowed to go home right after the procedure.  You may be given antibiotic medicine to prevent or heal an infection.  If the area was packed with gauze or some other wound dressing, you will likely need to come back in 1 to 2 days to get it removed.  The area should heal in about 14 days.   This information is not intended to replace advice  given to you by your health care provider. Make sure you discuss any questions you have with your health care provider.   Document Released: 03/04/2001 Document Revised: 03/09/2012 Document Reviewed: 11/03/2011 Elsevier Interactive Patient Education [employer].

## 2015-11-28 NOTE — ED Notes (Signed)
Bladder scanner completed. Patient's bladder has >999 mL in bladder.

## 2015-11-28 NOTE — ED Notes (Signed)
Discharge instructions, follow up care, and prescriptions reviewed with patient. Patient verbalized understanding. Patient given leg bag to go home.

## 2015-11-28 NOTE — ED Notes (Signed)
Patient transported to CT 

## 2015-11-30 LAB — CULTURE, ROUTINE-ABSCESS

## 2015-12-02 ENCOUNTER — Telehealth: Payer: Self-pay | Admitting: *Deleted

## 2015-12-02 NOTE — ED Notes (Signed)
Post ED Visit - Positive Culture Follow-up  Culture report reviewed by antimicrobial stewardship pharmacist:  []  Kyle Davidson, Pharm.D. []  Kyle Davidson, Pharm.D., BCPS [x]  Kyle Davidson, Pharm.D. []  Kyle Davidson, Pharm.D., BCPS []  Kyle Davidson, Pharm.D., BCPS, AAHIVP []  Kyle Davidson, Pharm.D., BCPS, AAHIVP []  Kyle Davidson, Pharm.D. []  Kyle Davidson, Pharm.D.  Positive wound culture Treated with Cipro/Metronidazole organism sensitive to the same and no further patient follow-up is required at this time.  Kyle Davidson [facility] 12/02/2015, 9:20 AM

## 2015-12-04 ENCOUNTER — Other Ambulatory Visit: Payer: Self-pay | Admitting: Urology

## 2015-12-07 ENCOUNTER — Encounter (HOSPITAL_COMMUNITY): Payer: Self-pay | Admitting: *Deleted

## 2015-12-07 ENCOUNTER — Ambulatory Visit (HOSPITAL_COMMUNITY)
Admission: RE | Admit: 2015-12-07 | Discharge: 2015-12-07 | Disposition: A | Discharge status: Home / Self Care | Payer: Medicare HMO | Source: Ambulatory Visit | Attending: Urology | Admitting: Urology

## 2015-12-07 ENCOUNTER — Other Ambulatory Visit: Payer: Self-pay

## 2015-12-07 ENCOUNTER — Telehealth (HOSPITAL_COMMUNITY): Payer: Self-pay | Admitting: *Deleted

## 2015-12-07 DIAGNOSIS — Z01818 Encounter for other preprocedural examination: Secondary | ICD-10-CM | POA: Diagnosis present

## 2015-12-07 DIAGNOSIS — N200 Calculus of kidney: Secondary | ICD-10-CM | POA: Diagnosis not present

## 2015-12-10 ENCOUNTER — Encounter (HOSPITAL_COMMUNITY): Payer: Self-pay | Admitting: *Deleted

## 2015-12-10 ENCOUNTER — Ambulatory Visit (HOSPITAL_COMMUNITY): Payer: Medicare HMO

## 2015-12-10 ENCOUNTER — Encounter (HOSPITAL_COMMUNITY)
Admission: RE | Disposition: A | Discharge status: Home / Self Care | Payer: Self-pay | Source: Ambulatory Visit | Attending: Urology

## 2015-12-10 ENCOUNTER — Ambulatory Visit (HOSPITAL_COMMUNITY)
Admission: RE | Admit: 2015-12-10 | Discharge: 2015-12-10 | Disposition: A | Discharge status: Home / Self Care | Payer: Medicare HMO | Source: Ambulatory Visit | Attending: Urology | Admitting: Urology

## 2015-12-10 DIAGNOSIS — N201 Calculus of ureter: Secondary | ICD-10-CM

## 2015-12-10 HISTORY — PX: EXTRACORPOREAL SHOCK WAVE LITHOTRIPSY: SHX1557

## 2015-12-10 SURGERY — LITHOTRIPSY, ESWL
Anesthesia: LOCAL | Laterality: Right

## 2015-12-10 MED ORDER — DIAZEPAM 5 MG PO TABS
10.0000 mg | ORAL_TABLET | ORAL | Status: AC
  Administered 2015-12-10: 10 mg via ORAL
  Filled 2015-12-10: qty 2

## 2015-12-10 MED ORDER — CIPROFLOXACIN HCL 500 MG PO TABS
500.0000 mg | ORAL_TABLET | ORAL | Status: AC
  Administered 2015-12-10: 500 mg via ORAL
  Filled 2015-12-10: qty 1

## 2015-12-10 MED ORDER — DIPHENHYDRAMINE HCL 25 MG PO CAPS
25.0000 mg | ORAL_CAPSULE | ORAL | Status: AC
  Administered 2015-12-10: 25 mg via ORAL
  Filled 2015-12-10: qty 1

## 2015-12-10 MED ORDER — SODIUM CHLORIDE 0.9 % IV SOLN
INTRAVENOUS | Status: DC
  Administered 2015-12-10: 11:00:00 via INTRAVENOUS

## 2015-12-10 NOTE — Discharge Instructions (Signed)
SEE [facility] DISCHARGE INSTRUCTIONS

## 2016-01-29 DIAGNOSIS — G629 Polyneuropathy, unspecified: Secondary | ICD-10-CM | POA: Insufficient documentation

## 2016-01-29 DIAGNOSIS — R519 Headache, unspecified: Secondary | ICD-10-CM | POA: Insufficient documentation

## 2016-01-29 DIAGNOSIS — G2581 Restless legs syndrome: Secondary | ICD-10-CM | POA: Insufficient documentation

## 2016-01-29 DIAGNOSIS — K219 Gastro-esophageal reflux disease without esophagitis: Secondary | ICD-10-CM | POA: Insufficient documentation

## 2016-03-14 ENCOUNTER — Encounter (HOSPITAL_BASED_OUTPATIENT_CLINIC_OR_DEPARTMENT_OTHER): Payer: Self-pay | Admitting: *Deleted

## 2016-03-14 NOTE — Progress Notes (Signed)
NPO AFTER MN.  ARRIVE AT 0630.  NEEDS HG.  WILL TAKE AM MEDS W/ SIPS OF WATER.  PRE-OP ORDERS PENDING.

## 2016-03-19 ENCOUNTER — Other Ambulatory Visit: Payer: Self-pay | Admitting: General Surgery

## 2016-03-21 ENCOUNTER — Ambulatory Visit (HOSPITAL_BASED_OUTPATIENT_CLINIC_OR_DEPARTMENT_OTHER)
Admission: RE | Admit: 2016-03-21 | Discharge: 2016-03-21 | Disposition: A | Discharge status: Home / Self Care | Payer: Medicare HMO | Source: Ambulatory Visit | Attending: General Surgery | Admitting: General Surgery

## 2016-03-21 ENCOUNTER — Ambulatory Visit (HOSPITAL_BASED_OUTPATIENT_CLINIC_OR_DEPARTMENT_OTHER): Payer: Medicare HMO | Admitting: Anesthesiology

## 2016-03-21 ENCOUNTER — Encounter (HOSPITAL_BASED_OUTPATIENT_CLINIC_OR_DEPARTMENT_OTHER)
Admission: RE | Disposition: A | Discharge status: Home / Self Care | Payer: Self-pay | Source: Ambulatory Visit | Attending: General Surgery

## 2016-03-21 ENCOUNTER — Encounter (HOSPITAL_BASED_OUTPATIENT_CLINIC_OR_DEPARTMENT_OTHER): Payer: Self-pay | Admitting: *Deleted

## 2016-03-21 DIAGNOSIS — K219 Gastro-esophageal reflux disease without esophagitis: Secondary | ICD-10-CM | POA: Insufficient documentation

## 2016-03-21 DIAGNOSIS — K603 Anal fistula: Secondary | ICD-10-CM | POA: Diagnosis present

## 2016-03-21 DIAGNOSIS — Z79899 Other long term (current) drug therapy: Secondary | ICD-10-CM | POA: Diagnosis not present

## 2016-03-21 DIAGNOSIS — I10 Essential (primary) hypertension: Secondary | ICD-10-CM | POA: Diagnosis not present

## 2016-03-21 DIAGNOSIS — K640 First degree hemorrhoids: Secondary | ICD-10-CM | POA: Diagnosis not present

## 2016-03-21 DIAGNOSIS — Z7982 Long term (current) use of aspirin: Secondary | ICD-10-CM | POA: Insufficient documentation

## 2016-03-21 HISTORY — DX: Diaphragmatic hernia without obstruction or gangrene: K44.9

## 2016-03-21 HISTORY — DX: Anal fistula: K60.3

## 2016-03-21 HISTORY — DX: Personal history of (healed) traumatic fracture: Z87.81

## 2016-03-21 HISTORY — DX: Personal history of other malignant neoplasm of skin: Z85.828

## 2016-03-21 HISTORY — DX: Unspecified symptoms and signs involving the genitourinary system: R39.9

## 2016-03-21 HISTORY — DX: Urinary tract infection, site not specified: N39.0

## 2016-03-21 HISTORY — DX: Other specified postprocedural states: Z98.890

## 2016-03-21 HISTORY — PX: FISTULOTOMY: SHX6413

## 2016-03-21 HISTORY — DX: Rash and other nonspecific skin eruption: R21

## 2016-03-21 HISTORY — DX: Anal fistula, unspecified: K60.30

## 2016-03-21 HISTORY — DX: Benign prostatic hyperplasia without lower urinary tract symptoms: N40.0

## 2016-03-21 HISTORY — DX: Personal history of other specified conditions: Z87.898

## 2016-03-21 LAB — POCT HEMOGLOBIN-HEMACUE: HEMOGLOBIN: 16.8 g/dL (ref 13.0–17.0)

## 2016-03-21 SURGERY — EXAM UNDER ANESTHESIA
Anesthesia: Monitor Anesthesia Care | Site: Rectum

## 2016-03-21 MED ORDER — LIDOCAINE HCL (CARDIAC) 20 MG/ML IV SOLN
INTRAVENOUS | Status: AC
  Filled 2016-03-21: qty 5

## 2016-03-21 MED ORDER — PROMETHAZINE HCL 25 MG/ML IJ SOLN
6.2500 mg | INTRAMUSCULAR | Status: DC | PRN
  Filled 2016-03-21: qty 1

## 2016-03-21 MED ORDER — PROPOFOL 500 MG/50ML IV EMUL
INTRAVENOUS | Status: AC
  Filled 2016-03-21: qty 50

## 2016-03-21 MED ORDER — HYDROMORPHONE HCL 1 MG/ML IJ SOLN
0.2500 mg | INTRAMUSCULAR | Status: DC | PRN
  Filled 2016-03-21: qty 1

## 2016-03-21 MED ORDER — LIDOCAINE HCL (CARDIAC) 20 MG/ML IV SOLN
INTRAVENOUS | Status: DC | PRN
  Administered 2016-03-21: 60 mg via INTRAVENOUS

## 2016-03-21 MED ORDER — FENTANYL CITRATE (PF) 100 MCG/2ML IJ SOLN
INTRAMUSCULAR | Status: DC | PRN
  Administered 2016-03-21 (×2): 12.5 ug via INTRAVENOUS
  Administered 2016-03-21: 25 ug via INTRAVENOUS
  Administered 2016-03-21 (×4): 12.5 ug via INTRAVENOUS

## 2016-03-21 MED ORDER — BUPIVACAINE-EPINEPHRINE 0.5% -1:200000 IJ SOLN
INTRAMUSCULAR | Status: DC | PRN
  Administered 2016-03-21: 30 mL

## 2016-03-21 MED ORDER — CHLORHEXIDINE GLUCONATE CLOTH 2 % EX PADS
6.0000 | MEDICATED_PAD | Freq: Once | CUTANEOUS | Status: DC
  Filled 2016-03-21: qty 6

## 2016-03-21 MED ORDER — ONDANSETRON HCL 4 MG/2ML IJ SOLN
INTRAMUSCULAR | Status: DC | PRN
  Administered 2016-03-21: 4 mg via INTRAVENOUS

## 2016-03-21 MED ORDER — ONDANSETRON HCL 4 MG/2ML IJ SOLN
INTRAMUSCULAR | Status: AC
  Filled 2016-03-21: qty 2

## 2016-03-21 MED ORDER — PROPOFOL 500 MG/50ML IV EMUL
INTRAVENOUS | Status: DC | PRN
  Administered 2016-03-21: 25 ug/kg/min via INTRAVENOUS

## 2016-03-21 MED ORDER — FENTANYL CITRATE (PF) 100 MCG/2ML IJ SOLN
INTRAMUSCULAR | Status: AC
  Filled 2016-03-21: qty 2

## 2016-03-21 MED ORDER — MIDAZOLAM HCL 2 MG/2ML IJ SOLN
INTRAMUSCULAR | Status: AC
  Filled 2016-03-21: qty 2

## 2016-03-21 MED ORDER — LACTATED RINGERS IV SOLN
INTRAVENOUS | Status: DC
  Administered 2016-03-21: 07:00:00 via INTRAVENOUS
  Filled 2016-03-21: qty 1000

## 2016-03-21 MED ORDER — OXYCODONE HCL 5 MG PO TABS: 5.0000 mg | ORAL_TABLET | ORAL | Status: DC | PRN

## 2016-03-21 MED ORDER — LIDOCAINE 5 % EX OINT
TOPICAL_OINTMENT | CUTANEOUS | Status: DC | PRN
  Administered 2016-03-21: 1

## 2016-03-21 MED ORDER — PROPOFOL 10 MG/ML IV BOLUS
INTRAVENOUS | Status: DC | PRN
  Administered 2016-03-21: 20 mg via INTRAVENOUS

## 2016-03-21 MED ORDER — KETOROLAC TROMETHAMINE 30 MG/ML IJ SOLN
INTRAMUSCULAR | Status: DC | PRN
  Administered 2016-03-21: 30 mg via INTRAVENOUS

## 2016-03-21 MED ORDER — KETOROLAC TROMETHAMINE 30 MG/ML IJ SOLN
INTRAMUSCULAR | Status: AC
Start: 2016-03-21 — End: 2016-03-21
  Filled 2016-03-21: qty 1

## 2016-03-21 MED ORDER — DEXAMETHASONE SODIUM PHOSPHATE 10 MG/ML IJ SOLN
INTRAMUSCULAR | Status: AC
  Filled 2016-03-21: qty 1

## 2016-03-21 MED ORDER — MIDAZOLAM HCL 5 MG/5ML IJ SOLN
INTRAMUSCULAR | Status: DC | PRN
  Administered 2016-03-21: 2 mg via INTRAVENOUS

## 2016-03-21 MED ORDER — DEXAMETHASONE SODIUM PHOSPHATE 4 MG/ML IJ SOLN
INTRAMUSCULAR | Status: DC | PRN
  Administered 2016-03-21: 5 mg via INTRAVENOUS

## 2016-03-21 MED ORDER — PROPOFOL 10 MG/ML IV BOLUS
INTRAVENOUS | Status: AC
  Filled 2016-03-21: qty 20

## 2016-03-21 SURGICAL SUPPLY — 64 items
BENZOIN TINCTURE PRP APPL 2/3 (GAUZE/BANDAGES/DRESSINGS) ×4 IMPLANT
BLADE HEX COATED 2.75 (ELECTRODE) ×4 IMPLANT
BLADE SURG 10 STRL SS (BLADE) IMPLANT
BLADE SURG 15 STRL LF DISP TIS (BLADE) ×2 IMPLANT
BLADE SURG 15 STRL SS (BLADE) ×2
BRIEF STRETCH FOR OB PAD LRG (UNDERPADS AND DIAPERS) ×8 IMPLANT
CANISTER SUCTION 2500CC (MISCELLANEOUS) ×4 IMPLANT
COVER BACK TABLE 60X90IN (DRAPES) ×4 IMPLANT
COVER MAYO STAND STRL (DRAPES) ×4 IMPLANT
DECANTER SPIKE VIAL GLASS SM (MISCELLANEOUS) ×4 IMPLANT
DRAPE LAPAROTOMY 100X72 PEDS (DRAPES) ×4 IMPLANT
DRAPE LG THREE QUARTER DISP (DRAPES) IMPLANT
DRAPE UNDERBUTTOCKS STRL (DRAPE) IMPLANT
DRAPE UTILITY XL STRL (DRAPES) ×4 IMPLANT
DRSG PAD ABDOMINAL 8X10 ST (GAUZE/BANDAGES/DRESSINGS) ×4 IMPLANT
ELECT BLADE 6.5 .24CM SHAFT (ELECTRODE) IMPLANT
ELECT REM PT RETURN 9FT ADLT (ELECTROSURGICAL) ×4
ELECTRODE REM PT RTRN 9FT ADLT (ELECTROSURGICAL) ×2 IMPLANT
GAUZE SPONGE 4X4 16PLY XRAY LF (GAUZE/BANDAGES/DRESSINGS) ×4 IMPLANT
GAUZE VASELINE 3X9 (GAUZE/BANDAGES/DRESSINGS) IMPLANT
GLOVE BIO SURGEON STRL SZ 6.5 (GLOVE) ×9 IMPLANT
GLOVE BIO SURGEONS STRL SZ 6.5 (GLOVE) ×3
GLOVE BIOGEL PI IND STRL 6.5 (GLOVE) ×2 IMPLANT
GLOVE BIOGEL PI IND STRL 7.5 (GLOVE) ×2 IMPLANT
GLOVE BIOGEL PI INDICATOR 6.5 (GLOVE) ×2
GLOVE BIOGEL PI INDICATOR 7.5 (GLOVE) ×2
GLOVE INDICATOR 7.0 STRL GRN (GLOVE) ×4 IMPLANT
GOWN STRL REUS W/ TWL LRG LVL3 (GOWN DISPOSABLE) ×2 IMPLANT
GOWN STRL REUS W/ TWL XL LVL3 (GOWN DISPOSABLE) ×4 IMPLANT
GOWN STRL REUS W/TWL 2XL LVL3 (GOWN DISPOSABLE) ×4 IMPLANT
GOWN STRL REUS W/TWL LRG LVL3 (GOWN DISPOSABLE) ×2
GOWN STRL REUS W/TWL XL LVL3 (GOWN DISPOSABLE) ×4
HEMOSTAT SURGICEL 2X14 (HEMOSTASIS) ×4 IMPLANT
HYDROGEN PEROXIDE 16OZ (MISCELLANEOUS) IMPLANT
KIT ROOM TURNOVER WOR (KITS) ×4 IMPLANT
LEGGING LITHOTOMY PAIR STRL (DRAPES) IMPLANT
LOOP VESSEL MAXI BLUE (MISCELLANEOUS) ×4 IMPLANT
MANIFOLD NEPTUNE II (INSTRUMENTS) IMPLANT
NDL SAFETY ECLIPSE 18X1.5 (NEEDLE) IMPLANT
NEEDLE HYPO 18GX1.5 SHARP (NEEDLE)
NEEDLE HYPO 25X1 1.5 SAFETY (NEEDLE) ×4 IMPLANT
NS IRRIG 500ML POUR BTL (IV SOLUTION) ×4 IMPLANT
PACK BASIN DAY SURGERY FS (CUSTOM PROCEDURE TRAY) ×4 IMPLANT
PAD ABD 8X10 STRL (GAUZE/BANDAGES/DRESSINGS) ×4 IMPLANT
PAD ARMBOARD 7.5X6 YLW CONV (MISCELLANEOUS) ×4 IMPLANT
PENCIL BUTTON HOLSTER BLD 10FT (ELECTRODE) ×4 IMPLANT
SPONGE GAUZE 4X4 12PLY (GAUZE/BANDAGES/DRESSINGS) IMPLANT
SPONGE GAUZE 4X4 12PLY STER LF (GAUZE/BANDAGES/DRESSINGS) ×4 IMPLANT
SPONGE SURGIFOAM ABS GEL 12-7 (HEMOSTASIS) IMPLANT
SUT CHROMIC 2 0 SH (SUTURE) IMPLANT
SUT CHROMIC 3 0 SH 27 (SUTURE) IMPLANT
SUT ETHIBOND 0 (SUTURE) IMPLANT
SUT MON AB 3-0 SH 27 (SUTURE)
SUT MON AB 3-0 SH27 (SUTURE) IMPLANT
SUT VIC AB 2-0 SH 27 (SUTURE)
SUT VIC AB 2-0 SH 27XBRD (SUTURE) IMPLANT
SUT VIC AB 4-0 P-3 18XBRD (SUTURE) IMPLANT
SUT VIC AB 4-0 P3 18 (SUTURE)
SYR CONTROL 10ML LL (SYRINGE) ×4 IMPLANT
TOWEL OR 17X24 6PK STRL BLUE (TOWEL DISPOSABLE) ×8 IMPLANT
TRAY DSU PREP LF (CUSTOM PROCEDURE TRAY) ×4 IMPLANT
TUBE CONNECTING 12'X1/4 (SUCTIONS) ×1
TUBE CONNECTING 12X1/4 (SUCTIONS) ×3 IMPLANT
YANKAUER SUCT BULB TIP NO VENT (SUCTIONS) ×4 IMPLANT

## 2016-03-21 NOTE — Op Note (Signed)
03/21/2016  8:30 AM  PATIENT:  Kyle Davidson  65 y.o. male  Patient Care Team: Stephens Shire, MD as PCP - General (Family Medicine)  PRE-OPERATIVE DIAGNOSIS:  Posterior midline fistula  POST-OPERATIVE DIAGNOSIS:  Posterior midline fistula  PROCEDURE: ANAL EXAM UNDER ANESTHESIA FISTULOTOMY   Surgeon(s): Leighton Ruff, MD  ASSISTANT: none   ANESTHESIA:   local and MAC  SPECIMEN:  No Specimen  DISPOSITION OF SPECIMEN:  PATHOLOGY  COUNTS:  YES  PLAN OF CARE: Discharge to home after PACU  PATIENT DISPOSITION:  PACU - hemodynamically stable.  INDICATION: 65 y.o. M with chronically draining anal wound   OR FINDINGS: Intersphincteric fistula  DESCRIPTION: the patient was identified in the preoperative holding area and taken to the OR where they were laid on the operating room table.  MAC anesthesia was induced without difficulty. The patient was then positioned in prone jackknife position with buttocks gently taped apart.  The patient was then prepped and draped in usual sterile fashion.  SCDs were noted to be in place prior to the initiation of anesthesia. A surgical timeout was performed indicating the correct patient, procedure, positioning and need for preoperative antibiotics.  A rectal block was performed using Marcaine with epinephrine.    I began with a digital rectal exam.  There were no masses palpated.  I then placed a Hill-Ferguson anoscope into the anal canal and evaluated this completely.  There was slightly increased anal tone.  I placed an S shaped fistula probe into the posterior midline external opening.  This exited in the mid anal canal.  It only involved internal sphincter.  I decided to perform a fistulotomy.  The edges were marsupialized using a 3-0 Chromic suture.  A dressing was applied.  The patient was awakened from anesthesia and sent to the PACU in stable condition.

## 2016-03-21 NOTE — Discharge Instructions (Addendum)

## 2016-03-21 NOTE — Anesthesia Procedure Notes (Signed)
Procedure Name: MAC Date/Time: 03/21/2016 8:00 AM Performed by: Justice Rocher Pre-anesthesia Checklist: Patient identified, Emergency Drugs available, Suction available, Patient being monitored and Timeout performed Patient Re-evaluated:Patient Re-evaluated prior to inductionOxygen Delivery Method: Simple face mask Preoxygenation: Pre-oxygenation with 100% oxygen Intubation Type: IV induction Placement Confirmation: positive ETCO2 and breath sounds checked- equal and bilateral

## 2016-03-21 NOTE — H&P (Signed)
The patient is a 65 year old male who presents with a perirectal abscess. Kyle Davidson has a history of perirectal abscess. He underwent incision and drainage in the emergency department in the past. He was followed by Dr. Brantley Stage. He has a history of recurrent abscesses in the same location in the perianal region. He was most recently seen in our office in mid June. Along with his perianal discomfort he complains of difficulty urinating when this area flares up. He reports subjective fever and chills. He reports dysuria along with burning with urination. He was started on Cipro 500 twice a day on Friday for you to urinary tract infection. 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 Colon Polyp Removal - Colonoscopy  Diagnostic Studies History  Colonoscopy within last year  Allergies Codeine Phosphate *ANALGESICS - OPIOID*  Medication History Shirlean Mylar Gwynn, RMA; 03/03/2016 3:31 PM) Ciprofloxacin HCl (500MG  Tablet, Oral) Active. Omeprazole (40MG  Capsule DR, Oral) Active. Fish Oil (1000MG  Capsule DR, 3000 Oral) Active. Aspirin (81MG  Tablet DR, Oral) Active. Medications Reconciled  Social History  No drug use Tobacco use Never smoker. Caffeine use Tea. Alcohol use Remotely quit alcohol use.  Family History  Cerebrovascular Accident Mother. Hypertension Mother. Prostate Cancer Brother, Father.  BP 138/72 mmHg  Pulse 62  Temp(Src) 97.9 F (36.6 C) (Oral)  Resp 16  Ht 6\' 3"  (1.905 m)  Wt 95.936 kg (211 lb 8 oz)  BMI 26.44 kg/m2  SpO2 97%    Physical Exam   General Mental Status-Alert. General Appearance-Consistent with stated age. Hydration-Well hydrated. Voice-Normal.  Chest and Lung Exam Chest and lung exam reveals -quiet, even and easy respiratory effort with no use of accessory muscles. Inspection Chest Wall - Normal. Back - normal. Auscultation Breath sounds - Normal.  CV: Reg Rhythm and rate  Abdomen Inspection Skin - Scar - Note: Incision: clean, dry, and intact. Palpation/Percussion Palpation and Percussion of the abdomen reveal - Soft, Non Tender, No Rebound tenderness and No Rigidity (guarding).  Rectal Note: Perianal skin reveals no cellulitis, induration or fluctuance. I cannot palpate palpable abscess or fluid collection in the soft tissue in the perianal location. However in the left lateral perianal skin about 2.5 - 3cm from the anus there is area were I can press on the area and there will be some spontaneous drainage from that sinus.     Results Procedures Internal exam: Internal Hemorroids ( non-bleeding) Other: mild grade 1 two column internal hemorrhoids. no internal abscess. when press on post anus, trace fluid drains out of L perianal skin opening about 2.5 cm from anus. 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 We did demonstrate an opening of spontaneous drainage in the perianal skin which is clinically consistent with a fistula. There is no sign of undrained abscess. Therefore do not believe it needs to be I&D in the office today. He does need exam under anesthesia with possible fistulotomy versus placement of a seton with staged repair. We discussed the risks of  surgery, which are mainly bleeding, infection, recurrence and a small chance of fecal incontinence.

## 2016-03-21 NOTE — Anesthesia Preprocedure Evaluation (Signed)
Anesthesia Evaluation  Patient identified by MRN, date of birth, ID band Patient awake    Reviewed: Allergy & Precautions, NPO status , Patient's Chart, lab work & pertinent test results  History of Anesthesia Complications (+) PONV  Airway Mallampati: I  TM Distance: >3 FB Neck ROM: Full    Dental  (+) Teeth Intact   Pulmonary    breath sounds clear to auscultation       Cardiovascular hypertension,  Rhythm:Regular Rate:Normal     Neuro/Psych  Neuromuscular disease    GI/Hepatic GERD  ,  Endo/Other    Renal/GU      Musculoskeletal  (+) Arthritis ,   Abdominal   Peds  Hematology   Anesthesia Other Findings   Reproductive/Obstetrics                             Anesthesia Physical Anesthesia Plan  ASA: II  Anesthesia Plan: General and MAC   Post-op Pain Management:    Induction: Intravenous  Airway Management Planned:   Additional Equipment:   Intra-op Plan:   Post-operative Plan: Extubation in OR  Informed Consent: I have reviewed the patients History and Physical, chart, labs and discussed the procedure including the risks, benefits and alternatives for the proposed anesthesia with the patient or authorized representative who has indicated his/her understanding and acceptance.   Dental advisory given  Plan Discussed with: CRNA  Anesthesia Plan Comments:         Anesthesia Quick Evaluation

## 2016-03-21 NOTE — Anesthesia Postprocedure Evaluation (Signed)
Anesthesia Post Note  Patient: Kyle Davidson  Procedure(s) Performed: Procedure(s) (LRB): ANAL EXAM UNDER ANESTHESIA (N/A) FISTULOTOMY (N/A)  Patient location during evaluation: PACU Anesthesia Type: MAC Level of consciousness: awake and alert Pain management: pain level controlled Vital Signs Assessment: post-procedure vital signs reviewed and stable Respiratory status: spontaneous breathing, nonlabored ventilation, respiratory function stable and patient connected to nasal cannula oxygen Cardiovascular status: stable and blood pressure returned to baseline Anesthetic complications: no    Last Vitals:  Filed Vitals:   03/21/16 0900 03/21/16 0915  BP: 144/76 141/81  Pulse: 59 62  Temp:    Resp: 13 17    Last Pain:  Filed Vitals:   03/21/16 0917  PainSc: 0-No pain                 Alysiah Suppa,JAMES TERRILL

## 2016-03-21 NOTE — Transfer of Care (Signed)
Immediate Anesthesia Transfer of Care Note  Patient: Kyle Davidson  Procedure(s) Performed: Procedure(s) (LRB): ANAL EXAM UNDER ANESTHESIA (N/A) FISTULOTOMY (N/A)  Patient Location: PACU  Anesthesia Type: MAC  Level of Consciousness: awake, sedated, patient cooperative and responds to stimulation  Airway & Oxygen Therapy: Patient Spontanous Breathing and Patient connected to face mask oxygen  Post-op Assessment: Report given to PACU RN, Post -op Vital signs reviewed and stable and Patient moving all extremities  Post vital signs: Reviewed and stable  Complications: No apparent anesthesia complications

## 2016-03-26 ENCOUNTER — Encounter (HOSPITAL_BASED_OUTPATIENT_CLINIC_OR_DEPARTMENT_OTHER): Payer: Self-pay | Admitting: General Surgery

## 2017-04-16 DIAGNOSIS — R7301 Impaired fasting glucose: Secondary | ICD-10-CM | POA: Insufficient documentation

## 2017-07-01 DIAGNOSIS — R7303 Prediabetes: Secondary | ICD-10-CM | POA: Insufficient documentation

## 2017-08-05 ENCOUNTER — Ambulatory Visit (INDEPENDENT_AMBULATORY_CARE_PROVIDER_SITE_OTHER): Payer: 59 | Admitting: Podiatry

## 2017-08-05 ENCOUNTER — Ambulatory Visit: Payer: Self-pay

## 2017-08-05 DIAGNOSIS — M79671 Pain in right foot: Secondary | ICD-10-CM

## 2017-08-05 DIAGNOSIS — G629 Polyneuropathy, unspecified: Secondary | ICD-10-CM

## 2017-08-05 DIAGNOSIS — M79672 Pain in left foot: Secondary | ICD-10-CM | POA: Diagnosis not present

## 2017-08-05 MED ORDER — GABAPENTIN 300 MG PO CAPS: 300.0000 mg | ORAL_CAPSULE | Freq: Three times a day (TID) | ORAL | 3 refills | Status: DC

## 2017-08-05 NOTE — Progress Notes (Signed)
   Subjective:    Patient ID: Kyle Davidson, male    DOB: [birth year], 66 y.o.   MRN: [id number]  HPI    Review of Systems  All other systems reviewed and are negative.      Objective:   Physical Exam        Assessment & Plan:

## 2017-08-06 NOTE — Progress Notes (Signed)
Subjective:    Patient ID: Kyle Davidson, male   DOB: 66 y.o.   MRN: [id number]   HPI patient presents with burning and tingling on the bottom of both feet and states that it's there all the time and worse at nighttime. Does not remember specific injury but does have neuropathic changes and patient does not smoke and likes to be active    Review of Systems  All other systems reviewed and are negative.       Objective:  Physical Exam  Constitutional: He appears well-developed and well-nourished.  Cardiovascular: Intact distal pulses.  Pulmonary/Chest: Effort normal.  Musculoskeletal: Normal range of motion.  Neurological: He is alert.  Skin: Skin is warm.  Nursing note and vitals reviewed.  neurovascular status intact muscle strength was adequate range of motion was within normal limits with patient having diminished sharp dull and vibratory bilateral. Patient has nondescript areas of discomfort in both feet and does have a history of significant severe back problems     Assessment:   Probability for back problems leading to neuropathy-like symptoms bilateral     Plan:    H&P x-rays reviewed and discussed treatment options. At this point were to start him on gabapentin one at night followed by one in the morning 1 mid day and hopefully we can control the symptoms he is experiencing. Patient will have this done will be seen back to recheck  X-rays were negative for signs of arthritis or other calcification issues

## 2017-10-06 ENCOUNTER — Telehealth: Payer: Self-pay | Admitting: *Deleted

## 2017-10-06 MED ORDER — GABAPENTIN 300 MG PO CAPS: 300.0000 mg | ORAL_CAPSULE | Freq: Three times a day (TID) | ORAL | 8 refills | Status: DC

## 2017-10-06 NOTE — Telephone Encounter (Signed)
Refill for Gabapentin 300mg  tid. Dr. Paulla Dolly states refill +8additional, pt needs an appt prior to future refills.

## 2018-03-02 ENCOUNTER — Other Ambulatory Visit: Payer: Self-pay | Admitting: Urology

## 2018-03-05 ENCOUNTER — Encounter (HOSPITAL_COMMUNITY): Payer: Self-pay | Admitting: General Practice

## 2018-03-10 NOTE — H&P (Signed)
Office Visit Report     03/01/2018   --------------------------------------------------------------------------------   Kyle Davidson  MRN: [id number]  PRIMARY CARE:  Eino Farber, MD  DOB: 1951-06-07, 67 year old Male  REFERRING:  Ammie Dalton, NP  SSN: [id number]  PROVIDER:  Festus Aloe, M.D.    LOCATION:  [facility] [phone]   --------------------------------------------------------------------------------   CC: I have blood in my urine.  HPI: Kyle Davidson is a 67 year-old male established patient who is here for blood in the urine.  He did not see the blood in his urine. He first noticed the blood 01/11/2018.   He does not have a burning sensation when he urinates. He is not currently having trouble urinating.   Patient has a history of BPH and kidney stones. He had microscopic hematuria on recent office visit. He follows up today for ultrasound and cystoscopy.   He had an ultrasound today which was benign. I reviewed the images and compared back to his KUB April 2019 and CT March 2017.     CC: I have a history of kidney stones.  HPI: The patient was last seen 01/07/2017. The patient's stone was on his right side. He did not pass a stone since the last office visit.   The patient has not had any flank pain since they were last seen. The patient denies any progressive voiding symptoms. He has no seen blood in his urine since the last visit. He is not currently having flank pain, back pain, groin pain, nausea, vomiting, fever or chills.   Prior surgical history treatment of a perineal abscess and bilateral inguinal hernia/umbilical hernia repair. He also has a known right renal stone. Prior urological history of prostatitis treated by his pcp mostly. He passed and right stone and underwent right ESWL in 2017.    He has occasional right flank pain. He underwent ultrasound today which showed continued right lower pole stone. No hydronephrosis.  Right lower pole stone might have increased slightly in size. It's about 11 mm on the KUB.     ALLERGIES: Codeine Derivatives    MEDICATIONS: Omeprazole 40 mg capsule,delayed release  Aspirin Ec 81 mg tablet, delayed release Oral  Fish Oil CAPS Oral  Tylenol PRN     GU PSH: ESWL - 2017      PSH Notes: Renal Lithotripsy, Complete Colonoscopy, Back Surgery, Shoulder Surgery, Hernia Repair   NON-GU PSH: Cataract surgery, Right Diagnostic Colonoscopy - 2017 Hernia Repair - 2015    GU PMH: BPH w/LUTS - 01/11/2018, Benign localized prostatic hyperplasia with lower urinary tract symptoms (LUTS), - 2015 Microscopic hematuria, He has a paternal hx of bladder and prostate cancer. He has known hx of nephrolithiasis with stable appearing stone burden on the right. No symptoms today to suggest infection, obstructing ureteral calculi. I recommend today Further evaluation given his history. He's had CT scans in 2017/18 that did not indicate renal masses or lesions. I'll defer a CT at this time. He will need follow-up with His urologist for cystoscopy. I'll order a renal ultrasound to be performed at that visit to determine need for further upper tract imaging. - 01/11/2018 Renal calculus - 01/11/2018, Nephrolithiasis, - 3300 Renal colic, Renal colic - 7622 Ureteral calculus, Calculus of right ureter - 2017 Encounter for Prostate Cancer screening, Prostate cancer screening - 2015 Epididymal cyst, Epididymal cyst - 2015 Nocturia, Nocturia - 2015 Urinary Frequency, Increased urinary frequency - 2015 Weak Urinary Stream, Weak urinary stream - 2015  Epididymitis, Epididymitis, left - 2014/01/05 Other difficulties with micturition, Urine stream spraying - 01-05-2014 Straining on Urination, Straining on urination - Jan 05, 2014 Testicular pain, unspecified, Testicular pain - 2014/01/05 Urinary Retention, Unspec, Incomplete bladder emptying - 01/05/14    NON-GU PMH: Encounter for general adult medical examination without abnormal  findings, Encounter for preventive health examination - 2016/01/06 Personal history of other diseases of the circulatory system, History of cardiac murmur - Jan 05, 2014 Personal history of other diseases of the digestive system, History of gastroesophageal reflux (GERD) - 2014/01/05 Personal history of other diseases of the musculoskeletal system and connective tissue, History of arthritis - 01-05-14 Personal history of other mental and behavioral disorders, History of depression - 05-Jan-2014    FAMILY HISTORY: Death of family member - Runs In Family Hematuria - Runs In Family Kidney Cancer - Runs In Family Kidney Stones - Runs In Family Prostate Cancer - Runs In Family   SOCIAL HISTORY: Marital Status: Married Preferred Language: English; Ethnicity: Not Hispanic Or Latino; Race: White Current Smoking Status: Patient has never smoked.   Tobacco Use Assessment Completed: Used Tobacco in last 30 days? Has never drank.  Does not drink caffeine.     Notes: Never a smoker, Number of children, Caffeine use, Alcohol use, Retired, Married   REVIEW OF SYSTEMS:    GU Review Male:   Patient denies frequent urination, hard to postpone urination, burning/ pain with urination, get up at night to urinate, leakage of urine, stream starts and stops, trouble starting your stream, have to strain to urinate , erection problems, and penile pain.  Gastrointestinal (Upper):   Patient denies nausea, vomiting, and indigestion/ heartburn.  Gastrointestinal (Lower):   Patient denies constipation and diarrhea.  Constitutional:   Patient denies fever, night sweats, weight loss, and fatigue.  Skin:   Patient denies skin rash/ lesion and itching.  Eyes:   Patient denies blurred vision and double vision.  Ears/ Nose/ Throat:   Patient denies sore throat and sinus problems.  Hematologic/Lymphatic:   Patient denies swollen glands and easy bruising.  Cardiovascular:   Patient denies leg swelling and chest pains.  Respiratory:   Patient denies  cough and shortness of breath.  Endocrine:   Patient denies excessive thirst.  Musculoskeletal:   Patient denies back pain and joint pain.  Neurological:   Patient denies headaches and dizziness.  Psychologic:   Patient denies depression and anxiety.   VITAL SIGNS:      03/01/2018 02:02 PM  BP 148/83 mmHg  Pulse 76 /min  Temperature 98.1 F / 36.7 C   MULTI-SYSTEM PHYSICAL EXAMINATION:    Constitutional: Well-nourished. No physical deformities. Normally developed. Good grooming.  Neck: Neck symmetrical, not swollen. Normal tracheal position.  Respiratory: No labored breathing, no use of accessory muscles. Normal breath sounds.  Cardiovascular: Regular rate and rhythm. Normal temperature, normal extremity pulses, no swelling, no varicosities.   Skin: No paleness, no jaundice, no cyanosis. No lesion, no ulcer, no rash.  Neurologic / Psychiatric: Oriented to time, oriented to place, oriented to person. No depression, no anxiety, no agitation.  Gastrointestinal: No mass, no tenderness, no rigidity, non obese abdomen.     PAST DATA REVIEWED:  Source Of History:  Patient  X-Ray Review: KUB: Reviewed Films. January 06, 2016, 2017/01/05  C.T. Abdomen/Pelvis: Reviewed Films. January 06, 2016    01/11/18 02/08/14  PSA  Total PSA 0.47 ng/mL 0.38     PROCEDURES:         Flexible Cystoscopy - 52000  Risks, benefits, and some of  the potential complications of the procedure were discussed with the patient. All questions were answered. Informed consent was obtained. Antibiotic prophylaxis was given -- Cephalexin. Sterile technique and intraurethral analgesia were used.  Meatus:  Normal size. Normal location. Normal condition.  Urethra:  No strictures.  External Sphincter:  Normal.  Verumontanum:  Normal.  Prostate:  Non-obstructing. No hyperplasia.  Bladder Neck:  Non-obstructing.  Ureteral Orifices:  Normal location. Normal size. Normal shape. Effluxed clear urine.  Bladder:  No trabeculation. No tumors. Normal mucosa.  No stones.      The lower urinary tract was carefully examined. The procedure was well-tolerated and without complications. Antibiotic instructions were given. Instructions were given to call the office immediately for bloody urine, difficulty urinating, painful urination, fever, chills, nausea, vomiting or other illness. The patient stated that he understood these instructions and would comply with them.         Renal Ultrasound - 22482  Right Kidney: Length: 11.8 cm Depth: 5.3 cm Cortical Width: 1.0 cm Width: 6.0 cm  Left Kidney: Length:12.3 cm Depth: 5.6 cm Cortical Width:1.5 cm Width: 6.2 cm  Left Kidney/Ureter:  Appears WNL.   Right Kidney/Ureter:  Multiple echogenic foci with and without shadowing with the largest measuring 0.9 cm in the lower pole ( calcifications vs vessels).   Bladder:  PVR = 116.8 ml.       This exam is limited due to bowel gas.          Urinalysis Dipstick Dipstick Cont'd  Color: Yellow Bilirubin: Neg  Appearance: Clear Ketones: Neg  Specific Gravity: 1.025 Blood: Neg  pH: 5.5 Protein: Neg  Glucose: Neg Urobilinogen: 0.2    Nitrites: Neg    Leukocyte Esterase: Neg    ASSESSMENT:      ICD-10 Details  1 GU:   Microscopic hematuria - R31.1   2   Renal calculus - N20.0    PLAN:           Schedule Return Visit/Planned Activity: Next Available Appointment - Schedule Surgery          Document Letter(s):  Created for Patient: Clinical Summary         Notes:   Microscopic hematuria-benign evaluation   BPH-he complains of a weak stream and he try tamsulosin before. We discussed alternatives such as Urolift and he'll consider. We discussed the nature r/b/a of Urolift.   kidney stone - Right Lower Pole stone slowly enlarging. Discussed nature r/b of surveillance, ureteroscopy and ESWL. Discussed expected success, complications and need for secondary procedures.      * Signed by Festus Aloe, M.D. on 03/01/18 at 9:16 PM (EDT)*

## 2018-03-11 ENCOUNTER — Encounter (HOSPITAL_COMMUNITY)
Admission: RE | Disposition: A | Discharge status: Home / Self Care | Payer: Self-pay | Source: Ambulatory Visit | Attending: Urology

## 2018-03-11 ENCOUNTER — Ambulatory Visit (HOSPITAL_COMMUNITY): Payer: 59

## 2018-03-11 ENCOUNTER — Ambulatory Visit (HOSPITAL_COMMUNITY)
Admission: RE | Admit: 2018-03-11 | Discharge: 2018-03-11 | Disposition: A | Discharge status: Home / Self Care | Payer: 59 | Source: Ambulatory Visit | Attending: Urology | Admitting: Urology

## 2018-03-11 ENCOUNTER — Encounter (HOSPITAL_COMMUNITY): Payer: Self-pay

## 2018-03-11 DIAGNOSIS — K219 Gastro-esophageal reflux disease without esophagitis: Secondary | ICD-10-CM | POA: Insufficient documentation

## 2018-03-11 DIAGNOSIS — N200 Calculus of kidney: Secondary | ICD-10-CM | POA: Insufficient documentation

## 2018-03-11 DIAGNOSIS — Z79899 Other long term (current) drug therapy: Secondary | ICD-10-CM | POA: Diagnosis not present

## 2018-03-11 DIAGNOSIS — N401 Enlarged prostate with lower urinary tract symptoms: Secondary | ICD-10-CM | POA: Insufficient documentation

## 2018-03-11 DIAGNOSIS — Z87442 Personal history of urinary calculi: Secondary | ICD-10-CM | POA: Diagnosis not present

## 2018-03-11 DIAGNOSIS — Z885 Allergy status to narcotic agent status: Secondary | ICD-10-CM | POA: Insufficient documentation

## 2018-03-11 DIAGNOSIS — N201 Calculus of ureter: Secondary | ICD-10-CM

## 2018-03-11 DIAGNOSIS — R011 Cardiac murmur, unspecified: Secondary | ICD-10-CM | POA: Insufficient documentation

## 2018-03-11 DIAGNOSIS — Z7982 Long term (current) use of aspirin: Secondary | ICD-10-CM | POA: Diagnosis not present

## 2018-03-11 DIAGNOSIS — R3912 Poor urinary stream: Secondary | ICD-10-CM | POA: Insufficient documentation

## 2018-03-11 DIAGNOSIS — M1990 Unspecified osteoarthritis, unspecified site: Secondary | ICD-10-CM | POA: Diagnosis not present

## 2018-03-11 DIAGNOSIS — Z7901 Long term (current) use of anticoagulants: Secondary | ICD-10-CM | POA: Insufficient documentation

## 2018-03-11 HISTORY — PX: EXTRACORPOREAL SHOCK WAVE LITHOTRIPSY: SHX1557

## 2018-03-11 SURGERY — LITHOTRIPSY, ESWL
Anesthesia: LOCAL | Laterality: Right

## 2018-03-11 MED ORDER — CIPROFLOXACIN HCL 500 MG PO TABS
500.0000 mg | ORAL_TABLET | ORAL | Status: AC
  Administered 2018-03-11: 500 mg via ORAL
  Filled 2018-03-11: qty 1

## 2018-03-11 MED ORDER — DIAZEPAM 5 MG PO TABS
10.0000 mg | ORAL_TABLET | ORAL | Status: AC
  Administered 2018-03-11: 10 mg via ORAL
  Filled 2018-03-11: qty 2

## 2018-03-11 MED ORDER — OXYCODONE-ACETAMINOPHEN 5-325 MG PO TABS
1.0000 | ORAL_TABLET | ORAL | Status: AC
  Administered 2018-03-11: 1 via ORAL

## 2018-03-11 MED ORDER — OXYCODONE-ACETAMINOPHEN 5-325 MG PO TABS
ORAL_TABLET | ORAL | Status: AC
  Filled 2018-03-11: qty 1

## 2018-03-11 MED ORDER — DIPHENHYDRAMINE HCL 25 MG PO CAPS
25.0000 mg | ORAL_CAPSULE | ORAL | Status: AC
  Administered 2018-03-11: 25 mg via ORAL
  Filled 2018-03-11: qty 1

## 2018-03-11 MED ORDER — SODIUM CHLORIDE 0.9 % IV SOLN
INTRAVENOUS | Status: DC
  Administered 2018-03-11: 08:00:00 via INTRAVENOUS

## 2018-03-11 NOTE — Op Note (Signed)
Please see scanned chart for ESWL operative note.

## 2018-03-11 NOTE — Discharge Instructions (Addendum)
1. You should strain your urine and collect all fragments and bring them to your follow up appointment.  2. You should take your pain medication as needed.  Please call if your pain is severe to the point that it is not controlled with your pain medication. 3. You should call if you develop fever > 101 or persistent nausea or vomiting. 4. Your doctor may prescribe tamsulosin to take to help facilitate stone passage.  Follow [facility] instructions

## 2018-03-12 ENCOUNTER — Encounter (HOSPITAL_COMMUNITY): Payer: Self-pay | Admitting: Urology

## 2018-03-14 ENCOUNTER — Emergency Department (HOSPITAL_COMMUNITY)
Admission: EM | Admit: 2018-03-14 | Discharge: 2018-03-14 | Disposition: A | Discharge status: Home / Self Care | Payer: 59 | Attending: Emergency Medicine | Admitting: Emergency Medicine

## 2018-03-14 ENCOUNTER — Emergency Department (HOSPITAL_COMMUNITY): Payer: 59

## 2018-03-14 ENCOUNTER — Encounter (HOSPITAL_COMMUNITY): Payer: Self-pay | Admitting: Emergency Medicine

## 2018-03-14 DIAGNOSIS — Z79899 Other long term (current) drug therapy: Secondary | ICD-10-CM | POA: Insufficient documentation

## 2018-03-14 DIAGNOSIS — N200 Calculus of kidney: Secondary | ICD-10-CM | POA: Diagnosis not present

## 2018-03-14 DIAGNOSIS — I10 Essential (primary) hypertension: Secondary | ICD-10-CM | POA: Insufficient documentation

## 2018-03-14 DIAGNOSIS — Z85828 Personal history of other malignant neoplasm of skin: Secondary | ICD-10-CM | POA: Insufficient documentation

## 2018-03-14 DIAGNOSIS — R109 Unspecified abdominal pain: Secondary | ICD-10-CM | POA: Diagnosis present

## 2018-03-14 LAB — BASIC METABOLIC PANEL
ANION GAP: 8 (ref 5–15)
BUN: 12 mg/dL (ref 6–20)
CALCIUM: 9.1 mg/dL (ref 8.9–10.3)
CO2: 27 mmol/L (ref 22–32)
Chloride: 108 mmol/L (ref 101–111)
Creatinine, Ser: 1.02 mg/dL (ref 0.61–1.24)
GFR calc Af Amer: >60 mL/min (ref >60)
GFR calc non Af Amer: >60 mL/min (ref >60)
Glucose, Bld: 94 mg/dL (ref 65–99)
POTASSIUM: 5.8 mmol/L — AB (ref 3.5–5.1)
Sodium: 143 mmol/L (ref 135–145)

## 2018-03-14 LAB — URINALYSIS, ROUTINE W REFLEX MICROSCOPIC
Bilirubin Urine: NEGATIVE
Glucose, UA: NEGATIVE mg/dL
KETONES UR: NEGATIVE mg/dL
LEUKOCYTES UA: NEGATIVE
NITRITE: NEGATIVE
PH: 6.5 (ref 5.0–8.0)
PROTEIN: NEGATIVE mg/dL
Specific Gravity, Urine: 1.02 (ref 1.005–1.030)

## 2018-03-14 LAB — POTASSIUM: Potassium: 3.6 mmol/L (ref 3.5–5.1)

## 2018-03-14 LAB — URINALYSIS, MICROSCOPIC (REFLEX)
RBC / HPF: >50 RBC/hpf (ref 0–5)
Squamous Epithelial / LPF: NONE SEEN (ref 0–5)

## 2018-03-14 MED ORDER — ONDANSETRON HCL 4 MG/2ML IJ SOLN
4.0000 mg | Freq: Once | INTRAMUSCULAR | Status: AC
  Administered 2018-03-14: 4 mg via INTRAVENOUS
  Filled 2018-03-14: qty 2

## 2018-03-14 MED ORDER — MORPHINE SULFATE (PF) 4 MG/ML IV SOLN
4.0000 mg | Freq: Once | INTRAVENOUS | Status: AC
  Administered 2018-03-14: 4 mg via INTRAVENOUS
  Filled 2018-03-14: qty 1

## 2018-03-14 MED ORDER — SODIUM CHLORIDE 0.9 % IV SOLN
INTRAVENOUS | Status: DC
  Administered 2018-03-14: 18:00:00 via INTRAVENOUS

## 2018-03-14 NOTE — ED Provider Notes (Signed)
[facility] DEPT Provider Note   CSN: [id number] Arrival date & time: 03/14/18  1618     History   Chief Complaint Chief Complaint  Patient presents with  . Flank Pain    HPI Kyle Davidson is a 67 y.o. male.  67 year old male with history of recent lithotripsy for a reported 40 mm right-sided kidney stone resents with worsening right-sided flank pain.  Patient continues with hematuria.  Denies any fever or chills.  No emesis.  Pain is sharp and characterizes 9 of 10.  It radiates to his right testicle but he denies any pain or swelling in his groin area.  Called his urologist was told to come here.     Past Medical History:  Diagnosis Date  . Anal fistula   . Arthritis   . BPH (benign prostatic hyperplasia)   . GERD (gastroesophageal reflux disease)   . Hiatal hernia   . History of basal cell carcinoma excision    nose and face  . History of esophageal dilatation    10-25-2015  . History of kidney stones    past hx. "was told presntly has one in right kidny-not bothersome.  Marland Kitchen History of urinary retention   . History of vertebral fracture    neck x2 area's per pt  . Lower urinary tract symptoms (LUTS)   . PONV (postoperative nausea and vomiting)    occ  . Rash    foot  . Renal calculus, right   . Seasonal allergies   . UTI (urinary tract infection)   . Wears glasses     Patient Active Problem List   Diagnosis Date Noted  . Appendicolith 04/03/2014  . Dyspnea on exertion 02/01/2014  . Chest pain 02/01/2014  . Essential hypertension 02/01/2014  . Hyperlipidemia 02/01/2014  . Post-op pain 08/22/2013  . Bilateral inguinal hernia 07/11/2013  . Umbilical hernia 68/08/7516  . Unspecified constipation 07/11/2013  . Angioedema of lips 12/26/2011  . GERD (gastroesophageal reflux disease) 12/26/2011  . Dysphagia 12/26/2011  . Sinusitis 12/26/2011  . Elevated BP 12/26/2011  . Shoulder pain 12/26/2011    Past Surgical History:    Procedure Laterality Date  . CARDIOVASCULAR STRESS TEST  02-20-2014   Low risk lexiscan nuclear study w/ no reversible ischemia/  normal LV function and wall motion, ef 67%  . ESOPHAGOGASTRODUODENOSCOPY (EGD) WITH PROPOFOL N/A 11/19/2015   Procedure: ESOPHAGOGASTRODUODENOSCOPY (EGD) WITH PROPOFOL;  Surgeon: Laurence Spates, MD;  Location: [facility];  Service: Endoscopy;  Laterality: N/A;  . EXTRACORPOREAL SHOCK WAVE LITHOTRIPSY Right 12-10-2015  . EXTRACORPOREAL SHOCK WAVE LITHOTRIPSY Right 03/11/2018   Procedure: RIGHT EXTRACORPOREAL SHOCK WAVE LITHOTRIPSY (ESWL);  Surgeon: Raynelle Bring, MD;  Location: [facility];  Service: Urology;  Laterality: Right;  . FISTULOTOMY N/A 03/21/2016   Procedure: FISTULOTOMY;  Surgeon: Leighton Ruff, MD;  Location: [facility];  Service: General;  Laterality: N/A;  . HAND SURGERY  x2  2008   repair left thumb injury  . INGUINAL HERNIA REPAIR Bilateral 08/16/2013   Procedure: LAPAROSCOPIC BILATERAL INGUINAL HERNIA WITH UMBILICAL HERNIA;  Surgeon: Joyice Faster. Cornett, MD;  Location: [facility];  Service: General;  Laterality: Bilateral;  . INSERTION OF MESH Bilateral 08/16/2013   Procedure: INSERTION OF MESH;  Surgeon: Joyice Faster. Cornett, MD;  Location: [place];  Service: General;  Laterality: Bilateral;  . LUMBAR FUSION  x2  last one 1997 (approx)  . NASAL SEPTUM SURGERY  1980's  . SAVORY DILATION N/A 11/19/2015   Procedure: SAVORY  DILATION;  Surgeon: Laurence Spates, MD;  Location: [facility];  Service: Endoscopy;  Laterality: N/A;  . SHOULDER ARTHROSCOPY W/ ROTATOR CUFF REPAIR Bilateral right 2013/  left 1990's   and Debridement labral tear  . TRANSTHORACIC ECHOCARDIOGRAM  02-20-2014   normal LV function and wall motion , ef 50-55%/  mild dilated ascending aorta/  trivial MR and TR/  mild LAE        Home Medications    Prior to Admission medications   Medication Sig Start Date End Date Taking? Authorizing Provider  acetaminophen (TYLENOL) 500 MG  tablet Take 1,000 mg by mouth daily as needed for mild pain, moderate pain or headache.     [provider]  gabapentin (NEURONTIN) 300 MG capsule Take 1 capsule (300 mg total) 3 (three) times daily by mouth. 08/05/17   Regal, Tamala Fothergill, DPM  gabapentin (NEURONTIN) 300 MG capsule Take 1 capsule (300 mg total) by mouth 3 (three) times daily. 10/06/17   Wallene Huh, DPM  Omega-3 Fatty Acids (FISH OIL) 1000 MG CAPS Take 2 capsules by mouth 2 (two) times daily. 2 caps in am and 1 cap in pm    [provider]  omeprazole (PRILOSEC) 40 MG capsule Take 40 mg by mouth 2 (two) times daily. 10/17/15   [provider]    Family History Family History  Problem Relation Age of Onset  . Bladder Cancer Father   . Prostate cancer Father     Social History Social History   Tobacco Use  . Smoking status: Never Smoker  . Smokeless tobacco: Never Used  Substance Use Topics  . Alcohol use: No  . Drug use: No     Allergies   Codeine   Review of Systems Review of Systems  All other systems reviewed and are negative.    Physical Exam Updated Vital Signs BP (!) 153/86   Pulse 72   Temp 98.6 F (37 C) (Oral)   SpO2 98%   Physical Exam  Constitutional: He is oriented to person, place, and time. He appears well-developed and well-nourished.  Non-toxic appearance. No distress.  HENT:  Head: Normocephalic and atraumatic.  Eyes: Pupils are equal, round, and reactive to light. Conjunctivae, EOM and lids are normal.  Neck: Normal range of motion. Neck supple. No tracheal deviation present. No thyroid mass present.  Cardiovascular: Normal rate, regular rhythm and normal heart sounds. Exam reveals no gallop.  No murmur heard. Pulmonary/Chest: Effort normal and breath sounds normal. No stridor. No respiratory distress. He has no decreased breath sounds. He has no wheezes. He has no rhonchi. He has no rales.  Abdominal: Soft. Normal appearance and bowel sounds are normal.  He exhibits no distension. There is no tenderness. There is CVA tenderness. There is no rebound.  Musculoskeletal: Normal range of motion. He exhibits no edema or tenderness.  Neurological: He is alert and oriented to person, place, and time. He has normal strength. No cranial nerve deficit or sensory deficit. GCS eye subscore is 4. GCS verbal subscore is 5. GCS motor subscore is 6.  Skin: Skin is warm and dry. No abrasion and no rash noted.  Psychiatric: He has a normal mood and affect. His speech is normal and behavior is normal.  Nursing note and vitals reviewed.    ED Treatments / Results  Labs (all labs ordered are listed, but only abnormal results are displayed) Labs Reviewed  URINE CULTURE  BASIC METABOLIC PANEL  URINALYSIS, ROUTINE W REFLEX MICROSCOPIC  EKG None  Radiology No results found.  Procedures Procedures (including critical care time)  Medications Ordered in ED Medications  0.9 %  sodium chloride infusion (has no administration in time range)  morphine 4 MG/ML injection 4 mg (has no administration in time range)  ondansetron (ZOFRAN) injection 4 mg (has no administration in time range)     Initial Impression / Assessment and Plan / ED Course  I have reviewed the triage vital signs and the nursing notes.  Pertinent labs & imaging results that were available during my care of the patient were reviewed by me and considered in my medical decision making (see chart for details).     Repeat potassium within normal limits.  Was medicated for pain and feels better and is stable for discharge  Final Clinical Impressions(s) / ED Diagnoses   Final diagnoses:  None    ED Discharge Orders    None       Lacretia Leigh, MD 03/14/18 2222

## 2018-03-14 NOTE — Discharge Instructions (Addendum)
Follow-up with your urologist this week. °

## 2018-03-14 NOTE — ED Triage Notes (Signed)
Patient reports lithotripsy on Thursday. Reports hematuria Friday and Saturday. C/o right flank pain today. Denies passing any stones.

## 2018-03-16 LAB — URINE CULTURE: Culture: NO GROWTH

## 2018-04-07 ENCOUNTER — Ambulatory Visit (HOSPITAL_COMMUNITY)
Admission: RE | Admit: 2018-04-07 | Discharge: 2018-04-07 | Disposition: A | Discharge status: Home / Self Care | Payer: 59 | Source: Ambulatory Visit | Attending: Urology | Admitting: Urology

## 2018-04-07 ENCOUNTER — Encounter (HOSPITAL_COMMUNITY)
Admission: RE | Disposition: A | Discharge status: Home / Self Care | Payer: Self-pay | Source: Ambulatory Visit | Attending: Urology

## 2018-04-07 ENCOUNTER — Ambulatory Visit (HOSPITAL_COMMUNITY): Payer: 59

## 2018-04-07 ENCOUNTER — Inpatient Hospital Stay (HOSPITAL_COMMUNITY): Payer: 59 | Admitting: Anesthesiology

## 2018-04-07 ENCOUNTER — Encounter (HOSPITAL_COMMUNITY): Payer: Self-pay | Admitting: *Deleted

## 2018-04-07 ENCOUNTER — Other Ambulatory Visit: Payer: Self-pay | Admitting: Urology

## 2018-04-07 ENCOUNTER — Other Ambulatory Visit: Payer: Self-pay

## 2018-04-07 DIAGNOSIS — Z7982 Long term (current) use of aspirin: Secondary | ICD-10-CM | POA: Diagnosis not present

## 2018-04-07 DIAGNOSIS — N201 Calculus of ureter: Secondary | ICD-10-CM | POA: Diagnosis present

## 2018-04-07 DIAGNOSIS — I10 Essential (primary) hypertension: Secondary | ICD-10-CM | POA: Insufficient documentation

## 2018-04-07 DIAGNOSIS — K219 Gastro-esophageal reflux disease without esophagitis: Secondary | ICD-10-CM | POA: Diagnosis not present

## 2018-04-07 DIAGNOSIS — Z79899 Other long term (current) drug therapy: Secondary | ICD-10-CM | POA: Insufficient documentation

## 2018-04-07 HISTORY — PX: CYSTOSCOPY WITH RETROGRADE PYELOGRAM, URETEROSCOPY AND STENT PLACEMENT: SHX5789

## 2018-04-07 SURGERY — CYSTOURETEROSCOPY, WITH RETROGRADE PYELOGRAM AND STENT INSERTION
Anesthesia: General | Laterality: Right

## 2018-04-07 MED ORDER — PROPOFOL 10 MG/ML IV BOLUS
INTRAVENOUS | Status: AC
  Filled 2018-04-07: qty 20

## 2018-04-07 MED ORDER — IOHEXOL 300 MG/ML  SOLN
INTRAMUSCULAR | Status: DC | PRN
  Administered 2018-04-07: 7 mL via URETHRAL

## 2018-04-07 MED ORDER — CEFAZOLIN SODIUM-DEXTROSE 2-4 GM/100ML-% IV SOLN
2.0000 g | Freq: Once | INTRAVENOUS | Status: DC
  Filled 2018-04-07: qty 100

## 2018-04-07 MED ORDER — LIDOCAINE 2% (20 MG/ML) 5 ML SYRINGE
INTRAMUSCULAR | Status: AC
  Filled 2018-04-07: qty 5

## 2018-04-07 MED ORDER — MEPERIDINE HCL 50 MG/ML IJ SOLN: 6.2500 mg | INTRAMUSCULAR | Status: DC | PRN

## 2018-04-07 MED ORDER — ONDANSETRON HCL 4 MG/2ML IJ SOLN
INTRAMUSCULAR | Status: AC
  Filled 2018-04-07: qty 2

## 2018-04-07 MED ORDER — FENTANYL CITRATE (PF) 100 MCG/2ML IJ SOLN
50.0000 ug | INTRAMUSCULAR | Status: DC | PRN
  Administered 2018-04-07: 50 ug via INTRAVENOUS
  Filled 2018-04-07: qty 2

## 2018-04-07 MED ORDER — MIDAZOLAM HCL 2 MG/2ML IJ SOLN
INTRAMUSCULAR | Status: AC
  Filled 2018-04-07: qty 2

## 2018-04-07 MED ORDER — LACTATED RINGERS IV SOLN
INTRAVENOUS | Status: DC
  Administered 2018-04-07: 14:00:00 via INTRAVENOUS

## 2018-04-07 MED ORDER — DEXAMETHASONE SODIUM PHOSPHATE 10 MG/ML IJ SOLN
INTRAMUSCULAR | Status: AC
  Filled 2018-04-07: qty 1

## 2018-04-07 MED ORDER — PROMETHAZINE HCL 25 MG/ML IJ SOLN: 6.2500 mg | INTRAMUSCULAR | Status: DC | PRN

## 2018-04-07 MED ORDER — SODIUM CHLORIDE 0.9 % IR SOLN
Status: DC | PRN
  Administered 2018-04-07: 3000 mL via INTRAVESICAL

## 2018-04-07 MED ORDER — OXYCODONE HCL 5 MG PO TABS: 5.0000 mg | ORAL_TABLET | ORAL | Status: DC | PRN

## 2018-04-07 MED ORDER — HYDROMORPHONE HCL 1 MG/ML IJ SOLN: 0.2500 mg | INTRAMUSCULAR | Status: DC | PRN

## 2018-04-07 MED ORDER — FENTANYL CITRATE (PF) 100 MCG/2ML IJ SOLN
INTRAMUSCULAR | Status: AC
  Filled 2018-04-07: qty 2

## 2018-04-07 SURGICAL SUPPLY — 22 items
BAG URO CATCHER STRL LF (MISCELLANEOUS) ×3 IMPLANT
BASKET ZERO TIP NITINOL 2.4FR (BASKET) IMPLANT
CATH INTERMIT  6FR 70CM (CATHETERS) ×3 IMPLANT
CLOTH BEACON ORANGE TIMEOUT ST (SAFETY) IMPLANT
COVER FOOTSWITCH UNIV (MISCELLANEOUS) IMPLANT
COVER SURGICAL LIGHT HANDLE (MISCELLANEOUS) IMPLANT
EXTRACTOR STONE 1.7FRX115CM (UROLOGICAL SUPPLIES) ×3 IMPLANT
FIBER LASER FLEXIVA 365 (UROLOGICAL SUPPLIES) IMPLANT
FIBER LASER TRAC TIP (UROLOGICAL SUPPLIES) IMPLANT
GLOVE BIOGEL M STRL SZ7.5 (GLOVE) ×3 IMPLANT
GOWN STRL REUS W/TWL LRG LVL3 (GOWN DISPOSABLE) ×3 IMPLANT
GUIDEWIRE ANG ZIPWIRE 038X150 (WIRE) IMPLANT
GUIDEWIRE STR DUAL SENSOR (WIRE) ×3 IMPLANT
IV NS 1000ML (IV SOLUTION)
IV NS 1000ML BAXH (IV SOLUTION) IMPLANT
MANIFOLD NEPTUNE II (INSTRUMENTS) ×3 IMPLANT
NS IRRIG 1000ML POUR BTL (IV SOLUTION) ×3 IMPLANT
PACK CYSTO (CUSTOM PROCEDURE TRAY) ×3 IMPLANT
SHEATH URETERAL 12FRX35CM (MISCELLANEOUS) IMPLANT
TUBING CONNECTING 10 (TUBING) ×2 IMPLANT
TUBING CONNECTING 10' (TUBING) ×1
TUBING UROLOGY SET (TUBING) ×3 IMPLANT

## 2018-04-07 NOTE — Transfer of Care (Signed)
Immediate Anesthesia Transfer of Care Note  Patient: Kyle Davidson  Procedure(s) Performed: CYSTOSCOPY WITH RETROGRADE PYELOGRAM, URETEROSCOPY AND STONE EXTRACTION (Right )  Patient Location: PACU  Anesthesia Type:General  Level of Consciousness: sedated  Airway & Oxygen Therapy: Patient Spontanous Breathing and Patient connected to face mask oxygen  Post-op Assessment: Report given to RN and Post -op Vital signs reviewed and stable  Post vital signs: Reviewed and stable  Last Vitals:  Vitals Value Taken Time  BP    Temp    Pulse 60 04/07/2018  4:36 PM  Resp 15 04/07/2018  4:36 PM  SpO2 100 % 04/07/2018  4:36 PM  Vitals shown include unvalidated device data.  Last Pain:  Vitals:   04/07/18 1531  TempSrc:   PainSc: 5       Patients Stated Pain Goal: 3 (49/20/10 0712)  Complications: No apparent anesthesia complications

## 2018-04-07 NOTE — Discharge Instructions (Addendum)
1. You may see some blood in the urine and may have some burning with urination for 48-72 hours. You also may notice that you have to urinate more frequently or urgently after your procedure which is normal.  °2. You should call should you develop an inability urinate, fever > 101, persistent nausea and vomiting that prevents you from eating or drinking to stay hydrated.  °

## 2018-04-07 NOTE — Anesthesia Postprocedure Evaluation (Signed)
Anesthesia Post Note  Patient: Kyle Davidson  Procedure(s) Performed: CYSTOSCOPY WITH RETROGRADE PYELOGRAM, URETEROSCOPY AND STONE EXTRACTION (Right )     Patient location during evaluation: PACU Anesthesia Type: General Level of consciousness: sedated and patient cooperative Pain management: pain level controlled Vital Signs Assessment: post-procedure vital signs reviewed and stable Respiratory status: spontaneous breathing Cardiovascular status: stable Anesthetic complications: no    Last Vitals:  Vitals:   04/07/18 1715 04/07/18 1745  BP: 125/65 (!) 142/63  Pulse: (!) 58 62  Resp: 12 14  Temp: 36.9 C   SpO2: 96% 96%    Last Pain:  Vitals:   04/07/18 1745  TempSrc:   PainSc: 0-No pain                 Nolon Nations

## 2018-04-07 NOTE — Anesthesia Procedure Notes (Signed)
Procedure Name: LMA Insertion Date/Time: 04/07/2018 3:59 PM Performed by: Lind Covert, CRNA Pre-anesthesia Checklist: Patient identified, Suction available, Emergency Drugs available, Patient being monitored and Timeout performed Patient Re-evaluated:Patient Re-evaluated prior to induction Oxygen Delivery Method: Circle system utilized Preoxygenation: Pre-oxygenation with 100% oxygen Induction Type: IV induction LMA: LMA inserted LMA Size: 5.0 Tube type: Oral Placement Confirmation: positive ETCO2 and breath sounds checked- equal and bilateral Tube secured with: Tape Dental Injury: Teeth and Oropharynx as per pre-operative assessment

## 2018-04-07 NOTE — Op Note (Signed)
Preoperative diagnosis: Right distal ureteral calculus  Postoperative diagnosis: Right distal ureteral calculus  Procedure:  1. Cystoscopy 2. Right ureteroscopy and stone removal 3. Right retrograde pyelography with interpretation  Surgeon: Pryor Curia. M.D.  Anesthesia: General  Complications: None  Intraoperative findings: Right retrograde pyelography demonstrated a filling defect within the distal right ureter consistent with the patient's known calculus without other abnormalities.  EBL: Minimal  Specimens: 1. Right ureteral calculus  Disposition of specimens: [facility] for stone analysis  Indication: Kyle Davidson is a 67 y.o. year old patient with urolithiasis.  He has a symptomatic right distal ureteral calculus with poor pain control. After reviewing the management options for treatment, the patient elected to proceed with the above surgical procedure(s). We have discussed the potential benefits and risks of the procedure, side effects of the proposed treatment, the likelihood of the patient achieving the goals of the procedure, and any potential problems that might occur during the procedure or recuperation. Informed consent has been obtained.  Description of procedure:  The patient was taken to the operating room and general anesthesia was induced.  The patient was placed in the dorsal lithotomy position, prepped and draped in the usual sterile fashion, and preoperative antibiotics were administered. A preoperative time-out was performed.   Cystourethroscopy was performed.  The patient's urethra was examined and was normal. The bladder was then systematically examined in its entirety. There was no evidence for any bladder tumors, stones, or other mucosal pathology.    Attention then turned to the right ureteral orifice and a ureteral catheter was used to intubate the ureteral orifice.  Omnipaque contrast was injected through the ureteral  catheter and a retrograde pyelogram was performed with findings as dictated above.  A 0.38 sensor guidewire was then advanced up the right ureter into the renal pelvis under fluoroscopic guidance. The 6 Fr semirigid ureteroscope was then advanced into the ureter next to the guidewire and the calculus was identified.  The stone was then removed intact with an N gauge basket.  Reinspection of the ureter revealed no remaining visible stones or fragments.   No stent was required.  The bladder was then emptied and the procedure ended.  The patient appeared to tolerate the procedure well and without complications.  The patient was able to be awakened and transferred to the recovery unit in satisfactory condition.

## 2018-04-07 NOTE — Anesthesia Preprocedure Evaluation (Signed)
Anesthesia Evaluation  Patient identified by MRN, date of birth, ID band Patient awake    Reviewed: Allergy & Precautions, NPO status , Patient's Chart, lab work & pertinent test results  History of Anesthesia Complications (+) PONV and history of anesthetic complications  Airway Mallampati: I  TM Distance: >3 FB Neck ROM: Full    Dental  (+) Teeth Intact   Pulmonary    breath sounds clear to auscultation       Cardiovascular hypertension,  Rhythm:Regular Rate:Normal     Neuro/Psych  Neuromuscular disease    GI/Hepatic hiatal hernia, GERD  ,  Endo/Other    Renal/GU Renal disease     Musculoskeletal  (+) Arthritis ,   Abdominal   Peds  Hematology   Anesthesia Other Findings   Reproductive/Obstetrics                             Anesthesia Physical  Anesthesia Plan  ASA: II  Anesthesia Plan: General   Post-op Pain Management:    Induction: Intravenous  PONV Risk Score and Plan:   Airway Management Planned: LMA  Additional Equipment:   Intra-op Plan:   Post-operative Plan: Extubation in OR  Informed Consent: I have reviewed the patients History and Physical, chart, labs and discussed the procedure including the risks, benefits and alternatives for the proposed anesthesia with the patient or authorized representative who has indicated his/her understanding and acceptance.   Dental advisory given  Plan Discussed with: CRNA  Anesthesia Plan Comments:         Anesthesia Quick Evaluation

## 2018-04-07 NOTE — H&P (Signed)
Office Visit Report     04/07/2018   --------------------------------------------------------------------------------   Kyle Davidson  MRN: [id number]  PRIMARY CARE:  Eino Farber, MD  DOB: 30-Mar-1951, 67 year old Male  REFERRING:  Ammie Dalton, NP  SSN: [id number]  PROVIDER:  Festus Aloe, M.D.    TREATING:  Jimmey Ralph    LOCATION:  [facility] [phone]   --------------------------------------------------------------------------------   CC/HPI: 03/26/18: Underwent shockwave lithotripsy for the large stone in the right lower pole thought to be contributing to the patient's pain/discomfort as well as hematuria 6/201/9. In the interval since the procedure itself the patient states passing multiple stone fragments and bring those in today for evaluation. He was seen in the ED on the 23rd for pain and blood in the urine. A CT scan showed no ureteral stones or hydro. There was few passed fragments in the bladder as well. He has not had any recent right-sided pain/discomfort since then. Denies burning or painful urination. He states voiding at his baseline. Denies gross hematuria. He is afebrile.   04/07/18: Presents today     CC: I have pain in the flank.  HPI: Kyle Davidson is a 67 year-old male established patient who is here for flank pain.  Acute onset of right flank pain X 24 hrs. Pain is not controlled with PRN Oxycodone. Denies vomiting but has some nausea. LBM 24 hrs ago.    The problem is on the right side. His pain started about approximately 04/06/2018. The pain is sharp. The intensity of his pain is rated as a 10. The pain is constant. The pain does radiate.   None< makes the pain better. Nothing causes the pain to become worse. He was treated with the following pain medication(s): Oxycodone.   He has had this same pain previously. He has had kidney stones.     ALLERGIES: Codeine Derivatives - Headache    MEDICATIONS: Omeprazole 40 mg  capsule,delayed release  Aspirin Ec 81 mg tablet, delayed release Oral  Fish Oil CAPS Oral  Oxycodone-Acetaminophen 5 mg-325 mg tablet 1-2 tablet PO Q 6 H PRN  Tylenol PRN  Vitamin B12     GU PSH: Cystoscopy - 03/01/2018 ESWL - 03/11/2018, 2017    NON-GU PSH: Back surgery Cataract surgery, Right Diagnostic Colonoscopy - 2017 Hernia Repair - 2015 Shoulder Surgery (Unspecified)    GU PMH: Microscopic hematuria - 03/01/2018 BPH w/LUTS - 01/11/2018, Benign localized prostatic hyperplasia with lower urinary tract symptoms (LUTS), - 2015 Microscopic hematuria, He has a paternal hx of bladder and prostate cancer. He has known hx of nephrolithiasis with stable appearing stone burden on the right. No symptoms today to suggest infection, obstructing ureteral calculi. I recommend today Further evaluation given his history. He's had CT scans in 2017/18 that did not indicate renal masses or lesions. I'll defer a CT at this time. He will need follow-up with His urologist for cystoscopy. I'll order a renal ultrasound to be performed at that visit to determine need for further upper tract imaging. - 01/11/2018 Renal calculus - 01/11/2018, Nephrolithiasis, - 8938 Renal colic, Renal colic - 1017 Ureteral calculus, Calculus of right ureter - 2017 Encounter for Prostate Cancer screening, Prostate cancer screening - 2015 Epididymal cyst, Epididymal cyst - 2015 Nocturia, Nocturia - 2015 Urinary Frequency, Increased urinary frequency - 2015 Weak Urinary Stream, Weak urinary stream - 2015 Epididymitis, Epididymitis, left - 2015 Other difficulties with micturition, Urine stream spraying - 2015 Straining on Urination, Straining on urination -  December 25, 2013 Testicular pain, unspecified, Testicular pain - 12/25/13 Urinary Retention, Unspec, Incomplete bladder emptying - 2013/12/25    NON-GU PMH: Encounter for general adult medical examination without abnormal findings, Encounter for preventive health examination - 26-Dec-2015 Personal  history of other diseases of the circulatory system, History of cardiac murmur - 12/25/13 Personal history of other diseases of the digestive system, History of gastroesophageal reflux (GERD) - 12-25-2013 Personal history of other diseases of the musculoskeletal system and connective tissue, History of arthritis - 12-25-2013 Personal history of other mental and behavioral disorders, History of depression - 2013-12-25    FAMILY HISTORY: Death of family member - Runs In Family Hematuria - Runs In Family Kidney Cancer - Runs In Family Kidney Stones - Runs In Family Prostate Cancer - Runs In Family   SOCIAL HISTORY: Marital Status: Married Preferred Language: English; Ethnicity: Not Hispanic Or Latino; Race: White Current Smoking Status: Patient has never smoked.   Tobacco Use Assessment Completed: Used Tobacco in last 30 days? Does not use smokeless tobacco. Has never drank.  Does not use drugs. Does not drink caffeine.    REVIEW OF SYSTEMS:    GU Review Male:   Patient reports hard to postpone urination. Patient denies frequent urination, burning/ pain with urination, get up at night to urinate, leakage of urine, stream starts and stops, trouble starting your stream, have to strain to urinate , erection problems, and penile pain.  Gastrointestinal (Upper):   Patient reports nausea. Patient denies vomiting and indigestion/ heartburn.  Gastrointestinal (Lower):   Patient denies diarrhea and constipation.  Constitutional:   Patient denies fever, night sweats, weight loss, and fatigue.  Skin:   Patient denies skin rash/ lesion and itching.  Eyes:   Patient denies blurred vision and double vision.  Ears/ Nose/ Throat:   Patient denies sore throat and sinus problems.  Hematologic/Lymphatic:   Patient denies swollen glands and easy bruising.  Cardiovascular:   Patient denies leg swelling and chest pains.  Respiratory:   Patient denies shortness of breath and cough.  Endocrine:   Patient denies excessive thirst.   Musculoskeletal:   Patient reports back pain. Patient denies joint pain.  Neurological:   Patient denies headaches and dizziness.  Psychologic:   Patient denies depression and anxiety.   VITAL SIGNS:      04/07/2018 09:48 AM  Weight 208 lb / 94.35 kg  Height 73 in / 185.42 cm  BP 192/97 mmHg  Pulse 59 /min  Temperature 97.9 F / 36.6 C  BMI 27.4 kg/m   MULTI-SYSTEM PHYSICAL EXAMINATION:    Constitutional: Well-nourished. No physical deformities. Normally developed. Good grooming. Appears in acute pain. Pacing in exam room  Respiratory: No labored breathing, no use of accessory muscles. Normal breath sounds.   Cardiovascular: Regular rate and rhythm. No murmur, no gallop. Normal temperature, normal extremity pulses, no swelling, no varicosities.   Skin: No paleness, no jaundice, no cyanosis. No lesion, no ulcer, no rash.   Neurologic / Psychiatric: Oriented to time, oriented to place, oriented to person. No depression, no anxiety, no agitation.   Gastrointestinal: No mass, no tenderness, no rigidity, non obese abdomen. (+R) CVAT  Musculoskeletal: Normal gait and station of head and neck.     PAST DATA REVIEWED:  Source Of History:  Patient  Records Review:   Previous Patient Records  Urine Test Review:   Urinalysis  X-Ray Review: C.T. Stone Protocol: Reviewed Films. Reviewed Report. 03/14/18: bilateral small nonobstructing renal calculi. Small stone fragments w/in bladder. No  ureteral calculus or hydronephrosis post lithotripsy.     01/11/18 02/08/14  PSA  Total PSA 0.47 ng/mL 0.38     PROCEDURES:         C.T. Urogram - [id number]      Moderate/severe right hydronephrosis and dilated ureter to level of distal 6 mm UVJ calculus. Bilateral tiny renal calculi noted.   IMPRESSION:  Moderate to severe right hydroureteronephrosis due to 2 calculi in  distal right ureter, largest measuring 5 mm.   Tiny bilateral renal calculi.         Urinalysis w/Scope - 81001 Dipstick Dipstick  Cont'd Micro  Specimen: Voided Bilirubin: Neg WBC/hpf: NS (Not Seen)  Color: Yellow Ketones: Neg RBC/hpf: 3 - 10/hpf  Appearance: Clear Blood: 3+ Bacteria: Rare  Specific Gravity: 1.025 Protein: Trace Cystals: NS (Not Seen)  pH: 6.0 Urobilinogen: 0.2 Casts: Hyaline  Glucose: Neg Nitrites: Neg Trichomonas: Not Present    Leukocyte Esterase: Neg Mucous: Not Present      Epithelial Cells: 0 - 5/hpf      Yeast: NS (Not Seen)      Sperm: Not Present         Morphine 8mg  - 28003, K9179 Qty: 8 Adm. By: Netta Cedars  Unit: mg Lot No [id number]  Route: IM Exp. Date 03/23/2019  Freq: None Mfgr.:   Site: Right Buttock         Phenergan 25mg  - J2550, [id number] Qty: 25 Adm. By: Netta Cedars  Unit: mg Lot No [id number]  Route: IM Exp. Date   Freq: None Mfgr.:   Site: Left Buttock   ASSESSMENT:      ICD-10 Details  1 GU:   Flank Pain - R10.84 Right, Acute - Morphine 8 mg IM today. Secondary to distal 6 mm UVJ calculus.   3   Microscopic hematuria - R31.1 Stable, Chronic - Secondary to distal 6 mm UVJ calculus.   4   Ureteral obstruction secondary to calculous - N13.2 Right, Acute - Secondary to distal 6 mm UVJ calculus.   5   Ureteral calculus - N20.1 Right, Worsening, Chronic - Now with moderate/severe right hydronephrosis secondary to distal 6 mm UVJ calculus. Discussed situation with Dr. Alinda Money and since pain is not controlled with PRN Morphine/Promethazine, and PO Oxycodone 5/325 mg would proceed with urgent cystourethroscopy, (R) RPG, stone extraction, and possible double J stent placement.   2 NON-GU:   Nausea - R11.0 Acute - Promethazine 25 mg IM today   PLAN:           Orders X-Rays: C.T. Stone Protocol Without Contrast  X-Ray Notes: History:  Hematuria: Yes/No  Patient to see MD after exam: Yes/No  Previous exam: CT / IVP/ US/ KUB/ None  When:  Where:  Diabetic: Yes/ No  BUN/ Creatinine:  Date of last BUN Creatinine:  Weight in pounds:  Allergy- IV Contrast: Yes/  No  Conflicting diabetic meds: Yes/ No  Diabetic Meds:  Prior Authorization #: [id number]           Schedule Procedure: 04/07/2018 at [facility] - [phone] - Morphine 8mg  (Ther/Proph/Diag Inj, [place]/Im) - 92010, O7121  Procedure: 04/07/2018 at [facility] - [phone] - Phenergan 25mg  (Phenergan Per 50 Mg) - J2550, 32549          Document Letter(s):  Created for Patient: Clinical Summary         Notes:   For ureteroscopy I described the risks which include heart attack, stroke, pulmonary embolus, death, bleeding,  infection, damage to contiguous structures, positioning injury, ureteral stricture, ureteral avulsion, ureteral injury, need for ureteral stent, inability to perform ureteroscopy, need for an interval procedure, inability to clear stone burden, stent discomfort and pain.Both pt and wife are in agreement and wish to proceed.         Next Appointment:      Next Appointment: 05/28/2018 08:00 AM    Appointment Type: KUB    Location: [facility] [phone]    Provider: KUB KUB    Reason for Visit: 2 mo ck/KUB-gibson      * Signed by Jimmey Ralph on 04/07/18 at 11:31 AM (EDT)*

## 2018-04-08 ENCOUNTER — Encounter (HOSPITAL_COMMUNITY): Payer: Self-pay | Admitting: Urology

## 2018-05-03 ENCOUNTER — Telehealth: Payer: Self-pay

## 2018-05-03 NOTE — Telephone Encounter (Signed)
Sent referral to scheduling, attached notes to August file.   

## 2018-05-04 ENCOUNTER — Encounter: Payer: Self-pay | Admitting: Cardiology

## 2018-05-04 ENCOUNTER — Other Ambulatory Visit: Payer: Self-pay | Admitting: Physician Assistant

## 2018-05-04 ENCOUNTER — Ambulatory Visit
Admission: RE | Admit: 2018-05-04 | Discharge: 2018-05-04 | Disposition: A | Discharge status: Home / Self Care | Payer: 59 | Source: Ambulatory Visit | Attending: Physician Assistant | Admitting: Physician Assistant

## 2018-05-04 DIAGNOSIS — R0602 Shortness of breath: Secondary | ICD-10-CM

## 2018-05-04 DIAGNOSIS — R079 Chest pain, unspecified: Secondary | ICD-10-CM

## 2018-05-04 NOTE — Progress Notes (Signed)
Cardiology Office Note   Date:  05/05/2018   ID:  Pio, Eatherly [birth year], MRN [id number]  PCP:  Heywood Bene, PA-C  Cardiologist:   No primary care provider on file. Referring:  Heywood Bene, *   Chief Complaint  Patient presents with  . Fatigue      History of Present Illness: Kyle Davidson is a 67 y.o. male who is referred by Darrold Junker for evaluation of chest pain and SOB. He was seen last by Dr. Meda Coffee in 2015.  He had chest pain and had a negative Lexiscan Myoview. (Exercise was tried but he had a poor exercise tolerance.) States his chief complaint is fatigue. He is tired after sleeping and throughout the day. He snores. His chest pain is mostly pressure and it is constant with intermittent radiation from his upper abdomen and to his shoulders. SOB associated with his chest pressure as well as lying at night, though mostly describes it as difficulty taking a deep breath. Chest pressure and SOB not necessarily associated with exertion, it can happen while he is resting. No PND or leg swelling. Also reports hearing his heart beat at night associated with lightheadedness. No LOC. He describes decreased exercise tolerance to where he only can do the elliptical for 4 minutes.    Past Medical History:  Diagnosis Date  . Anal fistula   . Arthritis   . BPH (benign prostatic hyperplasia)   . GERD (gastroesophageal reflux disease)   . Hiatal hernia   . History of basal cell carcinoma excision    nose and face  . History of esophageal dilatation    10-25-2015  . History of kidney stones    past hx. "was told presntly has one in right kidny-not bothersome.  Marland Kitchen History of urinary retention   . History of vertebral fracture    neck x2 area's per pt  . Lower urinary tract symptoms (LUTS)   . PONV (postoperative nausea and vomiting)    occ  . Rash    foot  . UTI (urinary tract infection)     Past Surgical History:  Procedure Laterality Date  .  CARDIOVASCULAR STRESS TEST  02-20-2014   Low risk lexiscan nuclear study w/ no reversible ischemia/  normal LV function and wall motion, ef 67%  . CYSTOSCOPY WITH RETROGRADE PYELOGRAM, URETEROSCOPY AND STENT PLACEMENT Right 04/07/2018   Procedure: CYSTOSCOPY WITH RETROGRADE PYELOGRAM, URETEROSCOPY AND STONE EXTRACTION;  Surgeon: Raynelle Bring, MD;  Location: [facility];  Service: Urology;  Laterality: Right;  . ESOPHAGOGASTRODUODENOSCOPY (EGD) WITH PROPOFOL N/A 11/19/2015   Procedure: ESOPHAGOGASTRODUODENOSCOPY (EGD) WITH PROPOFOL;  Surgeon: Laurence Spates, MD;  Location: [facility];  Service: Endoscopy;  Laterality: N/A;  . EXTRACORPOREAL SHOCK WAVE LITHOTRIPSY Right 12-10-2015  . EXTRACORPOREAL SHOCK WAVE LITHOTRIPSY Right 03/11/2018   Procedure: RIGHT EXTRACORPOREAL SHOCK WAVE LITHOTRIPSY (ESWL);  Surgeon: Raynelle Bring, MD;  Location: [facility];  Service: Urology;  Laterality: Right;  . FISTULOTOMY N/A 03/21/2016   Procedure: FISTULOTOMY;  Surgeon: Leighton Ruff, MD;  Location: [facility];  Service: General;  Laterality: N/A;  . HAND SURGERY  x2  2008   repair left thumb injury  . INGUINAL HERNIA REPAIR Bilateral 08/16/2013   Procedure: LAPAROSCOPIC BILATERAL INGUINAL HERNIA WITH UMBILICAL HERNIA;  Surgeon: Joyice Faster. Cornett, MD;  Location: [facility];  Service: General;  Laterality: Bilateral;  . INSERTION OF MESH Bilateral 08/16/2013   Procedure: INSERTION OF MESH;  Surgeon: Joyice Faster. Cornett, MD;  Location: [facility];  Service: General;  Laterality: Bilateral;  . LUMBAR FUSION  x2  last one 1997 (approx)  . NASAL SEPTUM SURGERY  1980's  . SAVORY DILATION N/A 11/19/2015   Procedure: SAVORY DILATION;  Surgeon: Laurence Spates, MD;  Location: [facility];  Service: Endoscopy;  Laterality: N/A;  . SHOULDER ARTHROSCOPY W/ ROTATOR CUFF REPAIR Bilateral right 2013/  left 1990's   and Debridement labral tear  . TRANSTHORACIC ECHOCARDIOGRAM  02-20-2014   normal LV function and wall motion , ef  50-55%/  mild dilated ascending aorta/  trivial MR and TR/  mild LAE     Current Outpatient Medications  Medication Sig Dispense Refill  . acetaminophen (TYLENOL) 500 MG tablet Take 1,000 mg by mouth daily as needed for mild pain, moderate pain or headache.     Marland Kitchen aspirin EC 81 MG tablet Take 81 mg by mouth daily.    . Omega-3 Fatty Acids (FISH OIL) 1000 MG CAPS Take 1,000-2,000 capsules by mouth See admin instructions. Take 2 caps in am and 1 cap in pm    . omeprazole (PRILOSEC) 40 MG capsule Take 40 mg by mouth 2 (two) times daily.  5  . vitamin B-12 (CYANOCOBALAMIN) 500 MCG tablet Take 500 mcg by mouth daily.     No current facility-administered medications for this visit.     Allergies:   Codeine    Social History:  The patient  reports that he has never smoked. He has never used smokeless tobacco. He reports that he does not drink alcohol or use drugs.   Family History:  The patient's family history includes Arrhythmia in his brother; Bladder Cancer in his father; Heart attack in his father; Heart disease in his father and mother; Prostate cancer in his father.    ROS:  Please see the history of present illness.   Otherwise, review of systems are positive for headache, leg pain, ankle swelling.   All other systems are reviewed and negative.    PHYSICAL EXAM: VS:  BP 120/80   Pulse 72   Ht 6\' 3"  (1.905 m)   Wt 208 lb 9.6 oz (94.6 kg)   BMI 26.07 kg/m  , BMI Body mass index is 26.07 kg/m. GENERAL:  Well appearing HEENT:  Pupils equal round and reactive, fundi not visualized, oral mucosa unremarkable NECK:  No jugular venous distention, waveform within normal limits, carotid upstroke brisk and symmetric, no bruits, no thyromegaly LYMPHATICS:  No cervical, inguinal adenopathy LUNGS:  Clear to auscultation bilaterally BACK:  No CVA tenderness CHEST:  Unremarkable HEART:  PMI not displaced or sustained,S1 and S2 within normal limits, no S3, no S4, no clicks, no rubs, no  murmurs ABD:  Flat, positive bowel sounds normal in frequency in pitch, no bruits, no rebound, no guarding, no midline pulsatile mass, no hepatomegaly, no splenomegaly EXT:  2 plus pulses throughout, no edema, no cyanosis no clubbing SKIN:  No rashes no nodules NEURO:  Cranial nerves II through XII grossly intact, motor grossly intact throughout PSYCH:  Cognitively intact, oriented to person place and time    EKG:  EKG is ordered today. The ekg ordered today demonstrates normal sinus rhythm, rate 72. Normal axis, normal intervals.   Recent Labs: 03/14/2018: BUN 12; Creatinine, Ser 1.02; Potassium 3.6; Sodium 143    Lipid Panel    Component Value Date/Time   CHOL 201 (H) 02/01/2014 0950   TRIG 113.0 02/01/2014 0950   HDL 28.90 (L) 02/01/2014 0950   CHOLHDL 7  02/01/2014 0950   VLDL 22.6 02/01/2014 0950   LDLCALC 150 (H) 02/01/2014 0950      Wt Readings from Last 3 Encounters:  05/05/18 208 lb 9.6 oz (94.6 kg)  04/07/18 208 lb (94.3 kg)  03/11/18 209 lb 9.6 oz (95.1 kg)      Other studies Reviewed: Additional studies/ records that were reviewed today include: annual physical with primary. Review of the above records demonstrates: Cholesterol 153, Triglycerides 117, HDL 28, LDL 115. A1c 5.4.   Please see elsewhere in the note.    ASSESSMENT AND PLAN:  FATIGUE: He would most benefit from a sleep study evaluation given his fatigu. Oxygen saturation before and during walking around office 98-95%, will obtain BNP and TSH.  CHEST PAIN:  He is with atypical chest pain do not feel he would benefit with additional stress testing. He is describing hearing his heart beat pound at night associated with lightheadedness, would like to evaluated with a 24 hour Holter monitor.    Current medicines are reviewed at length with the patient today.  The patient does not have concerns regarding medicines.  The following changes have been made:  no change  Labs/ tests ordered today include:  EKG  Orders Placed This Encounter  Procedures  . B Nat Peptide  . TSH  . HOLTER MONITOR - 24 HOUR  . EKG 12-Lead  . Split night study     Disposition:   FU with me after 24 hour Holter monitor.     Signed, Minus Breeding, MD  05/05/2018 10:51 AM    [facility]

## 2018-05-05 ENCOUNTER — Ambulatory Visit (INDEPENDENT_AMBULATORY_CARE_PROVIDER_SITE_OTHER): Payer: 59 | Admitting: Cardiology

## 2018-05-05 ENCOUNTER — Encounter: Payer: Self-pay | Admitting: Cardiology

## 2018-05-05 ENCOUNTER — Ambulatory Visit (INDEPENDENT_AMBULATORY_CARE_PROVIDER_SITE_OTHER): Payer: 59

## 2018-05-05 VITALS — BP 120/80 | HR 72 | Ht 75.0 in | Wt 208.6 lb

## 2018-05-05 DIAGNOSIS — R5383 Other fatigue: Secondary | ICD-10-CM | POA: Diagnosis not present

## 2018-05-05 DIAGNOSIS — R002 Palpitations: Secondary | ICD-10-CM

## 2018-05-05 DIAGNOSIS — R0602 Shortness of breath: Secondary | ICD-10-CM

## 2018-05-05 NOTE — Patient Instructions (Signed)
Medication Instructions:  Continue current medication  If you need a refill on your cardiac medications before your next appointment, please call your pharmacy.  Labwork: BNP and TSH Today HERE IN OUR OFFICE AT [facility]  Take the provided lab slips with you to the lab for your blood draw.   You will NOT need to fast   Testing/Procedures: Your physician has recommended that you wear a holter monitor 24 hours. Holter monitors are medical devices that record the heart's electrical activity. Doctors most often use these monitors to diagnose arrhythmias. Arrhythmias are problems with the speed or rhythm of the heartbeat. The monitor is a small, portable device. You can wear one while you do your normal daily activities. This is usually used to diagnose what is causing palpitations/syncope (passing out).  Your physician has recommended that you have a sleep study. This test records several body functions during sleep, including: brain activity, eye movement, oxygen and carbon dioxide blood levels, heart rate and rhythm, breathing rate and rhythm, the flow of air through your mouth and nose, snoring, body muscle movements, and chest and belly movement.   Follow-Up: Your physician wants you to follow-up in: 1 Month.     Thank you for choosing [facility] at [facility]!!

## 2018-05-06 LAB — TSH: TSH: 2.41 u[IU]/mL (ref 0.450–4.500)

## 2018-05-06 LAB — BRAIN NATRIURETIC PEPTIDE: BNP: 24 pg/mL (ref 0.0–100.0)

## 2018-05-11 ENCOUNTER — Telehealth: Payer: Self-pay | Admitting: Cardiology

## 2018-05-11 NOTE — Telephone Encounter (Signed)
Spoke with pt. Adv pt that his holter monitor results are still pending and that we will give him a call once the results are received and reviewed by Dr.Hochrein.  Adv pt that the process for precert for his sleep study can take a couple of weeks. Once hi study is approved our sleep coordinator Mariann Laster will call him directly to schedule.  Pt voiced appreciation for the call back and verbalized understanding

## 2018-05-11 NOTE — Telephone Encounter (Signed)
New Message   Patient is calling in reference to his holter monitor results. He also wants to know if his sleep study has been set up. Please call to discuss.

## 2018-05-12 ENCOUNTER — Telehealth: Payer: Self-pay

## 2018-05-12 NOTE — Telephone Encounter (Signed)
Entered precert/auth request via [place] portal and faxed request to [phone] Service Reference Number [id number].

## 2018-05-17 NOTE — Telephone Encounter (Signed)
Message from [employer] request for in lab sleep study denied ref # [id number].  Pt notified, message to Dr Percival Spanish.

## 2018-05-21 ENCOUNTER — Other Ambulatory Visit: Payer: Self-pay | Admitting: Cardiology

## 2018-05-21 ENCOUNTER — Telehealth: Payer: Self-pay | Admitting: *Deleted

## 2018-05-21 DIAGNOSIS — R0683 Snoring: Secondary | ICD-10-CM

## 2018-05-21 DIAGNOSIS — R400 Somnolence: Secondary | ICD-10-CM

## 2018-05-21 DIAGNOSIS — R5383 Other fatigue: Secondary | ICD-10-CM

## 2018-05-21 DIAGNOSIS — I10 Essential (primary) hypertension: Secondary | ICD-10-CM

## 2018-05-21 NOTE — Telephone Encounter (Signed)
Patient returned a call to me and was notified of his HST appointment. He declined having the study at this time. Patient states that he needs to get some of his current medical bills paid for. He will call back to schedule when he is ready to proceed. Patient notified that if it has been over 30 days since he has been seen by the MD he will need another office appointment to address this. Patient voiced verbal understanding of what was told to him. Dr Percival Spanish will be notified of the patient's decision.

## 2018-05-21 NOTE — Telephone Encounter (Signed)
Left message to return a call to notify of sleep study appointment.

## 2018-06-10 ENCOUNTER — Ambulatory Visit: Payer: 59 | Admitting: Cardiology

## 2018-06-15 ENCOUNTER — Encounter (HOSPITAL_BASED_OUTPATIENT_CLINIC_OR_DEPARTMENT_OTHER): Payer: Self-pay

## 2018-08-12 ENCOUNTER — Emergency Department (HOSPITAL_COMMUNITY)
Admission: EM | Admit: 2018-08-12 | Discharge: 2018-08-12 | Disposition: A | Discharge status: Home / Self Care | Payer: 59 | Attending: Emergency Medicine | Admitting: Emergency Medicine

## 2018-08-12 ENCOUNTER — Emergency Department (HOSPITAL_COMMUNITY): Payer: 59

## 2018-08-12 ENCOUNTER — Other Ambulatory Visit: Payer: Self-pay

## 2018-08-12 ENCOUNTER — Encounter (HOSPITAL_COMMUNITY): Payer: Self-pay

## 2018-08-12 DIAGNOSIS — Y9389 Activity, other specified: Secondary | ICD-10-CM | POA: Diagnosis not present

## 2018-08-12 DIAGNOSIS — R109 Unspecified abdominal pain: Secondary | ICD-10-CM | POA: Insufficient documentation

## 2018-08-12 DIAGNOSIS — I77819 Aortic ectasia, unspecified site: Secondary | ICD-10-CM | POA: Diagnosis not present

## 2018-08-12 DIAGNOSIS — M542 Cervicalgia: Secondary | ICD-10-CM | POA: Insufficient documentation

## 2018-08-12 DIAGNOSIS — N1330 Unspecified hydronephrosis: Secondary | ICD-10-CM

## 2018-08-12 DIAGNOSIS — S0990XA Unspecified injury of head, initial encounter: Secondary | ICD-10-CM | POA: Insufficient documentation

## 2018-08-12 DIAGNOSIS — Z7982 Long term (current) use of aspirin: Secondary | ICD-10-CM | POA: Insufficient documentation

## 2018-08-12 DIAGNOSIS — Z79899 Other long term (current) drug therapy: Secondary | ICD-10-CM | POA: Insufficient documentation

## 2018-08-12 DIAGNOSIS — I10 Essential (primary) hypertension: Secondary | ICD-10-CM | POA: Insufficient documentation

## 2018-08-12 DIAGNOSIS — R309 Painful micturition, unspecified: Secondary | ICD-10-CM | POA: Insufficient documentation

## 2018-08-12 DIAGNOSIS — Y999 Unspecified external cause status: Secondary | ICD-10-CM | POA: Insufficient documentation

## 2018-08-12 DIAGNOSIS — Y92410 Unspecified street and highway as the place of occurrence of the external cause: Secondary | ICD-10-CM | POA: Diagnosis not present

## 2018-08-12 DIAGNOSIS — Z85828 Personal history of other malignant neoplasm of skin: Secondary | ICD-10-CM | POA: Diagnosis not present

## 2018-08-12 DIAGNOSIS — R030 Elevated blood-pressure reading, without diagnosis of hypertension: Secondary | ICD-10-CM

## 2018-08-12 DIAGNOSIS — R0789 Other chest pain: Secondary | ICD-10-CM | POA: Diagnosis not present

## 2018-08-12 DIAGNOSIS — R930 Abnormal findings on diagnostic imaging of skull and head, not elsewhere classified: Secondary | ICD-10-CM

## 2018-08-12 LAB — I-STAT CHEM 8, ED
BUN: 15 mg/dL (ref 8–23)
CREATININE: 0.8 mg/dL (ref 0.61–1.24)
Calcium, Ion: 1.13 mmol/L — ABNORMAL LOW (ref 1.15–1.40)
Chloride: 105 mmol/L (ref 98–111)
GLUCOSE: 92 mg/dL (ref 70–99)
HCT: 50 % (ref 39.0–52.0)
HEMOGLOBIN: 17 g/dL (ref 13.0–17.0)
Potassium: 3.7 mmol/L (ref 3.5–5.1)
Sodium: 142 mmol/L (ref 135–145)
TCO2: 28 mmol/L (ref 22–32)

## 2018-08-12 LAB — COMPREHENSIVE METABOLIC PANEL
ALT: 16 U/L (ref 0–44)
ANION GAP: 10 (ref 5–15)
AST: 19 U/L (ref 15–41)
Albumin: 4.7 g/dL (ref 3.5–5.0)
Alkaline Phosphatase: 64 U/L (ref 38–126)
BUN: 15 mg/dL (ref 8–23)
CO2: 26 mmol/L (ref 22–32)
Calcium: 9.2 mg/dL (ref 8.9–10.3)
Chloride: 106 mmol/L (ref 98–111)
Creatinine, Ser: 0.78 mg/dL (ref 0.61–1.24)
Glucose, Bld: 93 mg/dL (ref 70–99)
POTASSIUM: 3.7 mmol/L (ref 3.5–5.1)
SODIUM: 142 mmol/L (ref 135–145)
Total Bilirubin: 1.5 mg/dL — ABNORMAL HIGH (ref 0.3–1.2)
Total Protein: 8 g/dL (ref 6.5–8.1)

## 2018-08-12 LAB — CBC WITH DIFFERENTIAL/PLATELET
ABS IMMATURE GRANULOCYTES: 0.02 10*3/uL (ref 0.00–0.07)
BASOS ABS: 0 10*3/uL (ref 0.0–0.1)
Basophils Relative: 0 %
EOS PCT: 1 %
Eosinophils Absolute: 0.1 10*3/uL (ref 0.0–0.5)
HCT: 49.9 % (ref 39.0–52.0)
HEMOGLOBIN: 16.9 g/dL (ref 13.0–17.0)
IMMATURE GRANULOCYTES: 0 %
Lymphocytes Relative: 25 %
Lymphs Abs: 1.9 10*3/uL (ref 0.7–4.0)
MCH: 32.1 pg (ref 26.0–34.0)
MCHC: 33.9 g/dL (ref 30.0–36.0)
MCV: 94.7 fL (ref 80.0–100.0)
Monocytes Absolute: 0.7 10*3/uL (ref 0.1–1.0)
Monocytes Relative: 9 %
NEUTROS ABS: 4.8 10*3/uL (ref 1.7–7.7)
NEUTROS PCT: 65 %
NRBC: 0 % (ref 0.0–0.2)
Platelets: 166 10*3/uL (ref 150–400)
RBC: 5.27 MIL/uL (ref 4.22–5.81)
RDW: 12.5 % (ref 11.5–15.5)
WBC: 7.5 10*3/uL (ref 4.0–10.5)

## 2018-08-12 MED ORDER — IOPAMIDOL (ISOVUE-300) INJECTION 61%
100.0000 mL | Freq: Once | INTRAVENOUS | Status: AC | PRN
  Administered 2018-08-12: 100 mL via INTRAVENOUS

## 2018-08-12 MED ORDER — MORPHINE SULFATE (PF) 4 MG/ML IV SOLN
4.0000 mg | Freq: Once | INTRAVENOUS | Status: AC
  Administered 2018-08-12: 4 mg via INTRAVENOUS
  Filled 2018-08-12: qty 1

## 2018-08-12 MED ORDER — HYDROCODONE-ACETAMINOPHEN 5-325 MG PO TABS: 1.0000 | ORAL_TABLET | Freq: Four times a day (QID) | ORAL | 0 refills | Status: DC | PRN

## 2018-08-12 MED ORDER — IOPAMIDOL (ISOVUE-300) INJECTION 61%
INTRAVENOUS | Status: AC
  Filled 2018-08-12: qty 100

## 2018-08-12 MED ORDER — SODIUM CHLORIDE 0.9 % IV BOLUS
1000.0000 mL | Freq: Once | INTRAVENOUS | Status: AC
  Administered 2018-08-12: 1000 mL via INTRAVENOUS

## 2018-08-12 MED ORDER — SODIUM CHLORIDE 0.9 % IV SOLN: INTRAVENOUS | Status: DC
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
## 2018-08-12 NOTE — ED Notes (Signed)
Patient transported to CT 
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
## 2018-08-13 DIAGNOSIS — I7090 Unspecified atherosclerosis: Secondary | ICD-10-CM | POA: Insufficient documentation

## 2018-08-24 ENCOUNTER — Telehealth: Payer: Self-pay | Admitting: Cardiology

## 2018-08-24 NOTE — Telephone Encounter (Signed)
OK 

## 2018-08-24 NOTE — Telephone Encounter (Signed)
OK with me.

## 2018-08-24 NOTE — Telephone Encounter (Signed)
  Patient would like to change from seeing Dr Percival Spanish to see Dr Acie Fredrickson

## 2018-08-30 DIAGNOSIS — S39012A Strain of muscle, fascia and tendon of lower back, initial encounter: Secondary | ICD-10-CM | POA: Insufficient documentation

## 2018-08-30 DIAGNOSIS — M5030 Other cervical disc degeneration, unspecified cervical region: Secondary | ICD-10-CM | POA: Insufficient documentation

## 2018-08-30 DIAGNOSIS — S134XXA Sprain of ligaments of cervical spine, initial encounter: Secondary | ICD-10-CM | POA: Insufficient documentation

## 2018-09-02 DIAGNOSIS — M542 Cervicalgia: Secondary | ICD-10-CM | POA: Insufficient documentation

## 2018-09-02 DIAGNOSIS — M545 Low back pain, unspecified: Secondary | ICD-10-CM | POA: Insufficient documentation

## 2018-10-01 ENCOUNTER — Encounter: Payer: Self-pay | Admitting: Cardiovascular Disease

## 2018-10-21 DIAGNOSIS — S40012A Contusion of left shoulder, initial encounter: Secondary | ICD-10-CM | POA: Insufficient documentation

## 2018-10-22 ENCOUNTER — Ambulatory Visit (INDEPENDENT_AMBULATORY_CARE_PROVIDER_SITE_OTHER): Payer: 59 | Admitting: Cardiovascular Disease

## 2018-10-22 ENCOUNTER — Encounter (INDEPENDENT_AMBULATORY_CARE_PROVIDER_SITE_OTHER): Payer: Self-pay

## 2018-10-22 ENCOUNTER — Encounter

## 2018-10-22 ENCOUNTER — Encounter: Payer: Self-pay | Admitting: Cardiovascular Disease

## 2018-10-22 DIAGNOSIS — I77810 Thoracic aortic ectasia: Secondary | ICD-10-CM
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
## 2018-10-22 NOTE — Patient Instructions (Signed)
Medication Instructions:  Your physician recommends that you continue on your current medications as directed. Please refer to the Current Medication list given to you today.  If you need a refill on your cardiac medications before your next appointment, please call your pharmacy.    Lab work: None Ordered    Testing/Procedures: None Ordered   Follow-Up: At [facility], you and your health needs are our priority.  As part of our continuing mission to provide you with exceptional heart care, we have created designated Provider Care Teams.  These Care Teams include your primary Cardiologist (physician) and Advanced Practice Providers (APPs -  Physician Assistants and Nurse Practitioners) who all work together to provide you with the care you need, when you need it. You will need a follow up appointment in:   As Needed.  You may see Mertie Moores, MD or one of the following Advanced Practice Providers on your designated Care Team: Richardson Dopp, PA-C [place], [place] . Daune Perch, NP

## 2018-10-25 ENCOUNTER — Encounter: Payer: Self-pay | Admitting: Cardiovascular Disease

## 2018-10-25 DIAGNOSIS — I77810 Thoracic aortic ectasia: Secondary | ICD-10-CM | POA: Insufficient documentation

## 2018-10-26 DIAGNOSIS — M5412 Radiculopathy, cervical region: Secondary | ICD-10-CM | POA: Insufficient documentation

## 2018-10-26 DIAGNOSIS — M961 Postlaminectomy syndrome, not elsewhere classified: Secondary | ICD-10-CM | POA: Insufficient documentation

## 2018-11-30 ENCOUNTER — Telehealth: Payer: Self-pay

## 2018-11-30 NOTE — Telephone Encounter (Signed)
Call [facility] to obtain more information about a surgical clearance that was sent to our office. LMOM for Judeen Hammans at [facility] to call our office back.

## 2018-12-01 ENCOUNTER — Telehealth: Payer: Self-pay | Admitting: Cardiology

## 2018-12-01 NOTE — Telephone Encounter (Signed)
   [facility] Pre-operative Risk Assessment    Request for surgical clearance:  1. What type of surgery is being performed? ANTERIOR CERVICAL DECOMPRESSION FUSION C6-7   2. When is this surgery scheduled? TBD   3. What type of clearance is required (medical clearance vs. Pharmacy clearance to hold med vs. Both)? MEDICAL  4. Are there any medications that need to be held prior to surgery and how long?ASA   5. Practice name and name of physician performing surgery? [facility]; DR. Rolena Infante   6. What is your office phone number [phone]    7.   What is your office fax number [phone]  8.   Anesthesia type (None, local, MAC, general) ? GENERAL   Kyle Davidson 12/01/2018, 12:52 PM  _________________________________________________________________   (provider comments below)

## 2018-12-01 NOTE — Telephone Encounter (Signed)
I called and left message for Judeen Hammans at [facility] to call back as I am needing further information in regards to pre op clearance . Need anesthesia, meds to be held, clarify procedure, need doctor's name.

## 2018-12-01 NOTE — Telephone Encounter (Signed)
When I called pt he told me he had been holding asa since last week.

## 2018-12-01 NOTE — Telephone Encounter (Signed)
   Primary Cardiologist: Mertie Moores, MD  Chart reviewed as part of pre-operative protocol coverage. Patient was contacted 12/01/2018 in reference to pre-operative risk assessment for pending surgery as outlined below.  Kyle Davidson was last seen on 10/22/18 by Dr. Acie Fredrickson.  Since that day, Kyle Davidson has done well- per Dr. Elmarie Shiley note pt does not need cardiac clearance, he has no known CAD.  We saw pt for elongated aorta which was stable.  Ok to hold Asprin.     Therefore, based on ACC/AHA guidelines, the patient would be at acceptable risk for the planned procedure without further cardiovascular testing.   I will route this recommendation to the requesting party via Epic fax function and remove from pre-op pool.  Please call with questions.  Cecilie Kicks, NP 12/01/2018, 2:24 PM

## 2019-02-11 ENCOUNTER — Ambulatory Visit: Payer: Self-pay | Admitting: Orthopedic Surgery

## 2019-02-15 ENCOUNTER — Ambulatory Visit: Payer: Self-pay | Admitting: Orthopedic Surgery

## 2019-02-15 NOTE — H&P (Signed)
Subjective:   Kyle Davidson is here today for his history and physical. He is scheduled for ACDF at [facility] on 02/23/2019 by Dr. Rolena Infante. Kyle Davidson is a very pleasant 68 year old gentleman who was in his usual state of good to excellent health working his regular job until he was unfortunately injured. Patient has had progressive debilitating neck, left shoulder, left arm, and low back pain which has failed to improve with conservative treatment including medications, physical therapy, and injections. Therefore, he has elected to move forward with surgical intervention. At this point we have obtained surgical clearance from his cardiologist (patient has "enlarged aorta," on ASA) and patient has met with PT and been fitted for ASPEN collar which he has in hand. He is scheduled to see his PCP for surgical clearance later this week and will also meet with anathesia later this week as well as obtain COVID-19 testing and other pre-operative tests.  Patient Active Problem List   Diagnosis Date Noted  . Ascending aorta dilatation (HCC) 10/25/2018  . Appendicolith 04/03/2014  . Dyspnea on exertion 02/01/2014  . Chest pain 02/01/2014  . Essential hypertension 02/01/2014  . Hyperlipidemia 02/01/2014  . Post-op pain 08/22/2013  . Bilateral inguinal hernia 07/11/2013  . Umbilical hernia 64/33/2951  . Unspecified constipation 07/11/2013  . Angioedema of lips 12/26/2011  . GERD (gastroesophageal reflux disease) 12/26/2011  . Dysphagia 12/26/2011  . Sinusitis 12/26/2011  . Elevated BP 12/26/2011  . Shoulder pain 12/26/2011   Past Medical History:  Diagnosis Date  . Anal fistula   . Arthritis   . BPH (benign prostatic hyperplasia)   . GERD (gastroesophageal reflux disease)   . Hiatal hernia   . History of basal cell carcinoma excision    nose and face  . History of esophageal dilatation    10-25-2015  . History of kidney stones    past hx. "was told presntly has one in right kidny-not bothersome.   Marland Kitchen History of urinary retention   . History of vertebral fracture    neck x2 area's per pt  . Lower urinary tract symptoms (LUTS)   . PONV (postoperative nausea and vomiting)    occ  . Rash    foot  . UTI (urinary tract infection)     Past Surgical History:  Procedure Laterality Date  . CARDIOVASCULAR STRESS TEST  02-20-2014   Low risk lexiscan nuclear study w/ no reversible ischemia/  normal LV function and wall motion, ef 67%  . CYSTOSCOPY WITH RETROGRADE PYELOGRAM, URETEROSCOPY AND STENT PLACEMENT Right 04/07/2018   Procedure: CYSTOSCOPY WITH RETROGRADE PYELOGRAM, URETEROSCOPY AND STONE EXTRACTION;  Surgeon: Raynelle Bring, MD;  Location: [facility];  Service: Urology;  Laterality: Right;  . ESOPHAGOGASTRODUODENOSCOPY (EGD) WITH PROPOFOL N/A 11/19/2015   Procedure: ESOPHAGOGASTRODUODENOSCOPY (EGD) WITH PROPOFOL;  Surgeon: Laurence Spates, MD;  Location: [facility];  Service: Endoscopy;  Laterality: N/A;  . EXTRACORPOREAL SHOCK WAVE LITHOTRIPSY Right 12-10-2015  . EXTRACORPOREAL SHOCK WAVE LITHOTRIPSY Right 03/11/2018   Procedure: RIGHT EXTRACORPOREAL SHOCK WAVE LITHOTRIPSY (ESWL);  Surgeon: Raynelle Bring, MD;  Location: [facility];  Service: Urology;  Laterality: Right;  . FISTULOTOMY N/A 03/21/2016   Procedure: FISTULOTOMY;  Surgeon: Leighton Ruff, MD;  Location: [facility];  Service: General;  Laterality: N/A;  . HAND SURGERY  x2  2008   repair left thumb injury  . INGUINAL HERNIA REPAIR Bilateral 08/16/2013   Procedure: LAPAROSCOPIC BILATERAL INGUINAL HERNIA WITH UMBILICAL HERNIA;  Surgeon: Joyice Faster. Cornett, MD;  Location: [place];  Service: General;  Laterality: Bilateral;  . INSERTION OF MESH Bilateral 08/16/2013   Procedure: INSERTION OF MESH;  Surgeon: Joyice Faster. Cornett, MD;  Location: [place];  Service: General;  Laterality: Bilateral;  . LUMBAR FUSION  x2  last one 1997 (approx)  . NASAL SEPTUM SURGERY  1980's  . SAVORY DILATION N/A 11/19/2015   Procedure: SAVORY  DILATION;  Surgeon: Laurence Spates, MD;  Location: [facility];  Service: Endoscopy;  Laterality: N/A;  . SHOULDER ARTHROSCOPY W/ ROTATOR CUFF REPAIR Bilateral right 2013/  left 1990's   and Debridement labral tear  . TRANSTHORACIC ECHOCARDIOGRAM  02-20-2014   normal LV function and wall motion , ef 50-55%/  mild dilated ascending aorta/  trivial MR and TR/  mild LAE    Current Outpatient Medications  Medication Sig Dispense Refill Last Dose  . acetaminophen (TYLENOL) 500 MG tablet Take 1,000 mg by mouth daily as needed for mild pain, moderate pain or headache.    Taking  . aspirin EC 81 MG tablet Take 81 mg by mouth at bedtime.    Taking  . Omega-3 Fatty Acids (FISH OIL) 1000 MG CAPS Take 1,000-2,000 capsules by mouth See admin instructions. Take 2000 mg in the morning and 1000 mg in the evening   Taking  . omeprazole (PRILOSEC) 40 MG capsule Take 40 mg by mouth 2 (two) times daily.  5 Taking  . Turmeric 500 MG CAPS Take 500 mg by mouth daily.   Taking   No current facility-administered medications for this visit.    Allergies  Allergen Reactions  . Codeine Other (See Comments)    Bad headaches.....makes him see things    Social History   Tobacco Use  . Smoking status: Never Smoker  . Smokeless tobacco: Never Used  Substance Use Topics  . Alcohol use: No    Family History  Problem Relation Age of Onset  . Heart disease Mother        No details  . Bladder Cancer Father   . Prostate cancer Father   . Heart disease Father        MI at later age  . Heart attack Father   . Arrhythmia Brother        Had to be cardioverted    Review of Systems As stated in HPI  Objective:   General: AAOX3, well developed and well nourished, NAD  Ambulation: normal gait pattern, uses no assistive device.  Inspection: No obvious deformity, scoleosis, kyphosis, loss of lordotic curve.  Heart: RRR, no rubs, murmers, or gallops  Lungs: CTAB  Abdomen: Normal BSX4, non-tender,  non-distended, no hepatosplenomegaly.  Cervical: He continues to have significant neck and left radicular arm pain. He has 4 out of 5 left triceps strength as well as dysesthesias in the left C7 dermatome. Positive Spurling sign on the left side. Remainder of his upper extremity exam is normal. He has significant neck pain with range of motion and palpation. He also has significant anterior left shoulder pain with range of motion.  Lumbar: Continues to have debilitating low back pain especially with forward flexion and rotation. No significant radicular leg pain. Negative nerve root tension signs in the lower extremity. No focal motor deficits in the lower extremity. Normal gait pattern. Negative Babinski test. Negative Hoffman test.  Cervical MRI: completed on 10/22/18 was reviewed with the patient. I have also reviewed the radiology report. Cervical: No cord signal changes. Severe bilateral C6-7 neural foraminal narrowing with impingement on the exiting C7 nerve roots due to  severe bilateral uncovertebral joint bone spurs. Moderate to severe right C4-5 neural foraminal narrowing with impingement on the right C5 nerve root. Mild to moderate bilateral C5-6 foraminal narrowing.  Lumbar MRI: Advanced degenerative disc disease L3-4 consistent with x-rays. No spondylolisthesis. There is some rotational deformity. Prior laminotomy at L3-4 is noted. There is a artifact in the central region posterior to the mid L3 vertebral body which is not appreciated on plain imaging studies no significant spinal stenosis or foraminal stenosis.  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 Patient has had progressive debilitating neck, left shoulder, left arm, and  low back pain which has failed to improve with conservative treatment including medications, physical therapy, and injections. Therefore, he has elected to move forward with surgical intervention to include ACDF C6-7 at [facility] with Dr. Rolena Infante on 02/23/2019 to treat his C6-7 DDD and cervical radiculopathy.  Plan:    At this point we have gone over the surgical procedure which would be a single level ACDF C6-7. I have demonstrated the surgical procedure with a model and we have discussed risks and benefits.Risks and benefits of surgery were discussed with the patient. These include: Infection, bleeding, death, stroke, paralysis, ongoing or worse pain, need for additional surgery, nonunion, leak of spinal fluid, adjacent segment degeneration requiring additional fusion surgery. Pseudoarthrosis (nonunion)requiring supplemental posterior fixation. Throat pain, swallowing difficulties, hoarseness or change in voice.  All of patients questions were invited and answered.  With respect to his lumbar spine: In the event that his back pain gets progressively worse then we could consider a discogram to confirm the L3-4 is the pain source and possible fusion surgery in the future.  If the anterior shoulder continues to be an issue that I have him follow-up with Dr. supple again for reevaluation.  We will move forward with C6/7 ACDF on 02/23/2019 pending anasthesia consult and clearance from PCP.  Follow-up: 2 weeks s/p surgery in offiice.

## 2019-02-18 NOTE — Progress Notes (Signed)
[id number] - [place], [place] - [address] [place] [address] Phone: [phone] Fax: [phone]      Your procedure is scheduled on June 3  Report to [facility] Main Entrance "A" at 0930 A.M., and check in at the Admitting office.  Call this number if you have problems the morning of surgery:  [phone]  Call [phone] if you have any questions prior to your surgery date Monday-Friday 8am-4pm    Remember:  Do not eat or drink after midnight.    Take these medicines the morning of surgery with A SIP OF WATER  omeprazole (PRILOSEC)  acetaminophen (TYLENOL)  7 days prior to surgery STOP taking any Aspirin (unless otherwise instructed by your surgeon), Aleve, Naproxen, Ibuprofen, Motrin, Advil, Goody's, BC's, all herbal medications, fish oil, and all vitamins.    The Morning of Surgery  Do not wear jewelry, make-up or nail polish.  Do not wear lotions, powders, or perfumes/colognes, or deodorant  Do not shave 48 hours prior to surgery.  Men may shave face and neck.  Do not bring valuables to the hospital.  [facility] is not responsible for any belongings or valuables.  If you are a smoker, DO NOT Smoke 24 hours prior to surgery IF you wear a CPAP at night please bring your mask, tubing, and machine the morning of surgery   Remember that you must have someone to transport you home after your surgery, and remain with you for 24 hours if you are discharged the same day.   Contacts, glasses, hearing aids, dentures or bridgework may not be worn into surgery.    Leave your suitcase in the car.  After surgery it may be brought to your room.  For patients admitted to the hospital, discharge time will be determined by your treatment team.  Patients discharged the day of surgery will not be allowed to drive home.    Special instructions:   [facility]- Preparing For Surgery  Before surgery, you  can play an important role. Because skin is not sterile, your skin needs to be as free of germs as possible. You can reduce the number of germs on your skin by washing with CHG (chlorahexidine gluconate) Soap before surgery.  CHG is an antiseptic cleaner which kills germs and bonds with the skin to continue killing germs even after washing.    Oral Hygiene is also important to reduce your risk of infection.  Remember - BRUSH YOUR TEETH THE MORNING OF SURGERY WITH YOUR REGULAR TOOTHPASTE  Please do not use if you have an allergy to CHG or antibacterial soaps. If your skin becomes reddened/irritated stop using the CHG.  Do not shave (including legs and underarms) for at least 48 hours prior to first CHG shower. It is OK to shave your face.  Please follow these instructions carefully.   1. Shower the NIGHT BEFORE SURGERY and the MORNING OF SURGERY with CHG Soap.   2. If you chose to wash your hair, wash your hair first as usual with your normal shampoo.  3. After you shampoo, rinse your hair and body thoroughly to remove the shampoo.  4. Use CHG as you would any other liquid soap. You can apply CHG directly to the skin and wash gently with a scrungie or a clean washcloth.   5. Apply the CHG Soap to your body ONLY FROM THE NECK DOWN.  Do not use on  open wounds or open sores. Avoid contact with your eyes, ears, mouth and genitals (private parts). Wash Face and genitals (private parts)  with your normal soap.   6. Wash thoroughly, paying special attention to the area where your surgery will be performed.  7. Thoroughly rinse your body with warm water from the neck down.  8. DO NOT shower/wash with your normal soap after using and rinsing off the CHG Soap.  9. Pat yourself dry with a CLEAN TOWEL.  10. Wear CLEAN PAJAMAS to bed the night before surgery, wear comfortable clothes the morning of surgery  11. Place CLEAN SHEETS on your bed the night of your first shower and DO NOT SLEEP WITH  PETS.    Day of Surgery:  Do not apply any deodorants/lotions.  Please wear clean clothes to the hospital/surgery center.   Remember to brush your teeth WITH YOUR REGULAR TOOTHPASTE.   Please read over the following fact sheets that you were given.

## 2019-02-21 ENCOUNTER — Ambulatory Visit (HOSPITAL_COMMUNITY)
Admission: RE | Admit: 2019-02-21 | Discharge: 2019-02-21 | Disposition: A | Discharge status: Home / Self Care | Payer: 59 | Source: Ambulatory Visit | Attending: Orthopedic Surgery | Admitting: Orthopedic Surgery

## 2019-02-21 ENCOUNTER — Encounter (HOSPITAL_COMMUNITY)
Admission: RE | Admit: 2019-02-21 | Discharge: 2019-02-21 | Disposition: A | Discharge status: Home / Self Care | Payer: Worker's Compensation | Source: Ambulatory Visit | Attending: Orthopedic Surgery | Admitting: Orthopedic Surgery

## 2019-02-21 ENCOUNTER — Other Ambulatory Visit (HOSPITAL_COMMUNITY)
Admission: RE | Admit: 2019-02-21 | Discharge: 2019-02-21 | Disposition: A | Discharge status: Home / Self Care | Payer: 59 | Source: Ambulatory Visit | Attending: Orthopedic Surgery | Admitting: Orthopedic Surgery

## 2019-02-21 ENCOUNTER — Encounter (HOSPITAL_COMMUNITY): Payer: Self-pay

## 2019-02-21 ENCOUNTER — Other Ambulatory Visit: Payer: Self-pay

## 2019-02-21 DIAGNOSIS — Z01818 Encounter for other preprocedural examination: Secondary | ICD-10-CM | POA: Insufficient documentation

## 2019-02-21 DIAGNOSIS — Z1159 Encounter for screening for other viral diseases: Secondary | ICD-10-CM | POA: Diagnosis not present

## 2019-02-21 DIAGNOSIS — M1990 Unspecified osteoarthritis, unspecified site: Secondary | ICD-10-CM | POA: Diagnosis not present

## 2019-02-21 DIAGNOSIS — M4722 Other spondylosis with radiculopathy, cervical region: Secondary | ICD-10-CM | POA: Diagnosis present

## 2019-02-21 DIAGNOSIS — K219 Gastro-esophageal reflux disease without esophagitis: Secondary | ICD-10-CM | POA: Diagnosis not present

## 2019-02-21 DIAGNOSIS — I10 Essential (primary) hypertension: Secondary | ICD-10-CM | POA: Diagnosis not present

## 2019-02-21 DIAGNOSIS — Z01812 Encounter for preprocedural laboratory examination: Secondary | ICD-10-CM | POA: Insufficient documentation

## 2019-02-21 DIAGNOSIS — K449 Diaphragmatic hernia without obstruction or gangrene: Secondary | ICD-10-CM | POA: Diagnosis not present

## 2019-02-21 LAB — CBC
HCT: 53 % — ABNORMAL HIGH (ref 39.0–52.0)
Hemoglobin: 17.8 g/dL — ABNORMAL HIGH (ref 13.0–17.0)
MCH: 32.2 pg (ref 26.0–34.0)
MCHC: 33.6 g/dL (ref 30.0–36.0)
MCV: 95.8 fL (ref 80.0–100.0)
Platelets: 164 10*3/uL (ref 150–400)
RBC: 5.53 MIL/uL (ref 4.22–5.81)
RDW: 12 % (ref 11.5–15.5)
WBC: 7.6 10*3/uL (ref 4.0–10.5)
nRBC: 0 % (ref 0.0–0.2)

## 2019-02-21 LAB — URINALYSIS, ROUTINE W REFLEX MICROSCOPIC
Bilirubin Urine: NEGATIVE
Glucose, UA: NEGATIVE mg/dL
Hgb urine dipstick: NEGATIVE
Ketones, ur: NEGATIVE mg/dL
Leukocytes,Ua: NEGATIVE
Nitrite: NEGATIVE
Protein, ur: NEGATIVE mg/dL
Specific Gravity, Urine: 1.008 (ref 1.005–1.030)
pH: 5 (ref 5.0–8.0)

## 2019-02-21 LAB — BASIC METABOLIC PANEL
Anion gap: 14 (ref 5–15)
BUN: 11 mg/dL (ref 8–23)
CO2: 20 mmol/L — ABNORMAL LOW (ref 22–32)
Calcium: 9.5 mg/dL (ref 8.9–10.3)
Chloride: 106 mmol/L (ref 98–111)
Creatinine, Ser: 0.88 mg/dL (ref 0.61–1.24)
GFR calc Af Amer: >60 mL/min (ref >60)
GFR calc non Af Amer: >60 mL/min (ref >60)
Glucose, Bld: 97 mg/dL (ref 70–99)
Potassium: 4 mmol/L (ref 3.5–5.1)
Sodium: 140 mmol/L (ref 135–145)

## 2019-02-21 LAB — SURGICAL PCR SCREEN
MRSA, PCR: NEGATIVE
Staphylococcus aureus: NEGATIVE

## 2019-02-21 LAB — APTT: aPTT: 36 seconds (ref 24–36)

## 2019-02-21 LAB — PROTIME-INR
INR: 1.1 (ref 0.8–1.2)
Prothrombin Time: 14.2 seconds (ref 11.4–15.2)

## 2019-02-21 NOTE — Progress Notes (Signed)
PCP - Vonda Antigua Cardiologist - Nasher  Chest x-ray - 02/21/19 EKG - 05/05/18 Stress Test - 2015 ECHO - 2015 Cardiac Cath-denies -   Stopped aspirin on 11/30/18 Anesthesia review: Yes, per Dr Rolena Infante  Patient denies shortness of breath, fever, cough and chest pain at PAT appointment   Patient verbalized understanding of instructions that were given to them at the PAT appointment. Patient was also instructed that they will need to review over the PAT instructions again at home before surgery.

## 2019-02-22 NOTE — Anesthesia Preprocedure Evaluation (Addendum)
Anesthesia Evaluation  Patient identified by MRN, date of birth, ID band Patient awake    Reviewed: Allergy & Precautions, NPO status , Patient's Chart, lab work & pertinent test results  History of Anesthesia Complications Negative for: history of anesthetic complications  Airway Mallampati: II  TM Distance: >3 FB Neck ROM: Limited    Dental no notable dental hx. (+) Teeth Intact, Dental Advisory Given   Pulmonary neg pulmonary ROS,    Pulmonary exam normal breath sounds clear to auscultation       Cardiovascular hypertension, Normal cardiovascular exam Rhythm:Regular Rate:Normal     Neuro/Psych negative neurological ROS  negative psych ROS   GI/Hepatic Neg liver ROS, hiatal hernia, GERD  Medicated and Controlled,H/o esophageal stricture s/p dilation   Endo/Other  negative endocrine ROS  Renal/GU negative Renal ROS  negative genitourinary   Musculoskeletal  (+) Arthritis ,   Abdominal   Peds  Hematology negative hematology ROS (+)   Anesthesia Other Findings   Reproductive/Obstetrics                          Anesthesia Physical Anesthesia Plan  ASA: II  Anesthesia Plan: General   Post-op Pain Management:    Induction: Intravenous  PONV Risk Score and Plan: 2 and Ondansetron, Dexamethasone, Midazolam and Treatment may vary due to age or medical condition  Airway Management Planned: Oral ETT and Video Laryngoscope Planned  Additional Equipment: None  Intra-op Plan:   Post-operative Plan: Extubation in OR  Informed Consent: I have reviewed the patients History and Physical, chart, labs and discussed the procedure including the risks, benefits and alternatives for the proposed anesthesia with the patient or authorized representative who has indicated his/her understanding and acceptance.     Dental advisory given  Plan Discussed with:   Anesthesia Plan Comments: (Follows  with cardiology for hx of mildly dilated ascending aorta and atypical chest pain. He had a cardiac workup in 2015 with negative lexiscan and normal echo. More recently he wore a holter monitor that did not show any significant arrhythmia. He saw Dr. Acie Fredrickson 10/22/18 and discussed possible upcoming surgery. Per Dr. Elmarie Shiley note "He needs to have shoulder surgery related to this recent accident.  He does not need cardiology clearance for any upcoming surgical procedures."  Nuclear stress 02/20/2014: Overall Impression:  Low risk stress nuclear study.  Poor exercise capacity so he was switched to [employer]. There is apical thinning seen on rest and stress images. No reversible ischemia. LV Ejection Fraction: 67%.  LV Wall Motion:  Normal Wall Motion  Echocardiogram 02/20/2014 Left ventricle: The cavity size was normal. Systolic function was normal. The estimated ejection fraction was in the range of 50% to 55%. Wall motion was normal; there were no regional wall motion abnormalities. - Left atrium: The atrium was mildly dilated. )      Anesthesia Quick Evaluation

## 2019-02-23 ENCOUNTER — Ambulatory Visit (HOSPITAL_COMMUNITY): Payer: Worker's Compensation | Admitting: Physician Assistant

## 2019-02-23 ENCOUNTER — Ambulatory Visit (HOSPITAL_COMMUNITY): Payer: Worker's Compensation

## 2019-02-23 ENCOUNTER — Ambulatory Visit: Payer: Self-pay | Admitting: Orthopedic Surgery

## 2019-02-23 ENCOUNTER — Other Ambulatory Visit: Payer: Self-pay

## 2019-02-23 ENCOUNTER — Ambulatory Visit (HOSPITAL_COMMUNITY): Payer: Worker's Compensation | Admitting: Anesthesiology

## 2019-02-23 ENCOUNTER — Encounter (HOSPITAL_COMMUNITY): Payer: Self-pay

## 2019-02-23 ENCOUNTER — Ambulatory Visit (HOSPITAL_COMMUNITY)
Admission: RE | Disposition: A | Discharge status: Home / Self Care | Payer: Self-pay | Source: Home / Self Care | Attending: Orthopedic Surgery

## 2019-02-23 ENCOUNTER — Ambulatory Visit (HOSPITAL_COMMUNITY)
Admission: RE | Admit: 2019-02-23 | Discharge: 2019-02-25 | Disposition: A | Discharge status: Home / Self Care | Payer: Worker's Compensation | Attending: Orthopedic Surgery | Admitting: Orthopedic Surgery

## 2019-02-23 DIAGNOSIS — K219 Gastro-esophageal reflux disease without esophagitis: Secondary | ICD-10-CM | POA: Diagnosis not present

## 2019-02-23 DIAGNOSIS — I10 Essential (primary) hypertension: Secondary | ICD-10-CM | POA: Insufficient documentation

## 2019-02-23 DIAGNOSIS — Z01818 Encounter for other preprocedural examination: Secondary | ICD-10-CM | POA: Insufficient documentation

## 2019-02-23 DIAGNOSIS — M4722 Other spondylosis with radiculopathy, cervical region: Secondary | ICD-10-CM | POA: Diagnosis not present

## 2019-02-23 DIAGNOSIS — M1990 Unspecified osteoarthritis, unspecified site: Secondary | ICD-10-CM | POA: Insufficient documentation

## 2019-02-23 DIAGNOSIS — K449 Diaphragmatic hernia without obstruction or gangrene: Secondary | ICD-10-CM | POA: Diagnosis not present

## 2019-02-23 DIAGNOSIS — Z419 Encounter for procedure for purposes other than remedying health state, unspecified: Secondary | ICD-10-CM

## 2019-02-23 DIAGNOSIS — M5020 Other cervical disc displacement, unspecified cervical region: Secondary | ICD-10-CM | POA: Diagnosis present

## 2019-02-23 HISTORY — PX: ANTERIOR CERVICAL DECOMP/DISCECTOMY FUSION: SHX1161

## 2019-02-23 LAB — NOVEL CORONAVIRUS, NAA (HOSP ORDER, SEND-OUT TO REF LAB; TAT 18-24 HRS): SARS-CoV-2, NAA: NOT DETECTED

## 2019-02-23 SURGERY — ANTERIOR CERVICAL DECOMPRESSION/DISCECTOMY FUSION 1 LEVEL
Anesthesia: General | Site: Spine Cervical

## 2019-02-23 MED ORDER — OXYCODONE HCL 5 MG PO TABS
ORAL_TABLET | ORAL | Status: AC
  Filled 2019-02-23: qty 3

## 2019-02-23 MED ORDER — FENTANYL CITRATE (PF) 100 MCG/2ML IJ SOLN
INTRAMUSCULAR | Status: AC
  Filled 2019-02-23: qty 2

## 2019-02-23 MED ORDER — OXYCODONE HCL 5 MG PO TABS
10.0000 mg | ORAL_TABLET | ORAL | Status: DC | PRN
  Administered 2019-02-23 – 2019-02-24 (×2): 10 mg via ORAL
  Filled 2019-02-23: qty 2

## 2019-02-23 MED ORDER — EPHEDRINE SULFATE 50 MG/ML IJ SOLN
INTRAMUSCULAR | Status: DC | PRN
  Administered 2019-02-23: 5 mg via INTRAVENOUS

## 2019-02-23 MED ORDER — ONDANSETRON HCL 4 MG/2ML IJ SOLN
4.0000 mg | Freq: Four times a day (QID) | INTRAMUSCULAR | Status: DC | PRN
  Administered 2019-02-24: 4 mg via INTRAVENOUS
  Filled 2019-02-23: qty 2

## 2019-02-23 MED ORDER — ACETAMINOPHEN 10 MG/ML IV SOLN
INTRAVENOUS | Status: AC
  Filled 2019-02-23: qty 100

## 2019-02-23 MED ORDER — THROMBIN (RECOMBINANT) 20000 UNITS EX SOLR
CUTANEOUS | Status: AC
  Filled 2019-02-23: qty 20000

## 2019-02-23 MED ORDER — LACTATED RINGERS IV SOLN
INTRAVENOUS | Status: DC
  Administered 2019-02-23 (×2): via INTRAVENOUS

## 2019-02-23 MED ORDER — FENTANYL CITRATE (PF) 250 MCG/5ML IJ SOLN
INTRAMUSCULAR | Status: AC
  Filled 2019-02-23: qty 5

## 2019-02-23 MED ORDER — OXYCODONE HCL 5 MG PO TABS: 5.0000 mg | ORAL_TABLET | ORAL | Status: DC | PRN

## 2019-02-23 MED ORDER — FENTANYL CITRATE (PF) 100 MCG/2ML IJ SOLN
INTRAMUSCULAR | Status: DC | PRN
  Administered 2019-02-23: 25 ug via INTRAVENOUS
  Administered 2019-02-23: 100 ug via INTRAVENOUS
  Administered 2019-02-23: 50 ug via INTRAVENOUS

## 2019-02-23 MED ORDER — PROPOFOL 10 MG/ML IV BOLUS
INTRAVENOUS | Status: DC | PRN
  Administered 2019-02-23: 200 mg via INTRAVENOUS
  Administered 2019-02-23: 20 mg via INTRAVENOUS

## 2019-02-23 MED ORDER — CEFAZOLIN SODIUM-DEXTROSE 2-4 GM/100ML-% IV SOLN
2.0000 g | Freq: Three times a day (TID) | INTRAVENOUS | Status: AC
  Administered 2019-02-23 – 2019-02-24 (×2): 2 g via INTRAVENOUS
  Filled 2019-02-23 (×2): qty 100

## 2019-02-23 MED ORDER — SODIUM CHLORIDE 0.9% FLUSH
3.0000 mL | Freq: Two times a day (BID) | INTRAVENOUS | Status: DC
  Administered 2019-02-23 – 2019-02-25 (×2): 3 mL via INTRAVENOUS

## 2019-02-23 MED ORDER — BUPIVACAINE-EPINEPHRINE 0.25% -1:200000 IJ SOLN
INTRAMUSCULAR | Status: DC | PRN
  Administered 2019-02-23: 8 mL

## 2019-02-23 MED ORDER — MENTHOL 3 MG MT LOZG: 1.0000 | LOZENGE | OROMUCOSAL | Status: DC | PRN

## 2019-02-23 MED ORDER — METHOCARBAMOL 500 MG PO TABS
ORAL_TABLET | ORAL | Status: AC
  Filled 2019-02-23: qty 1

## 2019-02-23 MED ORDER — DEXAMETHASONE SODIUM PHOSPHATE 10 MG/ML IJ SOLN
INTRAMUSCULAR | Status: DC | PRN
  Administered 2019-02-23: 10 mg via INTRAVENOUS

## 2019-02-23 MED ORDER — ONDANSETRON HCL 4 MG PO TABS: 4.0000 mg | ORAL_TABLET | Freq: Three times a day (TID) | ORAL | 0 refills | Status: DC | PRN

## 2019-02-23 MED ORDER — GABAPENTIN 300 MG PO CAPS
300.0000 mg | ORAL_CAPSULE | Freq: Three times a day (TID) | ORAL | Status: DC
  Administered 2019-02-23 – 2019-02-25 (×5): 300 mg via ORAL
  Filled 2019-02-23 (×5): qty 1

## 2019-02-23 MED ORDER — PROPOFOL 10 MG/ML IV BOLUS
INTRAVENOUS | Status: AC
  Filled 2019-02-23: qty 20

## 2019-02-23 MED ORDER — SODIUM CHLORIDE 0.9 % IV SOLN
250.0000 mL | INTRAVENOUS | Status: DC
  Administered 2019-02-24: 250 mL via INTRAVENOUS

## 2019-02-23 MED ORDER — ACETAMINOPHEN 325 MG PO TABS
650.0000 mg | ORAL_TABLET | ORAL | Status: DC | PRN
  Administered 2019-02-23 – 2019-02-25 (×4): 650 mg via ORAL
  Filled 2019-02-23 (×5): qty 2

## 2019-02-23 MED ORDER — OXYCODONE HCL 5 MG PO TABS
5.0000 mg | ORAL_TABLET | Freq: Once | ORAL | Status: AC | PRN
  Administered 2019-02-23: 5 mg via ORAL

## 2019-02-23 MED ORDER — ACETAMINOPHEN 10 MG/ML IV SOLN
1000.0000 mg | Freq: Once | INTRAVENOUS | Status: AC
  Administered 2019-02-23: 1000 mg via INTRAVENOUS
  Filled 2019-02-23: qty 100

## 2019-02-23 MED ORDER — ROCURONIUM BROMIDE 10 MG/ML (PF) SYRINGE
PREFILLED_SYRINGE | INTRAVENOUS | Status: AC
  Filled 2019-02-23: qty 10

## 2019-02-23 MED ORDER — LIDOCAINE 2% (20 MG/ML) 5 ML SYRINGE
INTRAMUSCULAR | Status: AC
  Filled 2019-02-23: qty 5

## 2019-02-23 MED ORDER — ROCURONIUM BROMIDE 10 MG/ML (PF) SYRINGE
PREFILLED_SYRINGE | INTRAVENOUS | Status: DC | PRN
  Administered 2019-02-23: 50 mg via INTRAVENOUS
  Administered 2019-02-23: 20 mg via INTRAVENOUS
  Administered 2019-02-23 (×2): 10 mg via INTRAVENOUS

## 2019-02-23 MED ORDER — METHOCARBAMOL 500 MG PO TABS
500.0000 mg | ORAL_TABLET | Freq: Four times a day (QID) | ORAL | Status: DC | PRN
  Administered 2019-02-23 – 2019-02-24 (×3): 500 mg via ORAL
  Filled 2019-02-23 (×3): qty 1

## 2019-02-23 MED ORDER — MIDAZOLAM HCL 5 MG/5ML IJ SOLN
INTRAMUSCULAR | Status: DC | PRN
  Administered 2019-02-23: 2 mg via INTRAVENOUS

## 2019-02-23 MED ORDER — METHOCARBAMOL 500 MG PO TABS: 500.0000 mg | ORAL_TABLET | Freq: Three times a day (TID) | ORAL | 0 refills | Status: AC | PRN

## 2019-02-23 MED ORDER — MIDAZOLAM HCL 2 MG/2ML IJ SOLN
INTRAMUSCULAR | Status: AC
  Filled 2019-02-23: qty 2

## 2019-02-23 MED ORDER — POLYETHYLENE GLYCOL 3350 17 G PO PACK
17.0000 g | PACK | Freq: Every day | ORAL | Status: DC | PRN
  Administered 2019-02-24: 17 g via ORAL
  Filled 2019-02-23: qty 1

## 2019-02-23 MED ORDER — THROMBIN 20000 UNITS EX SOLR
CUTANEOUS | Status: DC | PRN
  Administered 2019-02-23: 20 mL

## 2019-02-23 MED ORDER — FENTANYL CITRATE (PF) 100 MCG/2ML IJ SOLN
25.0000 ug | INTRAMUSCULAR | Status: DC | PRN
  Administered 2019-02-23 (×2): 50 ug via INTRAVENOUS

## 2019-02-23 MED ORDER — CEFAZOLIN SODIUM-DEXTROSE 2-4 GM/100ML-% IV SOLN
2.0000 g | INTRAVENOUS | Status: AC
  Administered 2019-02-23: 2 g via INTRAVENOUS

## 2019-02-23 MED ORDER — LIDOCAINE 2% (20 MG/ML) 5 ML SYRINGE
INTRAMUSCULAR | Status: DC | PRN
  Administered 2019-02-23: 100 mg via INTRAVENOUS

## 2019-02-23 MED ORDER — OXYCODONE-ACETAMINOPHEN 10-325 MG PO TABS: 1.0000 | ORAL_TABLET | Freq: Four times a day (QID) | ORAL | 0 refills | Status: AC | PRN

## 2019-02-23 MED ORDER — PHENOL 1.4 % MT LIQD: 1.0000 | OROMUCOSAL | Status: DC | PRN

## 2019-02-23 MED ORDER — ONDANSETRON HCL 4 MG PO TABS: 4.0000 mg | ORAL_TABLET | Freq: Four times a day (QID) | ORAL | Status: DC | PRN

## 2019-02-23 MED ORDER — ACETAMINOPHEN 650 MG RE SUPP: 650.0000 mg | RECTAL | Status: DC | PRN

## 2019-02-23 MED ORDER — ONDANSETRON HCL 4 MG/2ML IJ SOLN
INTRAMUSCULAR | Status: DC | PRN
  Administered 2019-02-23: 4 mg via INTRAVENOUS

## 2019-02-23 MED ORDER — SUGAMMADEX SODIUM 200 MG/2ML IV SOLN
INTRAVENOUS | Status: DC | PRN
  Administered 2019-02-23: 200 mg via INTRAVENOUS

## 2019-02-23 MED ORDER — LACTATED RINGERS IV SOLN
INTRAVENOUS | Status: DC
  Administered 2019-02-23 (×2): via INTRAVENOUS

## 2019-02-23 MED ORDER — METHOCARBAMOL 1000 MG/10ML IJ SOLN
500.0000 mg | Freq: Four times a day (QID) | INTRAVENOUS | Status: DC | PRN
  Filled 2019-02-23: qty 5

## 2019-02-23 MED ORDER — BUPIVACAINE-EPINEPHRINE (PF) 0.25% -1:200000 IJ SOLN
INTRAMUSCULAR | Status: AC
  Filled 2019-02-23: qty 30

## 2019-02-23 MED ORDER — SUCCINYLCHOLINE CHLORIDE 200 MG/10ML IV SOSY
PREFILLED_SYRINGE | INTRAVENOUS | Status: DC | PRN
  Administered 2019-02-23: 100 mg via INTRAVENOUS

## 2019-02-23 MED ORDER — ONDANSETRON HCL 4 MG/2ML IJ SOLN: 4.0000 mg | Freq: Once | INTRAMUSCULAR | Status: DC | PRN

## 2019-02-23 MED ORDER — SODIUM CHLORIDE 0.9% FLUSH: 3.0000 mL | INTRAVENOUS | Status: DC | PRN

## 2019-02-23 MED ORDER — 0.9 % SODIUM CHLORIDE (POUR BTL) OPTIME
TOPICAL | Status: DC | PRN
  Administered 2019-02-23: 12:00:00 1000 mL

## 2019-02-23 MED ORDER — HEMOSTATIC AGENTS (NO CHARGE) OPTIME
TOPICAL | Status: DC | PRN
  Administered 2019-02-23: 1

## 2019-02-23 MED ORDER — OXYCODONE HCL 5 MG/5ML PO SOLN: 5.0000 mg | Freq: Once | ORAL | Status: AC | PRN

## 2019-02-23 SURGICAL SUPPLY — 63 items
BLADE CLIPPER SURG (BLADE) ×3 IMPLANT
BONE VIVIGEN FORMABLE 1.3CC (Bone Implant) ×3 IMPLANT
CABLE BIPOLOR RESECTION CORD (MISCELLANEOUS) ×3 IMPLANT
CANISTER SUCT 3000ML PPV (MISCELLANEOUS) ×3 IMPLANT
CLOSURE STERI-STRIP 1/2X4 (GAUZE/BANDAGES/DRESSINGS) ×1
CLSR STERI-STRIP ANTIMIC 1/2X4 (GAUZE/BANDAGES/DRESSINGS) ×2 IMPLANT
COVER MAYO STAND STRL (DRAPES) ×9 IMPLANT
COVER SURGICAL LIGHT HANDLE (MISCELLANEOUS) ×3 IMPLANT
COVER WAND RF STERILE (DRAPES) IMPLANT
CRADLE DONUT ADULT HEAD (MISCELLANEOUS) ×3 IMPLANT
DEVICE ENDSKLTN TC IMPLANT 8MM (Spacer) ×1 IMPLANT
DRAPE C-ARM 42X72 X-RAY (DRAPES) ×3 IMPLANT
DRAPE POUCH INSTRU U-SHP 10X18 (DRAPES) ×3 IMPLANT
DRAPE SURG 17X23 STRL (DRAPES) ×3 IMPLANT
DRAPE U-SHAPE 47X51 STRL (DRAPES) ×3 IMPLANT
DRSG OPSITE POSTOP 3X4 (GAUZE/BANDAGES/DRESSINGS) ×3 IMPLANT
DURAPREP 26ML APPLICATOR (WOUND CARE) ×3 IMPLANT
ELECT COATED BLADE 2.86 ST (ELECTRODE) ×3 IMPLANT
ELECT PENCIL ROCKER SW 15FT (MISCELLANEOUS) ×3 IMPLANT
ELECT REM PT RETURN 9FT ADLT (ELECTROSURGICAL) ×3
ELECTRODE REM PT RTRN 9FT ADLT (ELECTROSURGICAL) ×1 IMPLANT
ENDOSKELETON TC IMPLANT 8MM (Spacer) ×3 IMPLANT
GLOVE BIO SURGEON STRL SZ 6.5 (GLOVE) ×2 IMPLANT
GLOVE BIO SURGEONS STRL SZ 6.5 (GLOVE) ×1
GLOVE BIOGEL PI IND STRL 6.5 (GLOVE) ×1 IMPLANT
GLOVE BIOGEL PI IND STRL 8.5 (GLOVE) ×1 IMPLANT
GLOVE BIOGEL PI INDICATOR 6.5 (GLOVE) ×2
GLOVE BIOGEL PI INDICATOR 8.5 (GLOVE) ×2
GLOVE SS BIOGEL STRL SZ 8.5 (GLOVE) ×1 IMPLANT
GLOVE SUPERSENSE BIOGEL SZ 8.5 (GLOVE) ×2
GOWN STRL REUS W/ TWL LRG LVL3 (GOWN DISPOSABLE) ×2 IMPLANT
GOWN STRL REUS W/TWL 2XL LVL3 (GOWN DISPOSABLE) ×3 IMPLANT
GOWN STRL REUS W/TWL LRG LVL3 (GOWN DISPOSABLE) ×4
KIT BASIN OR (CUSTOM PROCEDURE TRAY) ×3 IMPLANT
KIT TURNOVER KIT B (KITS) ×3 IMPLANT
NEEDLE HYPO 22GX1.5 SAFETY (NEEDLE) ×3 IMPLANT
NEEDLE SPNL 18GX3.5 QUINCKE PK (NEEDLE) ×3 IMPLANT
NS IRRIG 1000ML POUR BTL (IV SOLUTION) ×3 IMPLANT
PACK ORTHO CERVICAL (CUSTOM PROCEDURE TRAY) ×3 IMPLANT
PACK UNIVERSAL I (CUSTOM PROCEDURE TRAY) ×3 IMPLANT
PAD ARMBOARD 7.5X6 YLW CONV (MISCELLANEOUS) ×6 IMPLANT
PATTIES SURGICAL .25X.25 (GAUZE/BANDAGES/DRESSINGS) IMPLANT
PIN DISTRACTION 14 (PIN) ×6 IMPLANT
PLATE ONE LEVEL SKYLINE 14MM (Plate) ×3 IMPLANT
PUTTY DBX 1CC (Putty) ×3 IMPLANT
PUTTY DBX 1CC DEPUY (Putty) ×1 IMPLANT
RESTRAINT LIMB HOLDER UNIV (RESTRAINTS) ×3 IMPLANT
SCREW SKYLINE 14MM SD-VA (Screw) ×6 IMPLANT
SCREW SKYLINE 16MM (Screw) ×6 IMPLANT
SPONGE INTESTINAL PEANUT (DISPOSABLE) ×3 IMPLANT
SPONGE SURGIFOAM ABS GEL SZ50 (HEMOSTASIS) ×3 IMPLANT
SURGIFLO W/THROMBIN 8M KIT (HEMOSTASIS) ×3 IMPLANT
SUT BONE WAX W31G (SUTURE) ×3 IMPLANT
SUT MON AB 3-0 SH 27 (SUTURE) ×2
SUT MON AB 3-0 SH27 (SUTURE) ×1 IMPLANT
SUT VIC AB 2-0 CT1 18 (SUTURE) ×3 IMPLANT
SYR BULB IRRIGATION 50ML (SYRINGE) ×3 IMPLANT
SYR CONTROL 10ML LL (SYRINGE) ×3 IMPLANT
TAPE CLOTH 4X10 WHT NS (GAUZE/BANDAGES/DRESSINGS) ×3 IMPLANT
TAPE UMBILICAL COTTON 1/8X30 (MISCELLANEOUS) ×3 IMPLANT
TOWEL GREEN STERILE (TOWEL DISPOSABLE) ×3 IMPLANT
TOWEL GREEN STERILE FF (TOWEL DISPOSABLE) ×3 IMPLANT
WATER STERILE IRR 1000ML POUR (IV SOLUTION) ×3 IMPLANT

## 2019-02-23 NOTE — H&P (Signed)
Updated H&P  No significant change to his clinical exam or history as of his last office visit of 02/15/2019.  Continues to have significant neck and radicular left arm pain.  Continues to have 4/5 motor strength in the left tricep as well as numbness and dysesthesias in the left C7 dermatome.  Positive Spurling sign with reproduction of left C7 radicular pain.  As a result of the failure of conservative management to improve his quality of life we are moving forward with a single level ACDF to address his radicular C7 pain and weakness.  Again gone over all appropriate risks benefits and alternatives to surgery and all of his questions were addressed.

## 2019-02-23 NOTE — Discharge Instructions (Signed)

## 2019-02-23 NOTE — Anesthesia Procedure Notes (Signed)
Procedure Name: Intubation Date/Time: 02/23/2019 11:45 AM Performed by: Jenne Campus, CRNA Pre-anesthesia Checklist: Patient identified, Emergency Drugs available, Suction available and Patient being monitored Patient Re-evaluated:Patient Re-evaluated prior to induction Oxygen Delivery Method: Circle System Utilized Preoxygenation: Pre-oxygenation with 100% oxygen Induction Type: IV induction Laryngoscope Size: Glidescope and 4 Grade View: Grade I Tube type: Oral Tube size: 7.5 mm Number of attempts: 1 Airway Equipment and Method: Stylet and Video-laryngoscopy Placement Confirmation: ETT inserted through vocal cords under direct vision,  positive ETCO2 and breath sounds checked- equal and bilateral Secured at: 22 cm Tube secured with: Tape Dental Injury: Teeth and Oropharynx as per pre-operative assessment

## 2019-02-23 NOTE — Plan of Care (Signed)
  Problem: Education: Goal: Knowledge of General Education information will improve Description: Including pain rating scale, medication(s)/side effects and non-pharmacologic comfort measures Outcome: Progressing   Problem: Pain Managment: Goal: General experience of comfort will improve Outcome: Progressing   Problem: Safety: Goal: Ability to remain free from injury will improve Outcome: Progressing   

## 2019-02-23 NOTE — Op Note (Signed)
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  8 mm large lordotic  Depey anterior cervical skyline plate 14 mm length.  Allograft: vivogen  Indications: This is a very pleasant 68 year old gentleman is having significant neck and radicular left arm pain.  Attempts at conservative management had failed to alleviate his symptoms.  Patient did get some temporary relief with a C7 selective nerve root block.  Patient's clinical complaints of numbness and dysesthesias in the C7 dermatome and he does have on imaging studies foraminal stenosis with irritation and compression of the C7 exiting nerve root.  As a result of the failure to improve with operative measures we elected to proceed with surgery.  All appropriate risks benefits and alternatives were discussed with the patient and consent was obtained.  Operative report:Operative report: Patient was brought the operating room placed upon the operating room table.  After successful induction of general anesthesia and endotracheal ovation teds SCDs were applied.  The arms were tucked at the side and the anterior cervical spine was prepped and draped in a standard fashion.  Timeout was taken to confirm patient procedure and all other important data.  Intraoperative fluoroscopy was used to identify the C6/7 disc space.  The incision site was marked out and then infiltrated with quarter percent Marcaine with epinephrine.  Transverse incision was made and sharp dissection was carried out down to the platysma.  The platysma was sharply incised and I continued to perform a standard Smith-Robinson approach to the anterior cervical spine.  I dissected along the medial border the  sternocleidomastoid into the deep cervical fascia.  Identified and mobilized the omohyoid muscle and continued my dissection down to the medial aspect of the carotid sheath.  I was able to now bluntly dissect and sweep the esophagus and trachea to the right palpate and protect the carotid sheath with laterally.  The L retractor was then placed and using Kitner dissectors I was able to mobilize the remaining prevertebral fascia and expose the anterior longitudinal ligament.  A needle was placed into the C6/7 disc space and intraoperative lateral fluoroscopy view was taken to confirm the appropriate level.  Once confirmed I marked the disc space with Bovie and then began mobilizing the longus coli muscles from the superior aspect of C6to the inferior aspect of C7  The anterior traction spurs that had developed over the disc space were then removed with a [employer].  I then placed the Caspar retracting blades deflated the endotracheal cuff and expanded the retractor blades to the appropriate width.  Endotracheal cuff was then reinflated and I continued with the surgery.  Annulotomy was performed with a 15 blade scalpel and then using pituitary rongeurs I resected the bulk of the disc material.  Distraction pins were placed into the body of C6and C7 and I gently distracted the disc space with a lamina spreader.  I maintained the distraction with the distraction pin set.  Using a 2 mm Kerrison Roger I remove the overhanging osteophyte from the inferior aspect of the C6vertebral body.  Using curettes I removed all the remaining disc material.  At this point I could visualize the posterior annulus.  Using my 1 mm Kerrison rongeurs I trimmed trim down the posterior osteophyte from the body of C6 and 76.  I then used my fine nerve hook to dissect through the posterior annulus and developed a plane and resected with a 1 mm Kerrison Roger.  I then resected the PLL in a similar fashion.  At this point I was able to get  underneath the uncovertebral joint with my 1 mm Kerrison Roger and set the osteophyte to allow for more generous foraminotomies.  Once I had this complete I then rasped the endplates to ensure I had bleeding subchondral bone.  Using the trial implants I sized the intervertebral space.  I then obtained the 8 mm large lordotic cage packed with the appropriate allograft and malleted to the appropriate depth.  Distraction pins were removed and I placed bone wax in the screw holes to provide hemostasis  The 16 mm anterior cervical plate was then affixed to the body of C6 with the length self drilling screws, and 14 mm length self drilling screws into the bodies C7.  All screws had excellent purchase.  Once all screws were tightened down I then placed the final locking device to lock the screws in place.  I then remove the self retracting blades and irrigated the wound copiously normal saline.  Using FloSeal and bipolar I was able to obtain and maintain hemostasis.  After final irrigation I returned the trachea esophagus to midline and then closed the platysma with interrupted 2-0 Vicryl suture and the skin with a 3-0 Monocryl.  Steri-Strips and an Designer, multimedia were then applied.  X-rays were taken and they were satisfactory both the AP and lateral planes.  The hardware was properly positioned and there was adequate restoration of the foraminal volume.  Patient was ultimately extubated and transferred the PACU without incident.  He had of the case all needle sponge counts were correct.  There were no adverse intraoperative events.

## 2019-02-23 NOTE — Transfer of Care (Signed)
Immediate Anesthesia Transfer of Care Note  Patient: Kyle Davidson  Procedure(s) Performed: ANTERIOR CERVICAL DECOMPRESSION/DISCECTOMY FUSION C6-7 (N/A Spine Cervical)  Patient Location: PACU  Anesthesia Type:General  Level of Consciousness: oriented, drowsy and patient cooperative  Airway & Oxygen Therapy: Patient Spontanous Breathing and Patient connected to nasal cannula oxygen  Post-op Assessment: Report given to RN and Post -op Vital signs reviewed and stable  Post vital signs: Reviewed  Last Vitals:  Vitals Value Taken Time  BP 154/85 02/23/2019  2:00 PM  Temp    Pulse 73 02/23/2019  2:03 PM  Resp 14 02/23/2019  2:03 PM  SpO2 99 % 02/23/2019  2:03 PM  Vitals shown include unvalidated device data.  Last Pain:  Vitals:   02/23/19 0946  TempSrc:   PainSc: 8       Patients Stated Pain Goal: 2 (76/18/48 5927)  Complications: No apparent anesthesia complications

## 2019-02-23 NOTE — Brief Op Note (Signed)
02/23/2019  1:50 PM  PATIENT:  Kyle Davidson  68 y.o. 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  POST-OPERATIVE DIAGNOSIS:  C6-7  spondylotic radiculopathy  PROCEDURE:  Procedure(s) with comments: ANTERIOR CERVICAL DECOMPRESSION/DISCECTOMY FUSION C6-7 (N/A) - 3 hrs  SURGEON:  Surgeon(s) and Role:    Melina Schools, MD - Primary  PHYSICIAN ASSISTANT:   ASSISTANTS: Amanda Ward, PA   ANESTHESIA:   general  EBL:  50 mL   BLOOD ADMINISTERED:none  DRAINS: none   LOCAL MEDICATIONS USED:  MARCAINE     SPECIMEN:  No Specimen  DISPOSITION OF SPECIMEN:  N/A  COUNTS:  YES  TOURNIQUET:  * No tourniquets in log *  DICTATION: .Dragon Dictation  PLAN OF CARE: Admit for overnight observation  PATIENT DISPOSITION:  PACU - hemodynamically stable.

## 2019-02-23 NOTE — Anesthesia Postprocedure Evaluation (Signed)
Anesthesia Post Note  Patient: Kyle Davidson  Procedure(s) Performed: ANTERIOR CERVICAL DECOMPRESSION/DISCECTOMY FUSION C6-7 (N/A Spine Cervical)     Patient location during evaluation: PACU Anesthesia Type: General Level of consciousness: awake and alert Pain management: pain level controlled Vital Signs Assessment: post-procedure vital signs reviewed and stable Respiratory status: spontaneous breathing, nonlabored ventilation and respiratory function stable Cardiovascular status: blood pressure returned to baseline and stable Postop Assessment: no apparent nausea or vomiting Anesthetic complications: no    Last Vitals:  Vitals:   02/23/19 1445 02/23/19 1500  BP: (!) 149/74 (!) 143/76  Pulse: 65 65  Resp: 17 16  Temp:    SpO2: 98% 99%    Last Pain:  Vitals:   02/23/19 1445  TempSrc:   PainSc: Asleep                 Lidia Collum

## 2019-02-24 ENCOUNTER — Encounter (HOSPITAL_COMMUNITY): Payer: Self-pay | Admitting: Orthopedic Surgery

## 2019-02-24 DIAGNOSIS — M4722 Other spondylosis with radiculopathy, cervical region: Secondary | ICD-10-CM | POA: Diagnosis not present

## 2019-02-24 NOTE — Evaluation (Signed)
Physical Therapy Evaluation Patient Details Name: Kyle Davidson MRN: [id number] DOB: 1951/04/18 Today's Date: 02/24/2019   History of Present Illness  Pt is a 68 y.o. male with radicular C7 pain and weakness, s/p ACDF C6-7 on 02/23/19. PMH includes cervical vertebral fx, arthritis.    Clinical Impression  Pt presents with an overall decrease in functional mobility secondary to above. PTA, pt lives with wife, is independent with mobility although limited by LUE and BLE weakness since initial injury. Educ on cervical precautions, brace application, positioning, and importance of mobility. Today, pt able to initiate gait training; stability much improved with use of RW. Pt would benefit from continued acute PT services to maximize functional mobility and independence prior to d/c home.     Follow Up Recommendations No PT follow up;Supervision for mobility/OOB    Equipment Recommendations  Rolling walker with 5" wheels    Recommendations for Other Services       Precautions / Restrictions Precautions Precautions: Sternal;Cervical Precaution Comments: Verbally reviewed precautions Required Braces or Orthoses: Cervical Brace Cervical Brace: Hard collar Restrictions Weight Bearing Restrictions: No      Mobility  Bed Mobility Overal bed mobility: Modified Independent             General bed mobility comments: HOB elevated  Transfers Overall transfer level: Needs assistance Equipment used: None Transfers: Sit to/from Stand Sit to Stand: Supervision         General transfer comment: Able to stand well with hands on knees, no physical assist required  Ambulation/Gait Ambulation/Gait assistance: Min guard;Supervision Gait Distance (Feet): 100 Feet Assistive device: None;Rolling walker (2 wheeled) Gait Pattern/deviations: Step-through pattern;Decreased stride length Gait velocity: Decreased Gait velocity interpretation: <1.8 ft/sec, indicate of risk for recurrent  falls General Gait Details: Slow, mildly unsteady gait without DME and min guard for balance. Pt with improved stability and confidence using RW, with increased step length; supervision for safety with RW  Stairs            Wheelchair Mobility    Modified Rankin (Stroke Patients Only)       Balance Overall balance assessment: Needs assistance   Sitting balance-Leahy Scale: Good       Standing balance-Leahy Scale: Fair Standing balance comment: Can ambulate without UE support and min guard                             Pertinent Vitals/Pain Pain Assessment: Faces Faces Pain Scale: Hurts little more Pain Location: Neck, L shoulder Pain Descriptors / Indicators: Guarding;Discomfort Pain Intervention(s): Monitored during session    Home Living Family/patient expects to be discharged to:: Private residence Living Arrangements: Spouse/significant other Available Help at Discharge: Family;Available 24 hours/day Type of Home: House Home Access: Stairs to enter Entrance Stairs-Rails: None Entrance Stairs-Number of Steps: 2 Home Layout: One level 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: Electronics engineer Comments: Lives with wife; multiple young grandkids who visit often    Prior Function Level of Independence: Independent         Comments: Independent with mobility, although limited by BLE weakness and LUE pain/weakness     Hand Dominance        Extremity/Trunk Assessment   Upper Extremity Assessment Upper Extremity Assessment: LUE deficits/detail;Defer to OT evaluation    Lower Extremity Assessment Lower Extremity Assessment: RLE deficits/detail;LLE deficits/detail RLE Deficits / Details: R hip flex 4/5, knee ext 4/5, knee flex 4/5 RLE Coordination: decreased gross motor LLE Deficits / Details: L  hip flex 3/5, knee ext 3/5, knee flex 3/5 LLE Coordination: decreased gross motor       Communication   Communication: No difficulties  Cognition Arousal/Alertness:  Awake/alert Behavior During Therapy: WFL for tasks assessed/performed Overall Cognitive Status: Within Functional Limits for tasks assessed                                 General Comments: Tangential with speech/poor attention(?), but able to be redirected; slight flat affect. Likely baseline cognition      General Comments General comments (skin integrity, edema, etc.): Pt only wants DME if covered by workers comp; facetime with wife to measure [facility] which wife reports will not fit over toilet, but they do have toilet riser    Exercises     Assessment/Plan    PT Assessment Patient needs continued PT services  PT Problem List Decreased strength;Decreased range of motion;Decreased activity tolerance;Decreased balance;Decreased mobility;Decreased knowledge of use of DME;Decreased knowledge of precautions;Pain       PT Treatment Interventions DME instruction;Gait training;Stair training;Functional mobility training;Therapeutic activities;Therapeutic exercise;Balance training;Patient/family education    PT Goals (Current goals can be found in the Care Plan section)  Acute Rehab PT Goals Patient Stated Goal: Decreased pain; hopeful for d/c home today PT Goal Formulation: With patient Time For Goal Achievement: 03/10/19 Potential to Achieve Goals: Good    Frequency 7X/week   Barriers to discharge        Co-evaluation               AM-PAC PT "6 Clicks" Mobility  Outcome Measure Help needed turning from your back to your side while in a flat bed without using bedrails?: None Help needed moving from lying on your back to sitting on the side of a flat bed without using bedrails?: None Help needed moving to and from a bed to a chair (including a wheelchair)?: A Little Help needed standing up from a chair using your arms (e.g., wheelchair or bedside chair)?: A Little Help needed to walk in hospital room?: A Little Help needed climbing 3-5 steps with a railing? : A  Little 6 Click Score: 20    End of Session Equipment Utilized During Treatment: Gait belt;Cervical collar Activity Tolerance: Patient tolerated treatment well;Patient limited by fatigue Patient left: in chair;with call bell/phone within reach Nurse Communication: Mobility status PT Visit Diagnosis: Other abnormalities of gait and mobility (R26.89);Pain Pain - Right/Left: Left Pain - part of body: Shoulder(neck)    Time: 1117-3567 PT Time Calculation (min) (ACUTE ONLY): 22 min   Charges:   PT Evaluation $PT Eval Moderate Complexity: 1 Mod        Mabeline Caras, PT, DPT Acute Rehabilitation Services  Pager [phone] Office [place] 02/24/2019, 9:26 AM

## 2019-02-24 NOTE — Progress Notes (Signed)
    Subjective: Procedure(s) (LRB): ANTERIOR CERVICAL DECOMPRESSION/DISCECTOMY FUSION C6-7 (N/A) 1 Day Post-Op  Patient reports pain as 4 on 0-10 scale.  Reports unchanged arm pain reports incisional neck pain   Positive void Negative bowel movement Positive flatus Negative chest pain or shortness of breath  Objective: Vital signs in last 24 hours: Temp:  [97.5 F (36.4 C)-98 F (36.7 C)] 97.7 F (36.5 C) (06/04 0518) Pulse Rate:  [64-90] 74 (06/04 0518) Resp:  [6-31] 18 (06/04 0518) BP: (128-162)/(64-88) 141/65 (06/04 0518) SpO2:  [94 %-99 %] 95 % (06/04 0518)  Intake/Output from previous day: 06/03 0701 - 06/04 0700 In: 1992.1 [I.V.:1892.1; IV Piggyback:100] Out: 50 [Blood:50]  Labs: No results for input(s): WBC, RBC, HCT, PLT in the last 72 hours. No results for input(s): NA, K, CL, CO2, BUN, CREATININE, GLUCOSE, CALCIUM in the last 72 hours. No results for input(s): LABPT, INR in the last 72 hours.  Physical Exam: ABD soft Intact pulses distally Incision: dressing C/D/I Compartment soft LUE: difficult to eval motor exam due to pain.  No focal deficits except fior dysathesias.   Body mass index is 29.29 kg/m.  Assessment/Plan: Patient stable  xrays n/a  Mobilization with physical therapy Encourage incentive spirometry Continue care  Advance diet Up with therapy  Patient reports ongoing pain.  Expressed concern about going home - so will plan on d/c tomorrow Continue ambulation   Melina Schools, MD [facility] [phone]
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
## 2019-02-24 NOTE — Plan of Care (Signed)

## 2019-02-24 NOTE — TOC Initial Note (Signed)
Transition of Care [facility]) - Initial/Assessment Note    Patient Details  Name: Kyle Davidson MRN: [id number] Date of Birth: May 11, 1951  Transition of Care [facility]) CM/SW Contact:    Ninfa Meeker, RN Phone Number: [phone] (working remotely) 02/24/2019, 10:45 AM  Clinical Narrative:  68 yr old gentleman s/p C6-7 ACDF. Case manager spoke with patient concerning discharge needs. He is under worker's comp. Will only need a RW. CM spoke with his nhurse case manager, Bary Richard [phone], will fax order to her for [place]  At [phone]. She will speak with adjuster concerning getting Walker delivered to hospital. Patient says he does not have room for a 3in1 in bathroom. He will have support from his wife at discharge. Patient has c/o pain and nausea and requested another night. CM updated worker's comp CM and  It is understood that this is related to patient having had surgery, reassured him that he will not have a bill for this. MD is aware.                Expected Discharge Plan: Home/Self Care Barriers to Discharge: No Barriers Identified   Patient Goals and CMS Choice        Expected Discharge Plan and Services Expected Discharge Plan: Home/Self Care   Discharge Planning Services: CM Consult Post Acute Care Choice: Durable Medical Equipment Living arrangements for the past 2 months: Single Family Home                 DME Arranged: Walker rolling DME Agency: (worker's comp to assign)       HH Arranged: NA          Prior Living Arrangements/Services Living arrangements for the past 2 months: Single Family Home Lives with:: Spouse          Need for Family Participation in Patient Care: No (Comment) Care giver support system in place?: Yes (comment)   Criminal Activity/Legal Involvement Pertinent to Current Situation/Hospitalization: No - Comment as needed  Activities of Daily Living Home Assistive Devices/Equipment: Eyeglasses ADL Screening (condition at  time of admission) Patient's cognitive ability adequate to safely complete daily activities?: Yes Is the patient deaf or have difficulty hearing?: No Does the patient have difficulty seeing, even when wearing glasses/contacts?: No Does the patient have difficulty concentrating, remembering, or making decisions?: No Patient able to express need for assistance with ADLs?: Yes Does the patient have difficulty dressing or bathing?: No Independently performs ADLs?: Yes (appropriate for developmental age) Does the patient have difficulty walking or climbing stairs?: No Weakness of Legs: None Weakness of Arms/Hands: None  Permission Sought/Granted                  Emotional Assessment     Affect (typically observed): Accepting Orientation: : Oriented to Self, Oriented to Situation, Oriented to Place, Oriented to  Time Alcohol / Substance Use: Not Applicable    Admission diagnosis:  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 Cervical disc herniation 02/23/2019  . Ascending aorta dilatation (HCC) 10/25/2018  . Appendicolith 04/03/2014  . Dyspnea on exertion 02/01/2014  . Chest pain 02/01/2014  . Essential hypertension 02/01/2014  . Hyperlipidemia 02/01/2014  . Post-op pain 08/22/2013  . Bilateral inguinal hernia 07/11/2013  . Umbilical hernia 28/31/5176  . Unspecified constipation 07/11/2013  . Angioedema of lips 12/26/2011  . GERD (gastroesophageal reflux disease) 12/26/2011  . Dysphagia 12/26/2011  . Sinusitis 12/26/2011  . Elevated BP 12/26/2011  .  Shoulder pain 12/26/2011   PCP:  Heywood Bene, PA-C Pharmacy:   [id number] - [place], [place] - [address] [place] [address] Phone: [phone] Fax: [phone]     Social Determinants of Health (SDOH) Interventions    Readmission Risk Interventions No flowsheet data found.

## 2019-02-25 DIAGNOSIS — M4722 Other spondylosis with radiculopathy, cervical region: Secondary | ICD-10-CM | POA: Diagnosis not present

## 2019-02-25 MED FILL — Thrombin (Recombinant) For Soln 20000 Unit: CUTANEOUS | Qty: 1 | Status: AC

## 2019-02-25 NOTE — Progress Notes (Signed)
Physical Therapy Treatment Patient Details Name: Kyle Davidson MRN: [id number] DOB: 02/02/1951 Today's Date: 02/25/2019    History of Present Illness Pt is a 68 y.o. male with radicular C7 pain and weakness, s/p ACDF C6-7 on 02/23/19. PMH includes cervical vertebral fx, arthritis.   PT Comments    Pt progressing well with mobility. Indep to don cervical collar; reviewed precautions. Mod indep ambulation with RW; ambulating without DME at supervision-level. Declined stair training. Pt has met short-term acute PT goals; has no further questions or concerns; is planning for d/c home today. Will d/c acute PT.   Follow Up Recommendations  No PT follow up;Supervision for mobility/OOB     Equipment Recommendations  Rolling walker with 5" wheels    Recommendations for Other Services       Precautions / Restrictions Precautions Precautions: Cervical Precaution Comments: Verbally reviewed precautions Required Braces or Orthoses: Cervical Brace Cervical Brace: Hard collar Restrictions Weight Bearing Restrictions: No    Mobility  Bed Mobility Overal bed mobility: Modified Independent             General bed mobility comments: Bed flat, good log roll technique, no use of bed rails  Transfers Overall transfer level: Independent Equipment used: None Transfers: Sit to/from Stand              Ambulation/Gait Ambulation/Gait assistance: Modified independent (Device/Increase time);Supervision Gait Distance (Feet): 140 Feet Assistive device: Rolling walker (2 wheeled);IV Pole;None Gait Pattern/deviations: Step-through pattern;Decreased stride length Gait velocity: Decreased Gait velocity interpretation: <1.8 ft/sec, indicate of risk for recurrent falls General Gait Details: Slow, steady, guarded gait; mod indep with RW. Supervision without DME; cues to increase step length which improved stability. Pt plans to use RW initially at home   Stairs Stairs: (Pt declined)            Wheelchair Mobility    Modified Rankin (Stroke Patients Only)       Balance Overall balance assessment: Needs assistance Sitting-balance support: No upper extremity supported;Feet supported Sitting balance-Leahy Scale: Good       Standing balance-Leahy Scale: Fair Standing balance comment: better balance with RW                            Cognition Arousal/Alertness: Awake/alert Behavior During Therapy: WFL for tasks assessed/performed Overall Cognitive Status: Within Functional Limits for tasks assessed                                        Exercises      General Comments        Pertinent Vitals/Pain Pain Assessment: Faces Faces Pain Scale: Hurts little more Pain Location: Neck, L shoulder Pain Descriptors / Indicators: Guarding;Discomfort Pain Intervention(s): Limited activity within patient's tolerance;Patient requesting pain meds-RN notified    Home Living                      Prior Function            PT Goals (current goals can now be found in the care plan section) Acute Rehab PT Goals Patient Stated Goal: decrease pain, go home today PT Goal Formulation: With patient Time For Goal Achievement: 03/10/19 Potential to Achieve Goals: Good Progress towards PT goals: Progressing toward goals    Frequency    7X/week      PT Plan Current plan  remains appropriate    Co-evaluation              AM-PAC PT "6 Clicks" Mobility   Outcome Measure  Help needed turning from your back to your side while in a flat bed without using bedrails?: None Help needed moving from lying on your back to sitting on the side of a flat bed without using bedrails?: None Help needed moving to and from a bed to a chair (including a wheelchair)?: None Help needed standing up from a chair using your arms (e.g., wheelchair or bedside chair)?: A Little Help needed to walk in hospital room?: A Little Help needed climbing 3-5  steps with a railing? : A Little 6 Click Score: 21    End of Session Equipment Utilized During Treatment: Gait belt;Cervical collar Activity Tolerance: Patient tolerated treatment well Patient left: in chair;with call bell/phone within reach Nurse Communication: Mobility status;Patient requests pain meds PT Visit Diagnosis: Other abnormalities of gait and mobility (R26.89);Pain Pain - Right/Left: Left Pain - part of body: Shoulder     Time: 5916-3846 PT Time Calculation (min) (ACUTE ONLY): 15 min  Charges:  $Gait Training: 8-22 mins                    Mabeline Caras, PT, DPT Acute Rehabilitation Services  Pager [phone] Office [place] 02/25/2019, 11:02 AM

## 2019-02-25 NOTE — Care Management (Signed)
Case manager has worked with [employer] [place] that locates vendors for DME. Rolling walker will be delivered to patient's room this morning from [place]. Patient has no further CM needs.   Ricki Miller, RN Case Manager  [phone]

## 2019-02-25 NOTE — Progress Notes (Signed)
Subjective: 2 Days Post-Op Procedure(s) (LRB): ANTERIOR CERVICAL DECOMPRESSION/DISCECTOMY FUSION C6-7 (N/A) Patient reports pain as 8 on 0-10 scale.   Reports unchanged arm pain (left shoulder remains painful with ROM) reports incisional neck pain   Positive void Negative bowel movement Positive flatus Denies belly pain. Negative chest pain or shortness of breath  Objective: Vital signs in last 24 hours: Temp:  [98 F (36.7 C)-98.5 F (36.9 C)] 98.5 F (36.9 C) (06/05 0403) Pulse Rate:  [63-78] 63 (06/05 0403) Resp:  [16-17] 17 (06/05 0403) BP: (141-161)/(68-76) 154/76 (06/05 0403) SpO2:  [94 %-100 %] 96 % (06/05 0403)  Intake/Output from previous day: 06/04 0701 - 06/05 0700 In: 480 [P.O.:480] Out: -  Intake/Output this shift: No intake/output data recorded.  No results for input(s): HGB in the last 72 hours. No results for input(s): WBC, RBC, HCT, PLT in the last 72 hours. No results for input(s): NA, K, CL, CO2, BUN, CREATININE, GLUCOSE, CALCIUM in the last 72 hours. No results for input(s): LABPT, INR in the last 72 hours.  Neurologically intact ABD soft Neurovascular intact Sensation intact distally Intact pulses distally Dorsiflexion/Plantar flexion intact Incision: dressing C/D/I  Calves tender to palpation bilaterally-- patient states that this is his baseline to to LBP and radicular leg pain. No discoloration, swelling, or palbale cords.    Assessment/Plan: 2 Days Post-Op Procedure(s) (LRB): ANTERIOR CERVICAL DECOMPRESSION/DISCECTOMY FUSION C6-7 (N/A) Patient stable  xrays n/a          Mobilization with physical therapy Encourage incentive spirometry Continue care  Advance diet Up with therapy  Patient reports ongoing pain.   Plan to D/C home today after clearance by PT   Yvonne Kendall Ward 02/25/2019, 8:05 AM [phone]

## 2019-02-25 NOTE — Progress Notes (Addendum)
OT Cancellation Note  Patient Details Name: Kyle Davidson MRN: [id number] DOB: 01-13-1951   Cancelled Treatment:    Reason Eval/Treat Not Completed: Other (comment).  Attempted skilled OT. Session.  Pt. Reports he is awaiting d/c home in a little while and does not need anymore therapy.  Reports PT told him he did a good job and was ready for home.  Pt. Also stated he is very pleased with our dept. And all that we have taught him but does not have any questions or need for review.  Discussed our role in OT and our current ADL goals.   Politely declines any OT.  D/c home later today.   Janice Coffin, COTA/L 02/25/2019, 11:51 AM

## 2019-03-08 ENCOUNTER — Other Ambulatory Visit: Payer: Self-pay

## 2019-03-08 ENCOUNTER — Ambulatory Visit
Admission: RE | Admit: 2019-03-08 | Discharge: 2019-03-08 | Disposition: A | Discharge status: Home / Self Care | Payer: Worker's Compensation | Source: Ambulatory Visit | Attending: Orthopedic Surgery | Admitting: Orthopedic Surgery

## 2019-03-08 ENCOUNTER — Other Ambulatory Visit: Payer: Self-pay | Admitting: Orthopedic Surgery

## 2019-03-08 DIAGNOSIS — Z4889 Encounter for other specified surgical aftercare: Secondary | ICD-10-CM

## 2019-03-08 MED ORDER — IOPAMIDOL (ISOVUE-370) INJECTION 76%
75.0000 mL | Freq: Once | INTRAVENOUS | Status: AC | PRN
  Administered 2019-03-08: 75 mL via INTRAVENOUS

## 2019-03-11 ENCOUNTER — Other Ambulatory Visit: Payer: Self-pay | Admitting: Orthopedic Surgery

## 2019-06-08 DIAGNOSIS — M5416 Radiculopathy, lumbar region: Secondary | ICD-10-CM | POA: Insufficient documentation

## 2019-06-14 ENCOUNTER — Telehealth: Payer: Self-pay | Admitting: *Deleted

## 2019-06-14 DIAGNOSIS — M4150 Other secondary scoliosis, site unspecified: Secondary | ICD-10-CM | POA: Insufficient documentation

## 2019-06-14 NOTE — Telephone Encounter (Signed)
Sherri from [facility] called back and confirmed surgeon is Dr. Rolena Infante as well as anesthesia will be general . I thanked Sherri for the call back and the information.

## 2019-06-14 NOTE — Telephone Encounter (Signed)
   [facility] Pre-operative Risk Assessment    Request for surgical clearance:  1. What type of surgery is being performed? XLIF L3-4   2. When is this surgery scheduled? TBD   3. What type of clearance is required (medical clearance vs. Pharmacy clearance to hold med vs. Both)? MEDICAL  4. Are there any medications that need to be held prior to surgery and how long? NONE LISTED    5. Practice name and name of physician performing surgery? [facility]; LEFT MESSAGE TO CONFIRM SURGERON    6. What is your office phone number [phone]    7.   What is your office fax number [phone]  8.   Anesthesia type (None, local, MAC, general) ? LEFT MESSAGE TO CONFIRM ANESTHESIA    Kyle Davidson 06/14/2019, 11:10 AM  _________________________________________________________________   (provider comments below)

## 2019-06-15 NOTE — Telephone Encounter (Signed)
   Primary Cardiologist: Mertie Moores, MD  Chart reviewed as part of pre-operative protocol coverage. Patient was contacted 06/15/2019 in reference to pre-operative risk assessment for pending surgery as outlined below.  Kyle Davidson was last seen on 10/22/2018 by Dr. Acie Fredrickson.  Since that day, Kyle Davidson has done well without chest pain or shortness of breath. He does not have prior history of CAD. Last normal myoview and echo were in 2015. He tolerated neck surgery in June 2020 without significant complication. His RCRI score is Class I risk, 0.4% risk of major cardiac event during perioperative period.   Therefore, based on ACC/AHA guidelines, the patient would be at acceptable risk for the planned procedure without further cardiovascular testing.   I will route this recommendation to the requesting party via Epic fax function and remove from pre-op pool.  Please call with questions.  [place], [place] 06/15/2019, 11:46 AM

## 2019-08-16 DIAGNOSIS — M25512 Pain in left shoulder: Secondary | ICD-10-CM | POA: Insufficient documentation

## 2019-09-19 ENCOUNTER — Encounter: Payer: Self-pay | Admitting: Internal Medicine

## 2019-09-29 ENCOUNTER — Other Ambulatory Visit: Payer: Self-pay | Admitting: Physician Assistant

## 2019-09-29 DIAGNOSIS — R1310 Dysphagia, unspecified: Secondary | ICD-10-CM

## 2019-10-03 ENCOUNTER — Other Ambulatory Visit: Payer: Medicare HMO

## 2019-10-18 ENCOUNTER — Other Ambulatory Visit: Payer: Self-pay | Admitting: Otolaryngology

## 2019-10-18 DIAGNOSIS — R1310 Dysphagia, unspecified: Secondary | ICD-10-CM

## 2019-10-20 ENCOUNTER — Telehealth: Payer: Self-pay | Admitting: *Deleted

## 2019-10-20 NOTE — Telephone Encounter (Signed)
Primary Cardiologist:Philip Nahser, MD  Chart reviewed as part of pre-operative protocol coverage. Because of Kyle Davidson's past medical history and time since last visit, he/she will require a follow-up visit in order to better assess preoperative cardiovascular risk.He has been cleared X 2 in the past to have this procedure, but has not had it done yet. He has not been seen in the office for one year. Will now need appointment for clearance.   Pre-op covering staff: - Please schedule appointment and call patient to inform them. - Please contact requesting surgeon's office via preferred method (i.e, phone, fax) to inform them of need for appointment prior to surgery.  If applicable, this message will also be routed to pharmacy pool and/or primary cardiologist for input on holding anticoagulant/antiplatelet agent as requested below so that this information is available at time of patient's appointment.   Kyle Sims, NP  10/20/2019, 9:14 AM

## 2019-10-20 NOTE — Telephone Encounter (Signed)
Left message for the pt to call the office to schedule an appt with Dr. Acie Fredrickson or his care team for pre op clearance.

## 2019-10-20 NOTE — Telephone Encounter (Signed)
   [facility] Pre-operative Risk Assessment    Request for surgical clearance: I S/W SHERRI AT [facility] THIS MORNING AS WE CLEARED PT FOR THE SAME SURGERY 06/14/19. [facility] OPTED LAST YEAR TO NOT HAVE THE SURGERY.   1. What type of surgery is being performed? L3-4 FUSION   2. When is this surgery scheduled? TBD   3. What type of clearance is required (medical clearance vs. Pharmacy clearance to hold med vs. Both)? MEDICAL  4. Are there any medications that need to be held prior to surgery and how long? NONE LISTED   5. Practice name and name of physician performing surgery? [facility]; DR. [place]   6. What is your office phone number [phone]    7.   What is your office fax number [phone]  8.   Anesthesia type (None, local, MAC, general) ? GENERAL   Julaine Hua 10/20/2019, 8:46 AM  _________________________________________________________________   (provider comments below)

## 2019-10-20 NOTE — Telephone Encounter (Signed)
Pt has been scheduled to see Robbie Lis, PA 10/31/19 @ 3:45 pm. I will send notes to surgeon Melina Schools, MD as Juluis Rainier about appt. I will remove from the pre op call back pool. I will send clearance notes to [facility] for upcoming appt.

## 2019-10-24 ENCOUNTER — Ambulatory Visit: Payer: Medicare HMO | Admitting: Internal Medicine
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
## 2019-10-31 ENCOUNTER — Ambulatory Visit: Payer: Medicare HMO | Admitting: Physician Assistant

## 2019-11-01 ENCOUNTER — Other Ambulatory Visit: Payer: Self-pay

## 2019-11-01 ENCOUNTER — Emergency Department (HOSPITAL_COMMUNITY): Payer: Medicare HMO

## 2019-11-01 ENCOUNTER — Emergency Department (HOSPITAL_COMMUNITY)
Admission: EM | Admit: 2019-11-01 | Discharge: 2019-11-01 | Disposition: A | Discharge status: Home / Self Care | Payer: Medicare HMO | Attending: Emergency Medicine | Admitting: Emergency Medicine

## 2019-11-01 ENCOUNTER — Encounter (HOSPITAL_COMMUNITY): Payer: Self-pay | Admitting: Emergency Medicine

## 2019-11-01 DIAGNOSIS — U071 COVID-19: Secondary | ICD-10-CM | POA: Diagnosis not present

## 2019-11-01 DIAGNOSIS — Z7982 Long term (current) use of aspirin: Secondary | ICD-10-CM | POA: Insufficient documentation

## 2019-11-01 DIAGNOSIS — Z79899 Other long term (current) drug therapy: Secondary | ICD-10-CM | POA: Insufficient documentation

## 2019-11-01 DIAGNOSIS — I10 Essential (primary) hypertension: Secondary | ICD-10-CM | POA: Diagnosis not present

## 2019-11-01 DIAGNOSIS — K8020 Calculus of gallbladder without cholecystitis without obstruction: Secondary | ICD-10-CM | POA: Diagnosis not present

## 2019-11-01 DIAGNOSIS — R0602 Shortness of breath: Secondary | ICD-10-CM | POA: Diagnosis present

## 2019-11-01 DIAGNOSIS — R1011 Right upper quadrant pain: Secondary | ICD-10-CM

## 2019-11-01 LAB — URINALYSIS, ROUTINE W REFLEX MICROSCOPIC
Bilirubin Urine: NEGATIVE
Glucose, UA: NEGATIVE mg/dL
Hgb urine dipstick: NEGATIVE
Ketones, ur: 5 mg/dL — AB
Leukocytes,Ua: NEGATIVE
Nitrite: NEGATIVE
Protein, ur: 100 mg/dL — AB
Specific Gravity, Urine: 1.04 — ABNORMAL HIGH (ref 1.005–1.030)
pH: 5 (ref 5.0–8.0)

## 2019-11-01 LAB — LIPASE, BLOOD: Lipase: 22 U/L (ref 11–51)

## 2019-11-01 LAB — CBC
HCT: 50.5 % (ref 39.0–52.0)
Hemoglobin: 17.3 g/dL — ABNORMAL HIGH (ref 13.0–17.0)
MCH: 32.3 pg (ref 26.0–34.0)
MCHC: 34.3 g/dL (ref 30.0–36.0)
MCV: 94.4 fL (ref 80.0–100.0)
Platelets: 107 10*3/uL — ABNORMAL LOW (ref 150–400)
RBC: 5.35 MIL/uL (ref 4.22–5.81)
RDW: 13.1 % (ref 11.5–15.5)
WBC: 6.1 10*3/uL (ref 4.0–10.5)
nRBC: 0 % (ref 0.0–0.2)

## 2019-11-01 LAB — HEPATIC FUNCTION PANEL
ALT: 14 U/L (ref 0–44)
AST: 20 U/L (ref 15–41)
Albumin: 3.6 g/dL (ref 3.5–5.0)
Alkaline Phosphatase: 41 U/L (ref 38–126)
Bilirubin, Direct: 0.2 mg/dL (ref 0.0–0.2)
Indirect Bilirubin: 0.8 mg/dL (ref 0.3–0.9)
Total Bilirubin: 1 mg/dL (ref 0.3–1.2)
Total Protein: 7.2 g/dL (ref 6.5–8.1)

## 2019-11-01 LAB — BASIC METABOLIC PANEL
Anion gap: 10 (ref 5–15)
BUN: 15 mg/dL (ref 8–23)
CO2: 25 mmol/L (ref 22–32)
Calcium: 9 mg/dL (ref 8.9–10.3)
Chloride: 106 mmol/L (ref 98–111)
Creatinine, Ser: 0.99 mg/dL (ref 0.61–1.24)
GFR calc Af Amer: >60 mL/min (ref >60)
GFR calc non Af Amer: >60 mL/min (ref >60)
Glucose, Bld: 154 mg/dL — ABNORMAL HIGH (ref 70–99)
Potassium: 3.4 mmol/L — ABNORMAL LOW (ref 3.5–5.1)
Sodium: 141 mmol/L (ref 135–145)

## 2019-11-01 LAB — RESPIRATORY PANEL BY RT PCR (FLU A&B, COVID)
Influenza A by PCR: NEGATIVE
Influenza B by PCR: NEGATIVE
SARS Coronavirus 2 by RT PCR: POSITIVE — AB

## 2019-11-01 LAB — TROPONIN I (HIGH SENSITIVITY)
Troponin I (High Sensitivity): 8 ng/L (ref <18)
Troponin I (High Sensitivity): 6 ng/L (ref <18)

## 2019-11-01 LAB — CK: Total CK: 33 U/L — ABNORMAL LOW (ref 49–397)

## 2019-11-01 MED ORDER — ONDANSETRON HCL 4 MG/2ML IJ SOLN
4.0000 mg | Freq: Once | INTRAMUSCULAR | Status: AC
  Administered 2019-11-01: 4 mg via INTRAVENOUS
  Filled 2019-11-01: qty 2

## 2019-11-01 MED ORDER — SODIUM CHLORIDE 0.9 % IV BOLUS
1000.0000 mL | Freq: Once | INTRAVENOUS | Status: AC
  Administered 2019-11-01: 1000 mL via INTRAVENOUS

## 2019-11-01 MED ORDER — MORPHINE SULFATE (PF) 4 MG/ML IV SOLN
4.0000 mg | Freq: Once | INTRAVENOUS | Status: AC
  Administered 2019-11-01: 4 mg via INTRAVENOUS
  Filled 2019-11-01: qty 1

## 2019-11-01 MED ORDER — ALUM & MAG HYDROXIDE-SIMETH 200-200-20 MG/5ML PO SUSP
30.0000 mL | Freq: Once | ORAL | Status: AC
  Administered 2019-11-01: 30 mL via ORAL
  Filled 2019-11-01: qty 30

## 2019-11-01 MED ORDER — FAMOTIDINE IN NACL 20-0.9 MG/50ML-% IV SOLN
20.0000 mg | Freq: Once | INTRAVENOUS | Status: AC
  Administered 2019-11-01: 20 mg via INTRAVENOUS
  Filled 2019-11-01: qty 50

## 2019-11-01 MED ORDER — SODIUM CHLORIDE 0.9% FLUSH: 3.0000 mL | Freq: Once | INTRAVENOUS | Status: DC

## 2019-11-01 NOTE — ED Triage Notes (Signed)
Pt in with c/o sob x 1 wk, cp and general weakness. Pt states he went to [facility] today, got negative Covid test, but staff concerned about shallow breaths. Hx of enlarged aorta, states pain worse w/deep breaths.

## 2019-11-01 NOTE — ED Provider Notes (Signed)
[facility] EMERGENCY DEPARTMENT Provider Note   CSN: [id number] Arrival date & time: 11/01/19  1123     History Chief Complaint  Patient presents with  . Shortness of Breath  . Chest Pain    Kyle Davidson is a 69 y.o. male who presents with chest pain and SOB. He states he started feeling bad about one week ago. It started with some constipation which he has issues with sometimes but easily clears up with stool softeners and laxatives but seemed to be worse than normal. Then he started to have fever, chills. He went to [facility] and was treated for a sinus infection with Augmentin. He then started having diffuse body aches and his upper abdomen has been hurting. He is also having left sided chest pain and it's hard to take a good deep breath. He also has been having headaches and poor appetite. He denies cough, vomiting, diarrhea. He feels like something may be wrong with his gallbladder because he feel nauseous when he eats. He also has been having dark urine but denies dysuria. [facility] and [facility] did a rapid covid and flu test which were both negative. Due to his tachypnea they advised him to come to the ED.  HPI     Past Medical History:  Diagnosis Date  . Anal fistula   . Arthritis   . BPH (benign prostatic hyperplasia)   . GERD (gastroesophageal reflux disease)   . Heart murmur    when he was younger, never had issues,   . Hiatal hernia   . History of basal cell carcinoma excision    nose and face  . History of esophageal dilatation    10-25-2015  . History of kidney stones    past hx. "was told presntly has one in right kidny-not bothersome.  Marland Kitchen History of urinary retention   . History of vertebral fracture    neck x2 area's per pt  . Lower urinary tract symptoms (LUTS)   . PONV (postoperative nausea and vomiting)    occ  . Rash    foot  . UTI (urinary tract infection)     Patient Active Problem List   Diagnosis Date Noted   . Cervical disc herniation 02/23/2019  . Ascending aorta dilatation (HCC) 10/25/2018  . Appendicolith 04/03/2014  . Dyspnea on exertion 02/01/2014  . Chest pain 02/01/2014  . Essential hypertension 02/01/2014  . Hyperlipidemia 02/01/2014  . Post-op pain 08/22/2013  . Bilateral inguinal hernia 07/11/2013  . Umbilical hernia [id number]  . Unspecified constipation 07/11/2013  . Angioedema of lips 12/26/2011  . GERD (gastroesophageal reflux disease) 12/26/2011  . Dysphagia 12/26/2011  . Sinusitis 12/26/2011  . Elevated BP 12/26/2011  . Shoulder pain 12/26/2011    Past Surgical History:  Procedure Laterality Date  . ANTERIOR CERVICAL DECOMP/DISCECTOMY FUSION  02/23/2019  . ANTERIOR CERVICAL DECOMP/DISCECTOMY FUSION N/A 02/23/2019   Procedure: ANTERIOR CERVICAL DECOMPRESSION/DISCECTOMY FUSION C6-7;  Surgeon: Melina Schools, MD;  Location: [facility];  Service: Orthopedics;  Laterality: N/A;  3 hrs  . CARDIOVASCULAR STRESS TEST  02-20-2014   Low risk lexiscan nuclear study w/ no reversible ischemia/  normal LV function and wall motion, ef 67%  . CYSTOSCOPY WITH RETROGRADE PYELOGRAM, URETEROSCOPY AND STENT PLACEMENT Right 04/07/2018   Procedure: CYSTOSCOPY WITH RETROGRADE PYELOGRAM, URETEROSCOPY AND STONE EXTRACTION;  Surgeon: Raynelle Bring, MD;  Location: [facility];  Service: Urology;  Laterality: Right;  . ESOPHAGOGASTRODUODENOSCOPY (EGD) WITH PROPOFOL N/A 11/19/2015  Procedure: ESOPHAGOGASTRODUODENOSCOPY (EGD) WITH PROPOFOL;  Surgeon: Laurence Spates, MD;  Location: [facility];  Service: Endoscopy;  Laterality: N/A;  . EXTRACORPOREAL SHOCK WAVE LITHOTRIPSY Right 12-10-2015  . EXTRACORPOREAL SHOCK WAVE LITHOTRIPSY Right 03/11/2018   Procedure: RIGHT EXTRACORPOREAL SHOCK WAVE LITHOTRIPSY (ESWL);  Surgeon: Raynelle Bring, MD;  Location: [facility];  Service: Urology;  Laterality: Right;  . FISTULOTOMY N/A 03/21/2016   Procedure: FISTULOTOMY;  Surgeon: Leighton Ruff, MD;  Location: [facility];  Service: General;  Laterality: N/A;  . HAND SURGERY  x2  2008   repair left thumb injury  . INGUINAL HERNIA REPAIR Bilateral 08/16/2013   Procedure: LAPAROSCOPIC BILATERAL INGUINAL HERNIA WITH UMBILICAL HERNIA;  Surgeon: Joyice Faster. Cornett, MD;  Location: [facility];  Service: General;  Laterality: Bilateral;  . INSERTION OF MESH Bilateral 08/16/2013   Procedure: INSERTION OF MESH;  Surgeon: Joyice Faster. Cornett, MD;  Location: [facility];  Service: General;  Laterality: Bilateral;  . LUMBAR FUSION  x2  last one 1997 (approx)  . NASAL SEPTUM SURGERY  1980's  . SAVORY DILATION N/A 11/19/2015   Procedure: SAVORY DILATION;  Surgeon: Laurence Spates, MD;  Location: [facility];  Service: Endoscopy;  Laterality: N/A;  . SHOULDER ARTHROSCOPY W/ ROTATOR CUFF REPAIR Bilateral right 2013/  left 1990's   and Debridement labral tear  . TRANSTHORACIC ECHOCARDIOGRAM  02-20-2014   normal LV function and wall motion , ef 50-55%/  mild dilated ascending aorta/  trivial MR and TR/  mild LAE       Family History  Problem Relation Age of Onset  . Heart disease Mother        No details  . Bladder Cancer Father   . Prostate cancer Father   . Heart disease Father        MI at later age  . Heart attack Father   . Arrhythmia Brother        Had to be cardioverted    Social History   Tobacco Use  . Smoking status: Never Smoker  . Smokeless tobacco: Never Used  Substance Use Topics  . Alcohol use: No  . Drug use: No    Home Medications Prior to Admission medications   Medication Sig Start Date End Date Taking? Authorizing Provider  acetaminophen (TYLENOL) 500 MG tablet Take 1,000 mg by mouth every 6 (six) hours as needed for mild pain.   Yes [provider]  amoxicillin-clavulanate (AUGMENTIN) 875-125 MG tablet Take 1 tablet by mouth 2 (two) times daily. 10 DS 10/27/19  Yes [provider]  aspirin EC 81 MG tablet Take 81 mg by mouth daily.   Yes [provider]  Omega-3 Fatty  Acids (FISH OIL) 1000 MG CAPS Take 2,000 mg by mouth daily.   Yes [provider]  ondansetron (ZOFRAN) 4 MG tablet Take 1 tablet (4 mg total) by mouth every 8 (eight) hours as needed for nausea or vomiting. 02/23/19  Yes Melina Schools, MD  pantoprazole (PROTONIX) 40 MG tablet Take 40 mg by mouth 2 (two) times daily. 09/26/19  Yes [provider]    Allergies    Codeine  Review of Systems   Review of Systems  Constitutional: Positive for activity change, chills, diaphoresis, fatigue and fever.  HENT: Negative for congestion, ear pain, sinus pain and sore throat.   Respiratory: Positive for shortness of breath. Negative for cough.   Cardiovascular: Positive for chest pain. Negative for palpitations and leg swelling.  Gastrointestinal: Positive for abdominal pain, constipation  and nausea. Negative for diarrhea and vomiting.  Genitourinary: Positive for decreased urine volume. Negative for difficulty urinating, dysuria and flank pain.  Musculoskeletal: Positive for myalgias.  All other systems reviewed and are negative.   Physical Exam Updated Vital Signs BP (!) 148/87   Pulse 95   Temp 98 F (36.7 C) (Oral)   Resp (!) 21   SpO2 98%   Physical Exam Vitals and nursing note reviewed.  Constitutional:      General: He is not in acute distress.    Appearance: Normal appearance. He is well-developed. He is not ill-appearing.     Comments: Calm and cooperative. NAD. Fatigued appearing  HENT:     Head: Normocephalic and atraumatic.     Mouth/Throat:     Mouth: Mucous membranes are moist.  Eyes:     General: No scleral icterus.       Right eye: No discharge.        Left eye: No discharge.     Conjunctiva/sclera: Conjunctivae normal.     Pupils: Pupils are equal, round, and reactive to light.  Cardiovascular:     Rate and Rhythm: Normal rate and regular rhythm.  Pulmonary:     Effort: Pulmonary effort is normal. No respiratory distress.     Breath sounds: Normal  breath sounds.  Abdominal:     General: There is no distension.     Palpations: Abdomen is soft.     Tenderness: There is abdominal tenderness (RUQ and LUQ tenderness).  Musculoskeletal:     Cervical back: Normal range of motion.     Right lower leg: No edema.     Left lower leg: No edema.  Skin:    General: Skin is warm and dry.  Neurological:     Mental Status: He is alert and oriented to person, place, and time.  Psychiatric:        Behavior: Behavior normal.     ED Results / Procedures / Treatments   Labs (all labs ordered are listed, but only abnormal results are displayed) Labs Reviewed  RESPIRATORY PANEL BY RT PCR (FLU A&B, COVID) - Abnormal; Notable for the following components:      Result Value   SARS Coronavirus 2 by RT PCR POSITIVE (*)    All other components within normal limits  BASIC METABOLIC PANEL - Abnormal; Notable for the following components:   Potassium 3.4 (*)    Glucose, Bld 154 (*)    All other components within normal limits  CBC - Abnormal; Notable for the following components:   Hemoglobin 17.3 (*)    Platelets 107 (*)    All other components within normal limits  URINALYSIS, ROUTINE W REFLEX MICROSCOPIC - Abnormal; Notable for the following components:   Color, Urine AMBER (*)    Specific Gravity, Urine 1.040 (*)    Ketones, ur 5 (*)    Protein, ur 100 (*)    Bacteria, UA RARE (*)    All other components within normal limits  CK - Abnormal; Notable for the following components:   Total CK 33 (*)    All other components within normal limits  HEPATIC FUNCTION PANEL  LIPASE, BLOOD  TROPONIN I (HIGH SENSITIVITY)  TROPONIN I (HIGH SENSITIVITY)    EKG None  Radiology DG Chest 2 View  Result Date: 11/01/2019 CLINICAL DATA:  Generalized weakness EXAM: CHEST - 2 VIEW COMPARISON:  None. FINDINGS: Normal mediastinum and cardiac silhouette. Normal pulmonary vasculature. No evidence of effusion, infiltrate, or pneumothorax.  No acute bony  abnormality. Anterior cervical fusion IMPRESSION: No acute cardiopulmonary process. Electronically Signed   By: Suzy Bouchard M.D.   On: 11/01/2019 12:00   US Abdomen Limited RUQ  Result Date: 11/01/2019 CLINICAL DATA:  Right upper quadrant abdominal pain for the past week. EXAM: ULTRASOUND ABDOMEN LIMITED RIGHT UPPER QUADRANT COMPARISON:  Abdomen and pelvis CT dated 08/12/2018. FINDINGS: Gallbladder: Large number of small, nonshadowing gallstones in the gallbladder measuring up to 8 mm in maximum diameter each. No gallbladder wall thickening or pericholecystic fluid. No sonographic Murphy sign. Common bile duct: Diameter: 4.1 mm Liver: No focal lesion identified. Within normal limits in parenchymal echogenicity. Portal vein is patent on color Doppler imaging with normal direction of blood flow towards the liver. Other: None. IMPRESSION: Cholelithiasis without evidence of cholecystitis. Electronically Signed   By: Claudie Revering M.D.   On: 11/01/2019 15:29    Procedures Procedures (including critical care time)  Medications Ordered in ED Medications  sodium chloride flush (NS) 0.9 % injection 3 mL (has no administration in time range)  morphine 4 MG/ML injection 4 mg (4 mg Intravenous Given 11/01/19 1408)  sodium chloride 0.9 % bolus 1,000 mL (0 mLs Intravenous Stopped 11/01/19 1623)  famotidine (PEPCID) IVPB 20 mg premix (0 mg Intravenous Stopped 11/01/19 1643)  alum & mag hydroxide-simeth (MAALOX/MYLANTA) 200-200-20 MG/5ML suspension 30 mL (30 mLs Oral Given 11/01/19 1556)  ondansetron (ZOFRAN) injection 4 mg (4 mg Intravenous Given 11/01/19 1556)    ED Course  I have reviewed the triage vital signs and the nursing notes.  Pertinent labs & imaging results that were available during my care of the patient were reviewed by me and considered in my medical decision making (see chart for details).  69 year old male presents with multiple complaints. He is reporting fever, chills, chest discomfort, SOB,  headache, nausea, constipation, dark urine. Had a negative COVID test at [facility] today but was sent to the ED for tachypnea and for generally feeling unwell. On exam heart is regular rate and rhythm. Lungs are CTA. Abdomen is soft and he is tender over the RUQ and LUQ. He is very concerned about his gallbladder. I am somewhat suspicious for COVID as well. EKG is SR. CXR is clear. CBC shows normal WBC, elevated hgb, and thrombocytopenia. BMP shows mild hypokalemia (3.4) and hyperglycemia (154). Will add on LFT, lipase, UA, CK and RUQ [place]  LFTs and lipase are normal. UA shows high specific gravity. He was given 1 L of fluid. First and second trop are negative. CK is normal. RUQ US shows gallstones without evidence of cholecystitis. At shift change COVID test is pending. Pt given reassurance. He was given morphine for chest/abdominal pain and doesn't feel much better. Will try pepcid and GI cocktail. Will plan on f/u with surgeon for gallstones.  Care signed out to [facility] PA-C  Vuong STANLY MCNELIS was evaluated in Emergency Department on 11/01/2019 for the symptoms described in the history of present illness. He was evaluated in the context of the global COVID-19 pandemic, which necessitated consideration that the patient might be at risk for infection with the SARS-CoV-2 virus that causes COVID-19. Institutional protocols and algorithms that pertain to the evaluation of patients at risk for COVID-19 are in a state of rapid change based on information released by regulatory bodies including the CDC and federal and state organizations. These policies and algorithms were followed during the patient's care in the ED.  MDM Rules/Calculators/A&P  Final Clinical Impression(s) / ED Diagnoses Final diagnoses:  RUQ pain  Calculus of gallbladder without cholecystitis without obstruction  COVID-19    Rx / DC Orders ED Discharge Orders    None       Recardo Evangelist, PA-C 11/01/19 1707     Sherwood Gambler, MD 11/02/19 [phone]

## 2019-11-01 NOTE — ED Notes (Signed)
Pt transported to US

## 2019-11-01 NOTE — ED Provider Notes (Signed)
5:07 PM signout from [employer] at shift change.  Patient with fevers intermittently over the past week, concern for coronavirus.  Covid PCR has come back positive.  Patient has ambulated without desaturation.  Temperature controlled.  He looks well.  No tachycardia.  Labs are reassuring.  Patient is also had some abdominal pain prior to coronavirus symptoms.  Patient was evaluated with right upper quadrant ultrasound and was found to have gallstones without signs of cholecystitis.  Surgery referral will be given.  Pain currently controlled.  Encouraged surgery follow-up as needed.  The patient was urged to return to the Emergency Department immediately with worsening of current symptoms, worsening abdominal pain, persistent vomiting, blood noted in stools, fever, or any other concerns. The patient verbalized understanding.    Patient and I talked at length about isolation at home and went to break isolation.  We discussed signs and symptoms which should cause [place] to return including worsening shortness of breath, trouble breathing, increased work of breathing, new concerns or other concerns.  Patient is in no distress in the room.  He is ready for discharge to home.  He is calling his wife for transport.  BP 131/85   Pulse 71   Temp 98 F (36.7 C) (Oral)   Resp 16   SpO2 97%     Carlisle Cater, PA-C 11/01/19 1709    Carmin Muskrat, MD 11/01/19 2230

## 2019-11-01 NOTE — Discharge Instructions (Signed)
Your COVID test was positive today.  This is likely why you are feeling poorly.  You were also diagnosed with gallstones.  You do not have any signs of a gallbladder infection today.  Please avoid foods that make this worse and use over-the-counter pain medications as needed for pain.  Please follow-up with the surgeon listed when you are able if you continue to have abdominal pain, nausea or vomiting.

## 2019-11-01 NOTE — ED Notes (Signed)
Pt's walking Sa02 96% on RA

## 2019-11-02 ENCOUNTER — Other Ambulatory Visit: Payer: Self-pay | Admitting: Internal Medicine

## 2019-11-02 ENCOUNTER — Ambulatory Visit (HOSPITAL_COMMUNITY)
Admission: RE | Admit: 2019-11-02 | Discharge: 2019-11-02 | Disposition: A | Discharge status: Home / Self Care | Payer: Medicare Other | Source: Ambulatory Visit | Attending: Pulmonary Disease | Admitting: Pulmonary Disease

## 2019-11-02 ENCOUNTER — Telehealth: Payer: Self-pay | Admitting: Internal Medicine

## 2019-11-02 DIAGNOSIS — U071 COVID-19: Secondary | ICD-10-CM

## 2019-11-02 DIAGNOSIS — I10 Essential (primary) hypertension: Secondary | ICD-10-CM | POA: Insufficient documentation

## 2019-11-02 DIAGNOSIS — Z23 Encounter for immunization: Secondary | ICD-10-CM | POA: Diagnosis not present

## 2019-11-02 MED ORDER — SODIUM CHLORIDE 0.9 % IV SOLN
700.0000 mg | Freq: Once | INTRAVENOUS | Status: AC
  Administered 2019-11-02: 700 mg via INTRAVENOUS
  Filled 2019-11-02: qty 20

## 2019-11-02 MED ORDER — DIPHENHYDRAMINE HCL 50 MG/ML IJ SOLN: 50.0000 mg | Freq: Once | INTRAMUSCULAR | Status: DC | PRN

## 2019-11-02 MED ORDER — FAMOTIDINE IN NACL 20-0.9 MG/50ML-% IV SOLN: 20.0000 mg | Freq: Once | INTRAVENOUS | Status: DC | PRN

## 2019-11-02 MED ORDER — SODIUM CHLORIDE 0.9 % IV SOLN
INTRAVENOUS | Status: DC | PRN
  Administered 2019-11-02: 250 mL via INTRAVENOUS

## 2019-11-02 MED ORDER — ALBUTEROL SULFATE HFA 108 (90 BASE) MCG/ACT IN AERS: 2.0000 | INHALATION_SPRAY | Freq: Once | RESPIRATORY_TRACT | Status: DC | PRN

## 2019-11-02 MED ORDER — EPINEPHRINE 0.3 MG/0.3ML IJ SOAJ: 0.3000 mg | Freq: Once | INTRAMUSCULAR | Status: DC | PRN

## 2019-11-02 NOTE — Progress Notes (Signed)
  I connected by phone with Kyle Davidson on 11/02/2019 at 9:33 AM to discuss the potential use of an new treatment for mild to moderate COVID-19 viral infection in non-hospitalized patients.  This patient is a 69 y.o. male that meets the FDA criteria for Emergency Use Authorization of bamlanivimab or casirivimab\imdevimab.  Has a (+) direct SARS-CoV-2 viral test result  Has mild or moderate COVID-19   Is ? 70 years of age and weighs ? 40 kg  Is NOT hospitalized due to COVID-19  Is NOT requiring oxygen therapy or requiring an increase in baseline oxygen flow rate due to COVID-19  Is within 10 days of symptom onset  Has at least one of the high risk factor(s) for progression to severe COVID-19 and/or hospitalization as defined in EUA.  Specific high risk criteria : >/= 70 yo   I have spoken and communicated the following to the patient or parent/caregiver:  1. FDA has authorized the emergency use of bamlanivimab and casirivimab\imdevimab for the treatment of mild to moderate COVID-19 in adults and pediatric patients with positive results of direct SARS-CoV-2 viral testing who are 20 years of age and older weighing at least 40 kg, and who are at high risk for progressing to severe COVID-19 and/or hospitalization.  2. The significant known and potential risks and benefits of bamlanivimab and casirivimab\imdevimab, and the extent to which such potential risks and benefits are unknown.  3. Information on available alternative treatments and the risks and benefits of those alternatives, including clinical trials.  4. Patients treated with bamlanivimab and casirivimab\imdevimab should continue to self-isolate and use infection control measures (e.g., wear mask, isolate, social distance, avoid sharing personal items, clean and disinfect "high touch" surfaces, and frequent handwashing) according to CDC guidelines.   5. The patient or parent/caregiver has the option to accept or refuse  bamlanivimab or casirivimab\imdevimab .  After reviewing this information with the patient, The patient agreed to proceed with receiving the bamlanimivab infusion and will be provided a copy of the Fact sheet prior to receiving the infusion.   Appointment scheduled for 2/10 at 1430.  Alan Ripper, NP-C [place]

## 2019-11-02 NOTE — Discharge Instructions (Signed)

## 2019-11-02 NOTE — Telephone Encounter (Signed)
Called to discuss with patient about Covid symptoms and the use of bamlanivimab, a monoclonal antibody infusion for those with mild to moderate Covid symptoms and at high risk of hospitalization.   Pt qualifies for this infusion at [facility] infusion center due to co-morbid conditions and/or member of at risk group. He is >70yo. His wife is also having symptoms, but has not been tested. Pt is on day 8 of symptoms today so would need infusion by 2/12.   Gave pt and wife my personal cell. They would like more time to make decision. I have also told wife that she would be a candidate, but would need to be tested first.  They are aware of 10 day window.   Alan Ripper, NP-C [place]

## 2019-11-02 NOTE — Progress Notes (Signed)
  Diagnosis: COVID-19  Physician: Dr. Joya Gaskins  Procedure: Covid Infusion Clinic Med: bamlanivimab infusion - Provided patient with bamlanimivab fact sheet for patients, parents and caregivers prior to infusion.  Complications: No immediate complications noted.  Discharge: Discharged home   [name] 11/02/2019
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
## 2019-11-25 ENCOUNTER — Encounter (HOSPITAL_BASED_OUTPATIENT_CLINIC_OR_DEPARTMENT_OTHER): Payer: Self-pay | Admitting: General Surgery

## 2019-11-25 ENCOUNTER — Ambulatory Visit (INDEPENDENT_AMBULATORY_CARE_PROVIDER_SITE_OTHER): Payer: Medicare HMO | Admitting: Physician Assistant

## 2019-11-25 ENCOUNTER — Other Ambulatory Visit: Payer: Self-pay

## 2019-11-25 ENCOUNTER — Ambulatory Visit: Payer: Self-pay | Admitting: General Surgery

## 2019-11-25 ENCOUNTER — Encounter: Payer: Self-pay | Admitting: Physician Assistant

## 2019-11-25 VITALS — BP 124/74 | HR 68 | Ht 75.0 in | Wt 205.5 lb

## 2019-11-25 DIAGNOSIS — Z01810 Encounter for preprocedural cardiovascular examination: Secondary | ICD-10-CM | POA: Diagnosis not present

## 2019-11-25 DIAGNOSIS — I77810 Thoracic aortic ectasia: Secondary | ICD-10-CM | POA: Diagnosis not present

## 2019-11-25 NOTE — Progress Notes (Addendum)
Spoke w/ via phone for pre-op interview---[facility] needs dos----   Cbc with dif, bmet   (patient unable to come for pre op to do blood work)          COVID test ------positive covid 11-01-2019 no retest needed ( patient symptoms were fever and felt bad, did quarantine x 10 days, no symptoms since quarantine) Arrive at -------1130 am 12-01-2019 No food after midnight, clear liquids until 1030 am then npo Medications to take morning of surgery -----protonix Diabetic medication -----n/a Patient Special Instructions ----- Pre-Op special Istructions ----- Patient verbalized understanding of instructions that were given at this phone interview. Patient denies shortness of breath, chest pain, fever, cough a this phone interview.  Anesthesia Dilated ascending aorta, cardiac clearance PCP: [facility] Cardiologist :dr Cathie Olden, cardiac clearance bhavinkumar bhagat pa 11-25-2019 Chest x-ray :11-01-2019 EKG :11-01-2019 Echo :02-20-2014 epic Cardiac Cath : none Sleep Study/ CPAP :n/a Fasting Blood Sugar :      / Checks Blood Sugar -- times a day:  n/a Blood thinner : none ASA / Instructions/ Last Dose : patient stopped 81 mg aspirin 1 week ago on own  Patient denies shortness of breath, chest pain, fever, and cough at this phone interview.

## 2019-11-25 NOTE — Patient Instructions (Signed)
Medication Instructions:   Your physician recommends that you continue on your current medications as directed. Please refer to the Current Medication list given to you today.  *If you need a refill on your cardiac medications before your next appointment, please call your pharmacy*   Lab Work: [place]    If you have labs (blood work) drawn today and your tests are completely normal, you will receive your results only by: Marland Kitchen MyChart Message (if you have MyChart) OR . A paper copy in the mail If you have any lab test that is abnormal or we need to change your treatment, we will call you to review the results.   Testing/Procedures: NONE ORDERED  TODAY   Follow-Up: At [facility], you and your health needs are our priority.  As part of our continuing mission to provide you with exceptional heart care, we have created designated Provider Care Teams.  These Care Teams include your primary Cardiologist (physician) and Advanced Practice Providers (APPs -  Physician Assistants and Nurse Practitioners) who all work together to provide you with the care you need, when you need it.  We recommend signing up for the patient portal called "MyChart".  Sign up information is provided on this After Visit Summary.  MyChart is used to connect with patients for Virtual Visits (Telemedicine).  Patients are able to view lab/test results, encounter notes, upcoming appointments, etc.  Non-urgent messages can be sent to your provider as well.   To learn more about what you can do with MyChart, go to [email or web address].    Your next appointment:   1 year(s)  The format for your next appointment:   In Person  Provider:   You may see Mertie Moores, MD or one of the following Advanced Practice Providers on your designated Care Team:    Richardson Dopp, PA-C  [place], [place]  Daune Perch, NP    Other Instructions

## 2019-11-28 ENCOUNTER — Other Ambulatory Visit (HOSPITAL_COMMUNITY): Payer: Medicare HMO

## 2019-11-29 NOTE — Progress Notes (Signed)
ADDENDUM to progress note from [place] dated 03-05-201.  Chart reviewed by anesthesia , Konrad Felix PA,  Ok to proceed.

## 2019-12-01 ENCOUNTER — Encounter (HOSPITAL_BASED_OUTPATIENT_CLINIC_OR_DEPARTMENT_OTHER)
Admission: RE | Disposition: A | Discharge status: Home / Self Care | Payer: Self-pay | Source: Home / Self Care | Attending: General Surgery

## 2019-12-01 ENCOUNTER — Encounter (HOSPITAL_BASED_OUTPATIENT_CLINIC_OR_DEPARTMENT_OTHER): Payer: Self-pay | Admitting: General Surgery

## 2019-12-01 ENCOUNTER — Ambulatory Visit (HOSPITAL_BASED_OUTPATIENT_CLINIC_OR_DEPARTMENT_OTHER): Payer: Medicare HMO | Admitting: Physician Assistant

## 2019-12-01 ENCOUNTER — Ambulatory Visit (HOSPITAL_BASED_OUTPATIENT_CLINIC_OR_DEPARTMENT_OTHER)
Admission: RE | Admit: 2019-12-01 | Discharge: 2019-12-01 | Disposition: A | Discharge status: Home / Self Care | Payer: Medicare HMO | Attending: General Surgery | Admitting: General Surgery

## 2019-12-01 DIAGNOSIS — I739 Peripheral vascular disease, unspecified: Secondary | ICD-10-CM | POA: Diagnosis not present

## 2019-12-01 DIAGNOSIS — Z85828 Personal history of other malignant neoplasm of skin: Secondary | ICD-10-CM | POA: Insufficient documentation

## 2019-12-01 DIAGNOSIS — M1990 Unspecified osteoarthritis, unspecified site: Secondary | ICD-10-CM | POA: Diagnosis not present

## 2019-12-01 DIAGNOSIS — K8010 Calculus of gallbladder with chronic cholecystitis without obstruction: Secondary | ICD-10-CM | POA: Insufficient documentation

## 2019-12-01 DIAGNOSIS — Z7982 Long term (current) use of aspirin: Secondary | ICD-10-CM | POA: Insufficient documentation

## 2019-12-01 DIAGNOSIS — Z885 Allergy status to narcotic agent status: Secondary | ICD-10-CM | POA: Insufficient documentation

## 2019-12-01 DIAGNOSIS — Z79899 Other long term (current) drug therapy: Secondary | ICD-10-CM | POA: Diagnosis not present

## 2019-12-01 DIAGNOSIS — K219 Gastro-esophageal reflux disease without esophagitis: Secondary | ICD-10-CM | POA: Insufficient documentation

## 2019-12-01 DIAGNOSIS — Z20822 Contact with and (suspected) exposure to covid-19: Secondary | ICD-10-CM | POA: Insufficient documentation

## 2019-12-01 HISTORY — PX: CHOLECYSTECTOMY: SHX55

## 2019-12-01 LAB — BASIC METABOLIC PANEL
Anion gap: 11 (ref 5–15)
BUN: 10 mg/dL (ref 8–23)
CO2: 23 mmol/L (ref 22–32)
Calcium: 9.2 mg/dL (ref 8.9–10.3)
Chloride: 109 mmol/L (ref 98–111)
Creatinine, Ser: 0.63 mg/dL (ref 0.61–1.24)
GFR calc Af Amer: >60 mL/min (ref >60)
GFR calc non Af Amer: >60 mL/min (ref >60)
Glucose, Bld: 104 mg/dL — ABNORMAL HIGH (ref 70–99)
Potassium: 3.6 mmol/L (ref 3.5–5.1)
Sodium: 143 mmol/L (ref 135–145)

## 2019-12-01 LAB — CBC WITH DIFFERENTIAL/PLATELET
Abs Immature Granulocytes: 0.02 10*3/uL (ref 0.00–0.07)
Basophils Absolute: 0 10*3/uL (ref 0.0–0.1)
Basophils Relative: 1 %
Eosinophils Absolute: 0.1 10*3/uL (ref 0.0–0.5)
Eosinophils Relative: 1 %
HCT: 47 % (ref 39.0–52.0)
Hemoglobin: 16.3 g/dL (ref 13.0–17.0)
Immature Granulocytes: 0 %
Lymphocytes Relative: 36 %
Lymphs Abs: 2.4 10*3/uL (ref 0.7–4.0)
MCH: 33 pg (ref 26.0–34.0)
MCHC: 34.7 g/dL (ref 30.0–36.0)
MCV: 95.1 fL (ref 80.0–100.0)
Monocytes Absolute: 0.5 10*3/uL (ref 0.1–1.0)
Monocytes Relative: 8 %
Neutro Abs: 3.5 10*3/uL (ref 1.7–7.7)
Neutrophils Relative %: 54 %
Platelets: 133 10*3/uL — ABNORMAL LOW (ref 150–400)
RBC: 4.94 MIL/uL (ref 4.22–5.81)
RDW: 13.8 % (ref 11.5–15.5)
WBC: 6.5 10*3/uL (ref 4.0–10.5)
nRBC: 0 % (ref 0.0–0.2)

## 2019-12-01 SURGERY — LAPAROSCOPIC CHOLECYSTECTOMY
Anesthesia: General | Site: Abdomen

## 2019-12-01 MED ORDER — ONDANSETRON HCL 4 MG/2ML IJ SOLN
INTRAMUSCULAR | Status: DC | PRN
  Administered 2019-12-01: 4 mg via INTRAVENOUS

## 2019-12-01 MED ORDER — ROCURONIUM BROMIDE 10 MG/ML (PF) SYRINGE
PREFILLED_SYRINGE | INTRAVENOUS | Status: DC | PRN
  Administered 2019-12-01: 60 mg via INTRAVENOUS

## 2019-12-01 MED ORDER — LABETALOL HCL 5 MG/ML IV SOLN
INTRAVENOUS | Status: AC
  Filled 2019-12-01: qty 4

## 2019-12-01 MED ORDER — LIDOCAINE 2% (20 MG/ML) 5 ML SYRINGE
INTRAMUSCULAR | Status: AC
  Filled 2019-12-01: qty 5

## 2019-12-01 MED ORDER — HYDROMORPHONE HCL 1 MG/ML IJ SOLN
INTRAMUSCULAR | Status: AC
  Filled 2019-12-01: qty 1

## 2019-12-01 MED ORDER — MIDAZOLAM HCL 2 MG/2ML IJ SOLN
0.5000 mg | Freq: Once | INTRAMUSCULAR | Status: DC | PRN
  Filled 2019-12-01: qty 2

## 2019-12-01 MED ORDER — DEXAMETHASONE SODIUM PHOSPHATE 10 MG/ML IJ SOLN
INTRAMUSCULAR | Status: AC
  Filled 2019-12-01: qty 1

## 2019-12-01 MED ORDER — OXYCODONE HCL 5 MG PO TABS
5.0000 mg | ORAL_TABLET | Freq: Once | ORAL | Status: AC
  Administered 2019-12-01: 5 mg via ORAL
  Filled 2019-12-01: qty 1

## 2019-12-01 MED ORDER — BUPIVACAINE-EPINEPHRINE 0.5% -1:200000 IJ SOLN
INTRAMUSCULAR | Status: DC | PRN
  Administered 2019-12-01: 20 mL
  Administered 2019-12-01: 10 mL

## 2019-12-01 MED ORDER — SUGAMMADEX SODIUM 200 MG/2ML IV SOLN
INTRAVENOUS | Status: DC | PRN
  Administered 2019-12-01: 190 mg via INTRAVENOUS

## 2019-12-01 MED ORDER — ROCURONIUM BROMIDE 10 MG/ML (PF) SYRINGE
PREFILLED_SYRINGE | INTRAVENOUS | Status: AC
  Filled 2019-12-01: qty 10

## 2019-12-01 MED ORDER — PHENYLEPHRINE 40 MCG/ML (10ML) SYRINGE FOR IV PUSH (FOR BLOOD PRESSURE SUPPORT)
PREFILLED_SYRINGE | INTRAVENOUS | Status: DC | PRN
  Administered 2019-12-01 (×2): 40 ug via INTRAVENOUS

## 2019-12-01 MED ORDER — FENTANYL CITRATE (PF) 100 MCG/2ML IJ SOLN
INTRAMUSCULAR | Status: AC
  Filled 2019-12-01: qty 2

## 2019-12-01 MED ORDER — LACTATED RINGERS IV SOLN
INTRAVENOUS | Status: DC
  Filled 2019-12-01: qty 1000

## 2019-12-01 MED ORDER — MIDAZOLAM HCL 2 MG/2ML IJ SOLN
INTRAMUSCULAR | Status: DC | PRN
  Administered 2019-12-01: 2 mg via INTRAVENOUS

## 2019-12-01 MED ORDER — ENSURE PRE-SURGERY PO LIQD
296.0000 mL | Freq: Once | ORAL | Status: DC
  Filled 2019-12-01: qty 296

## 2019-12-01 MED ORDER — OXYCODONE HCL 5 MG PO TABS: 5.0000 mg | ORAL_TABLET | Freq: Four times a day (QID) | ORAL | 0 refills | Status: DC | PRN

## 2019-12-01 MED ORDER — PROPOFOL 10 MG/ML IV BOLUS
INTRAVENOUS | Status: AC
  Filled 2019-12-01: qty 20

## 2019-12-01 MED ORDER — CHLORHEXIDINE GLUCONATE CLOTH 2 % EX PADS
6.0000 | MEDICATED_PAD | Freq: Once | CUTANEOUS | Status: DC
  Filled 2019-12-01: qty 6

## 2019-12-01 MED ORDER — MEPERIDINE HCL 25 MG/ML IJ SOLN
6.2500 mg | INTRAMUSCULAR | Status: DC | PRN
  Filled 2019-12-01: qty 1

## 2019-12-01 MED ORDER — IBUPROFEN 800 MG PO TABS: 800.0000 mg | ORAL_TABLET | Freq: Three times a day (TID) | ORAL | 0 refills | Status: DC | PRN

## 2019-12-01 MED ORDER — ACETAMINOPHEN 500 MG PO TABS
ORAL_TABLET | ORAL | Status: AC
  Filled 2019-12-01: qty 2

## 2019-12-01 MED ORDER — FENTANYL CITRATE (PF) 100 MCG/2ML IJ SOLN
INTRAMUSCULAR | Status: DC | PRN
  Administered 2019-12-01 (×4): 50 ug via INTRAVENOUS

## 2019-12-01 MED ORDER — OXYCODONE HCL 5 MG PO TABS
ORAL_TABLET | ORAL | Status: AC
  Filled 2019-12-01: qty 1

## 2019-12-01 MED ORDER — LABETALOL HCL 5 MG/ML IV SOLN
INTRAVENOUS | Status: DC | PRN
  Administered 2019-12-01 (×2): 5 mg via INTRAVENOUS

## 2019-12-01 MED ORDER — ACETAMINOPHEN 500 MG PO TABS
1000.0000 mg | ORAL_TABLET | Freq: Once | ORAL | Status: DC
  Filled 2019-12-01: qty 2

## 2019-12-01 MED ORDER — KETOROLAC TROMETHAMINE 15 MG/ML IJ SOLN
15.0000 mg | INTRAMUSCULAR | Status: DC
  Filled 2019-12-01: qty 1

## 2019-12-01 MED ORDER — SCOPOLAMINE 1 MG/3DAYS TD PT72
MEDICATED_PATCH | TRANSDERMAL | Status: AC
  Filled 2019-12-01: qty 1

## 2019-12-01 MED ORDER — PROMETHAZINE HCL 25 MG/ML IJ SOLN
6.2500 mg | INTRAMUSCULAR | Status: DC | PRN
  Filled 2019-12-01: qty 1

## 2019-12-01 MED ORDER — PROPOFOL 10 MG/ML IV BOLUS
INTRAVENOUS | Status: DC | PRN
  Administered 2019-12-01: 150 mg via INTRAVENOUS

## 2019-12-01 MED ORDER — CEFAZOLIN SODIUM-DEXTROSE 2-4 GM/100ML-% IV SOLN
2.0000 g | INTRAVENOUS | Status: AC
  Administered 2019-12-01: 2 g via INTRAVENOUS
  Filled 2019-12-01: qty 100

## 2019-12-01 MED ORDER — LIDOCAINE 2% (20 MG/ML) 5 ML SYRINGE
INTRAMUSCULAR | Status: DC | PRN
  Administered 2019-12-01: 40 mg via INTRAVENOUS

## 2019-12-01 MED ORDER — DEXAMETHASONE SODIUM PHOSPHATE 10 MG/ML IJ SOLN
INTRAMUSCULAR | Status: DC | PRN
  Administered 2019-12-01 (×2): 5 mg via INTRAVENOUS

## 2019-12-01 MED ORDER — HYDROMORPHONE HCL 1 MG/ML IJ SOLN
0.2500 mg | INTRAMUSCULAR | Status: DC | PRN
  Administered 2019-12-01 (×2): 0.25 mg via INTRAVENOUS
  Administered 2019-12-01: .5 mg via INTRAVENOUS
  Administered 2019-12-01 (×2): 0.25 mg via INTRAVENOUS
  Administered 2019-12-01: 0.5 mg via INTRAVENOUS
  Filled 2019-12-01: qty 0.5

## 2019-12-01 MED ORDER — ACETAMINOPHEN 500 MG PO TABS
1000.0000 mg | ORAL_TABLET | ORAL | Status: AC
  Administered 2019-12-01: 1000 mg via ORAL
  Filled 2019-12-01: qty 2

## 2019-12-01 MED ORDER — PHENYLEPHRINE 40 MCG/ML (10ML) SYRINGE FOR IV PUSH (FOR BLOOD PRESSURE SUPPORT)
PREFILLED_SYRINGE | INTRAVENOUS | Status: AC
  Filled 2019-12-01: qty 10

## 2019-12-01 MED ORDER — ONDANSETRON HCL 4 MG/2ML IJ SOLN
INTRAMUSCULAR | Status: AC
  Filled 2019-12-01: qty 2

## 2019-12-01 MED ORDER — MIDAZOLAM HCL 2 MG/2ML IJ SOLN
INTRAMUSCULAR | Status: AC
  Filled 2019-12-01: qty 2

## 2019-12-01 MED ORDER — CEFAZOLIN SODIUM-DEXTROSE 2-4 GM/100ML-% IV SOLN
INTRAVENOUS | Status: AC
  Filled 2019-12-01: qty 100

## 2019-12-01 MED ORDER — SCOPOLAMINE 1 MG/3DAYS TD PT72
1.0000 | MEDICATED_PATCH | Freq: Once | TRANSDERMAL | Status: DC
  Administered 2019-12-01: 1.5 mg via TRANSDERMAL
  Filled 2019-12-01: qty 1

## 2019-12-01 SURGICAL SUPPLY — 49 items
APPLIER CLIP ROT 10 11.4 M/L (STAPLE)
BENZOIN TINCTURE PRP APPL 2/3 (GAUZE/BANDAGES/DRESSINGS) ×3 IMPLANT
BNDG ADH 1X3 SHEER STRL LF (GAUZE/BANDAGES/DRESSINGS) ×12 IMPLANT
CABLE HIGH FREQUENCY MONO STRZ (ELECTRODE) ×3 IMPLANT
CATH CHOLANG 76X19 KUMAR (CATHETERS) IMPLANT
CHLORAPREP W/TINT 26 (MISCELLANEOUS) ×3 IMPLANT
CLIP APPLIE ROT 10 11.4 M/L (STAPLE) IMPLANT
CLIP VESOLOCK LG 6/CT PURPLE (CLIP) IMPLANT
CLIP VESOLOCK MED LG 6/CT (CLIP) ×6 IMPLANT
CLOSURE WOUND 1/2 X4 (GAUZE/BANDAGES/DRESSINGS) ×1
COVER MAYO STAND STRL (DRAPES) ×3 IMPLANT
COVER WAND RF STERILE (DRAPES) ×3 IMPLANT
DECANTER SPIKE VIAL GLASS SM (MISCELLANEOUS) ×3 IMPLANT
DERMABOND ADVANCED (GAUZE/BANDAGES/DRESSINGS) ×2
DERMABOND ADVANCED .7 DNX12 (GAUZE/BANDAGES/DRESSINGS) ×1 IMPLANT
DRAIN CHANNEL 19F RND (DRAIN) IMPLANT
DRAPE C-ARM 42X120 X-RAY (DRAPES) ×3 IMPLANT
ELECT REM PT RETURN 9FT ADLT (ELECTROSURGICAL) ×3
ELECTRODE REM PT RTRN 9FT ADLT (ELECTROSURGICAL) ×1 IMPLANT
EVACUATOR SILICONE 100CC (DRAIN) IMPLANT
GLOVE BIOGEL PI IND STRL 7.0 (GLOVE) ×1 IMPLANT
GLOVE BIOGEL PI INDICATOR 7.0 (GLOVE) ×2
GLOVE SURG SS PI 7.0 STRL IVOR (GLOVE) ×3 IMPLANT
GOWN STRL REUS W/TWL LRG LVL3 (GOWN DISPOSABLE) ×3 IMPLANT
GRASPER SUT TROCAR 14GX15 (MISCELLANEOUS) ×3 IMPLANT
HEMOSTAT SNOW SURGICEL 2X4 (HEMOSTASIS) IMPLANT
IRRIG SUCT STRYKERFLOW 2 WTIP (MISCELLANEOUS) ×3
IRRIGATION SUCT STRKRFLW 2 WTP (MISCELLANEOUS) ×1 IMPLANT
IV CATH 14GX2 1/4 (CATHETERS) IMPLANT
KIT TURNOVER CYSTO (KITS) ×3 IMPLANT
NS IRRIG 500ML POUR BTL (IV SOLUTION) ×3 IMPLANT
PACK BASIN DAY SURGERY FS (CUSTOM PROCEDURE TRAY) ×3 IMPLANT
PENCIL SMOKE EVACUATOR (MISCELLANEOUS) IMPLANT
POUCH RETRIEVAL ECOSAC 10 (ENDOMECHANICALS) ×1 IMPLANT
POUCH RETRIEVAL ECOSAC 10MM (ENDOMECHANICALS) ×3
SCISSORS LAP 5X35 DISP (ENDOMECHANICALS) ×3 IMPLANT
SET TUBE SMOKE EVAC HIGH FLOW (TUBING) ×3 IMPLANT
STOPCOCK 4 WAY LG BORE MALE ST (IV SETS) IMPLANT
STRIP CLOSURE SKIN 1/2X4 (GAUZE/BANDAGES/DRESSINGS) ×2 IMPLANT
SUT ETHILON 2 0 PS N (SUTURE) IMPLANT
SUT MNCRL AB 4-0 PS2 18 (SUTURE) ×3 IMPLANT
SUT VICRYL 0 ENDOLOOP (SUTURE) IMPLANT
SUT VICRYL 0 UR6 27IN ABS (SUTURE) IMPLANT
TOWEL OR 17X26 10 PK STRL BLUE (TOWEL DISPOSABLE) ×3 IMPLANT
TRAY LAPAROSCOPIC (CUSTOM PROCEDURE TRAY) ×3 IMPLANT
TROCAR BLADELESS OPT 12M 100M (ENDOMECHANICALS) IMPLANT
TROCAR BLADELESS OPT 5 100 (ENDOMECHANICALS) ×9 IMPLANT
TROCAR XCEL NON-BLD 11X100MML (ENDOMECHANICALS) ×3 IMPLANT
WARMER LAPAROSCOPE (MISCELLANEOUS) ×3 IMPLANT

## 2019-12-01 NOTE — H&P (Signed)
Kyle Davidson is an 69 y.o. male.   Chief Complaint: abdominal pain HPI: 69 yo male with intermittent epigastric abdominal pain. He was in the ER once for the pain. He underwent imaging showing gallstones.  Past Medical History:  Diagnosis Date  . Anal fistula   . Arthritis   . BPH (benign prostatic hyperplasia)   . GERD (gastroesophageal reflux disease)   . Heart murmur    when he was younger, never had issues,   . Hiatal hernia   . History of basal cell carcinoma excision    nose and face  . History of esophageal dilatation    10-25-2015  . History of kidney stones    past hx. "was told presntly has one in right kidny-not bothersome.  Marland Kitchen History of urinary retention   . History of vertebral fracture    neck x2 area's per pt  . Lower urinary tract symptoms (LUTS)   . PONV (postoperative nausea and vomiting)    occ  . Rash    foot  . UTI (urinary tract infection)     Past Surgical History:  Procedure Laterality Date  . ANTERIOR CERVICAL DECOMP/DISCECTOMY FUSION  02/23/2019  . ANTERIOR CERVICAL DECOMP/DISCECTOMY FUSION N/A 02/23/2019   Procedure: ANTERIOR CERVICAL DECOMPRESSION/DISCECTOMY FUSION C6-7;  Surgeon: Melina Schools, MD;  Location: [place];  Service: Orthopedics;  Laterality: N/A;  3 hrs  . CARDIOVASCULAR STRESS TEST  02-20-2014   Low risk lexiscan nuclear study w/ no reversible ischemia/  normal LV function and wall motion, ef 67%  . CYSTOSCOPY WITH RETROGRADE PYELOGRAM, URETEROSCOPY AND STENT PLACEMENT Right 04/07/2018   Procedure: CYSTOSCOPY WITH RETROGRADE PYELOGRAM, URETEROSCOPY AND STONE EXTRACTION;  Surgeon: Raynelle Bring, MD;  [facility];  Service: Urology;  Laterality: Right;  . ESOPHAGOGASTRODUODENOSCOPY (EGD) WITH PROPOFOL N/A 11/19/2015   Procedure: ESOPHAGOGASTRODUODENOSCOPY (EGD) WITH PROPOFOL;  Surgeon: Laurence Spates, MD;  [facility];  Service: Endoscopy;  Laterality: N/A;  . EXTRACORPOREAL SHOCK WAVE LITHOTRIPSY Right 12-10-2015  .  EXTRACORPOREAL SHOCK WAVE LITHOTRIPSY Right 03/11/2018   Procedure: RIGHT EXTRACORPOREAL SHOCK WAVE LITHOTRIPSY (ESWL);  Surgeon: Raynelle Bring, MD;  [facility];  Service: Urology;  Laterality: Right;  . FISTULOTOMY N/A 03/21/2016   Procedure: FISTULOTOMY;  Surgeon: Leighton Ruff, MD;  [facility];  Service: General;  Laterality: N/A;  . HAND SURGERY  x2  2008   repair left thumb injury  . INGUINAL HERNIA REPAIR Bilateral 08/16/2013   Procedure: LAPAROSCOPIC BILATERAL INGUINAL HERNIA WITH UMBILICAL HERNIA;  Surgeon: Joyice Faster. Cornett, MD;  Location: [place];  Service: General;  Laterality: Bilateral;  . INSERTION OF MESH Bilateral 08/16/2013   Procedure: INSERTION OF MESH;  Surgeon: Joyice Faster. Cornett, MD;  Location: [place];  Service: General;  Laterality: Bilateral;  . LUMBAR FUSION  x2  last one 1997 (approx)  . NASAL SEPTUM SURGERY  1980's  . SAVORY DILATION N/A 11/19/2015   Procedure: SAVORY DILATION;  Surgeon: Laurence Spates, MD;  [facility];  Service: Endoscopy;  Laterality: N/A;  . SHOULDER ARTHROSCOPY W/ ROTATOR CUFF REPAIR Bilateral right 2013/  left 1990's   and Debridement labral tear  . TRANSTHORACIC ECHOCARDIOGRAM  02-20-2014   normal LV function and wall motion , ef 50-55%/  mild dilated ascending aorta/  trivial MR and TR/  mild LAE    Family History  Problem Relation Age of Onset  . Heart disease Mother        No details  . Bladder Cancer Father   .  Prostate cancer Father   . Heart disease Father        MI at later age  . Heart attack Father   . Arrhythmia Brother        Had to be cardioverted   Social History:  reports that he has never smoked. He has never used smokeless tobacco. He reports that he does not drink alcohol or use drugs.  Allergies:  Allergies  Allergen Reactions  . Codeine Other (See Comments)    Bad headaches.....makes him see things    Medications Prior to Admission  Medication Sig Dispense Refill  .  acetaminophen (TYLENOL) 500 MG tablet Take 1,000 mg by mouth every 6 (six) hours as needed for mild pain.    Marland Kitchen aspirin EC 81 MG tablet Take 81 mg by mouth daily.    . Omega-3 Fatty Acids (FISH OIL) 1000 MG CAPS Take 2,000 mg by mouth daily.    . pantoprazole (PROTONIX) 40 MG tablet Take 40 mg by mouth 2 (two) times daily.      Results for orders placed or performed during the hospital encounter of 12/01/19 (from the past 48 hour(s))  CBC WITH DIFFERENTIAL     Status: Abnormal   Collection Time: 12/01/19 12:07 PM  Result Value Ref Range   WBC 6.5 4.0 - 10.5 K/uL   RBC 4.94 4.22 - 5.81 MIL/uL   Hemoglobin 16.3 13.0 - 17.0 g/dL   HCT 47.0 39.0 - 52.0 %   MCV 95.1 80.0 - 100.0 fL   MCH 33.0 26.0 - 34.0 pg   MCHC 34.7 30.0 - 36.0 g/dL   RDW 13.8 11.5 - 15.5 %   Platelets 133 (L) 150 - 400 K/uL   nRBC 0.0 0.0 - 0.2 %   Neutrophils Relative % 54 %   Neutro Abs 3.5 1.7 - 7.7 K/uL   Lymphocytes Relative 36 %   Lymphs Abs 2.4 0.7 - 4.0 K/uL   Monocytes Relative 8 %   Monocytes Absolute 0.5 0.1 - 1.0 K/uL   Eosinophils Relative 1 %   Eosinophils Absolute 0.1 0.0 - 0.5 K/uL   Basophils Relative 1 %   Basophils Absolute 0.0 0.0 - 0.1 K/uL   Immature Granulocytes 0 %   Abs Immature Granulocytes 0.02 0.00 - 0.07 K/uL    Comment: Performed at [facility], [place] [address], [place], [place] [id number]  Basic metabolic panel     Status: Abnormal   Collection Time: 12/01/19  1:15 PM  Result Value Ref Range   Sodium 143 135 - 145 mmol/L   Potassium 3.6 3.5 - 5.1 mmol/L   Chloride 109 98 - 111 mmol/L   CO2 23 22 - 32 mmol/L   Glucose, Bld 104 (H) 70 - 99 mg/dL    Comment: Glucose reference range applies only to samples taken after fasting for at least 8 hours.   BUN 10 8 - 23 mg/dL   Creatinine, Ser 0.63 0.61 - 1.24 mg/dL   Calcium 9.2 8.9 - 10.3 mg/dL   GFR calc non Af Amer >60 >60 mL/min   GFR calc Af Amer >60 >60 mL/min   Anion gap 11 5 - 15    Comment: Performed at  [facility], [place] [address] [place], [place] [address]   No results found.  Review of Systems  Constitutional: Negative for chills and fever.  HENT: Negative for hearing loss.   Respiratory: Negative for cough.   Cardiovascular: Negative for chest pain and palpitations.  Gastrointestinal: Positive for abdominal pain and nausea. Negative for vomiting.  Genitourinary: Negative for dysuria and urgency.  Musculoskeletal: Negative for myalgias and neck pain.  Skin: Negative for rash.  Neurological: Negative for dizziness and headaches.  Hematological: Does not bruise/bleed easily.  Psychiatric/Behavioral: Negative for suicidal ideas.    Blood pressure (!) 165/93, pulse 69, temperature 97.8 F (36.6 C), temperature source Oral, resp. rate 16, height 6\' 3"  (1.905 m), weight 91.8 kg, SpO2 99 %. Physical Exam  Nursing note and vitals reviewed. Constitutional: He is oriented to person, place, and time. He appears well-developed and well-nourished.  HENT:  Head: Normocephalic and atraumatic.  Eyes: Conjunctivae and EOM are normal. No scleral icterus.  Cardiovascular: Normal rate and regular rhythm.  Respiratory: Effort normal and breath sounds normal. He has no wheezes. He has no rales. He exhibits no tenderness.  GI: Soft. He exhibits no distension. There is no abdominal tenderness. There is no rebound.  Musculoskeletal:        General: No edema. Normal range of motion.     Cervical back: Normal range of motion and neck supple.  Neurological: He is alert and oriented to person, place, and time.  Skin: Skin is warm and dry.  Psychiatric: He has a normal mood and affect. His behavior is normal.     Assessment/Plan 69 yo male with chornic calculous cholecystitis -lap chole -planned outpatient procedure  Mickeal Skinner, MD 12/01/2019, 2:16 PM

## 2019-12-01 NOTE — Op Note (Signed)
PATIENT:  Stephenson T Julien Girt  69 y.o. male  PRE-OPERATIVE DIAGNOSIS:  CHRONIC CALCULOUS CHOLECYSTITIS  POST-OPERATIVE DIAGNOSIS:  CHRONIC CALCULOUS CHOLECYSTITIS  PROCEDURE:  Procedure(s): LAPAROSCOPIC CHOLECYSTECTOMY   SURGEON:  Surgeon(s): Scott Vanderveer, Arta Bruce, MD  ASSISTANT: none  ANESTHESIA:   local and general  Indications for procedure: Bhavesh T Corwell is a 69 y.o. male with symptoms of Abdominal pain and Nausea and vomiting consistent with gallbladder disease, Confirmed by Ultrasound.  Description of procedure: The patient was brought into the operative suite, placed supine. Anesthesia was administered with endotracheal tube. Patient was strapped in place and foot board was secured. All pressure points were offloaded by foam padding. The patient was prepped and draped in the usual sterile fashion.  A small incision was made to the right of the umbilicus. A 2mm trocar was inserted into the peritoneal cavity with optical entry. Pneumoperitoneum was applied with high flow low pressure. 2 47mm trocars were placed in the RUQ. A 32mm trocar was placed in the subxiphoid space. Marcaine was infused to the subxiphoid space and lateral upper right abdomen in the transversus abdominis plane. Next the patient was placed in reverse trendelenberg. The gallbladder had omentum adhered to the fundus and wall. This was taken down with cautery.  The gallbladder was retracted cephalad and lateral. The peritoneum was reflected off the infundibulum working lateral to medial. The cystic duct and cystic artery were identified and further dissection revealed a critical view.  The cystic duct and cystic artery were doubly clipped and ligated.   The gallbladder was removed off the liver bed with cautery. The Gallbladder was placed in a specimen bag. The gallbladder fossa was irrigated and hemostasis was applied with cautery. The gallbladder was removed via the 49mm trocar. The rest of the abdomen was inspected, the  appendix was identified and looked normal. The R inguinal mesh was identified and was in good position. The fascial defect was closed with interrupted 0 vicryl suture via laparoscopic trans-fascial suture passer. Pneumoperitoneum was removed, all trocar were removed. All incisions were closed with 4-0 monocryl subcuticular stitch. The patient woke from anesthesia and was brought to PACU in stable condition. All counts were correct  Findings: chronic calculous cholecystitis  Specimen: gallbladder  Blood loss: 10 ml  Local anesthesia: 30 ml marcaine  Complications: none  PLAN OF CARE: Discharge to home after PACU  PATIENT DISPOSITION:  PACU - hemodynamically stable.  Images:     Gurney Maxin, M.D. General, Bariatric, & Minimally Invasive Surgery [facility]

## 2019-12-01 NOTE — Anesthesia Procedure Notes (Signed)
Procedure Name: Intubation Date/Time: 12/01/2019 2:28 PM Performed by: Suan Halter, CRNA Pre-anesthesia Checklist: Patient identified, Emergency Drugs available, Suction available and Patient being monitored Patient Re-evaluated:Patient Re-evaluated prior to induction Oxygen Delivery Method: Circle system utilized Preoxygenation: Pre-oxygenation with 100% oxygen Induction Type: IV induction Ventilation: Mask ventilation without difficulty Laryngoscope Size: Mac and 4 Grade View: Grade I Tube type: Oral Tube size: 7.0 mm Number of attempts: 1 Airway Equipment and Method: Stylet and Oral airway Placement Confirmation: ETT inserted through vocal cords under direct vision,  positive ETCO2 and breath sounds checked- equal and bilateral Secured at: 23 cm Tube secured with: Tape Dental Injury: Teeth and Oropharynx as per pre-operative assessment

## 2019-12-01 NOTE — Anesthesia Postprocedure Evaluation (Signed)
Anesthesia Post Note  Patient: Kyle Davidson  Procedure(s) Performed: LAPAROSCOPIC CHOLECYSTECTOMY (N/A Abdomen)     Patient location during evaluation: PACU Anesthesia Type: General Level of consciousness: awake and alert, patient cooperative and oriented Pain management: pain level controlled (pain improving) Vital Signs Assessment: post-procedure vital signs reviewed and stable Respiratory status: spontaneous breathing, nonlabored ventilation and respiratory function stable Cardiovascular status: blood pressure returned to baseline and stable Postop Assessment: no apparent nausea or vomiting Anesthetic complications: no    Last Vitals:  Vitals:   12/01/19 1622 12/01/19 1630  BP:  (!) 159/75  Pulse: 66 63  Resp: 11 12  Temp:    SpO2: 90% 96%    Last Pain:  Vitals:   12/01/19 1617  TempSrc:   PainSc: 6                  Dannia Snook,E. Dniya Neuhaus

## 2019-12-01 NOTE — Discharge Instructions (Signed)
[facility] ______[facility] LAPAROSCOPIC SURGERY: POST OP INSTRUCTIONS Always review your discharge instruction sheet given to you by the facility where your surgery was performed. IF YOU HAVE DISABILITY OR FAMILY LEAVE FORMS, YOU MUST BRING THEM TO THE OFFICE FOR PROCESSING.   DO NOT GIVE THEM TO YOUR DOCTOR.  1. A prescription for pain medication may be given to you upon discharge.  Take your pain medication as prescribed, if needed.  If narcotic pain medicine is not needed, then you may take acetaminophen (Tylenol) or ibuprofen (Advil) as needed. 2. Take your usually prescribed medications unless otherwise directed. 3. If you need a refill on your pain medication, please contact your pharmacy.  They will contact our office to request authorization. Prescriptions will not be filled after 5pm or on week-ends. 4. You should follow a light diet the first few days after arrival home, such as soup and crackers, etc.  Be sure to include lots of fluids daily. 5. Most patients will experience some swelling and bruising in the area of the incisions.  Ice packs will help.  Swelling and bruising can take several days to resolve.  6. It is common to experience some constipation if taking pain medication after surgery.  Increasing fluid intake and taking a stool softener (such as Colace) will usually help or prevent this problem from occurring.  A mild laxative (Milk of Magnesia or Miralax) should be taken according to package instructions if there are no bowel movements after 48 hours. 7. Unless discharge instructions indicate otherwise, you may remove your bandages 24-48 hours after surgery, and you may shower at that time.  You may have steri-strips (small skin tapes) in place directly over the incision.  These strips should be left on the skin for 7-10 days.  If your surgeon used skin glue on the incision, you may shower in 24 hours.  The glue will flake off over the next 2-3 weeks.  Any sutures or  staples will be removed at the office during your follow-up visit. 8. ACTIVITIES:  You may resume regular (light) daily activities beginning the next day--such as daily self-care, walking, climbing stairs--gradually increasing activities as tolerated.  You may have sexual intercourse when it is comfortable.  Refrain from any heavy lifting or straining until approved by your doctor. a. You may drive when you are no longer taking prescription pain medication, you can comfortably wear a seatbelt, and you can safely maneuver your car and apply brakes. b. RETURN TO WORK:  __________________________________________________________ 9. You should see your doctor in the office for a follow-up appointment approximately 2-3 weeks after your surgery.  Make sure that you call for this appointment within a day or two after you arrive home to insure a convenient appointment time. 10. OTHER INSTRUCTIONS: __________________________________________________________________________________________________________________________ __________________________________________________________________________________________________________________________ WHEN TO CALL YOUR DOCTOR: 1. Fever over 101.0 2. Inability to urinate 3. Continued bleeding from incision. 4. Increased pain, redness, or drainage from the incision. 5. Increasing abdominal pain  The clinic staff is available to answer your questions during regular business hours.  Please don't hesitate to call and ask to speak to one of the nurses for clinical concerns.  If you have a medical emergency, go to the nearest emergency room or call 911.  A surgeon from [facility] is always on call at the hospital. [address] [place], [place], [place]  [address] ? [address], [place], [place]   [address] [phone] ? [phone] ? FAX [phone] Web site:  [email or web address]     Post Anesthesia Home Care  Instructions  Activity: Get plenty of rest for the remainder of the day. A responsible individual must stay with you for 24 hours following the procedure.  For the next 24 hours, DO NOT: -Drive a car -Paediatric nurse -Drink alcoholic beverages -Take any medication unless instructed by your physician -Make any legal decisions or sign important papers.  Meals: Start with liquid foods such as gelatin or soup. Progress to regular foods as tolerated. Avoid greasy, spicy, heavy foods. If nausea and/or vomiting occur, drink only clear liquids until the nausea and/or vomiting subsides. Call your physician if vomiting continues.  Special Instructions/Symptoms: Your throat may feel dry or sore from the anesthesia or the breathing tube placed in your throat during surgery. If this causes discomfort, gargle with warm salt water. The discomfort should disappear within 24 hours.  If you had a scopolamine patch placed behind your ear for the management of post- operative nausea and/or vomiting:  1. The medication in the patch is effective for 72 hours, after which it should be removed.  Wrap patch in a tissue and discard in the trash. Wash hands thoroughly with soap and water. 2. You may remove the patch earlier than 72 hours if you experience unpleasant side effects which may include dry mouth, dizziness or visual disturbances. 3. Avoid touching the patch. Wash your hands with soap and water after contact with the patch.  Do not take any Tylenol until after 6:00 pm today.

## 2019-12-01 NOTE — Anesthesia Preprocedure Evaluation (Addendum)
Anesthesia Evaluation  Patient identified by MRN, date of birth, ID band Patient awake    Reviewed: Allergy & Precautions, NPO status , Patient's Chart, lab work & pertinent test results  History of Anesthesia Complications (+) PONV  Airway Mallampati: II  TM Distance: >3 FB Neck ROM: Full    Dental  (+) Dental Advisory Given   Pulmonary  11/01/2019 SARS coronavirus POSITIVE, has been asymptomatic for several weeks   breath sounds clear to auscultation       Cardiovascular + Peripheral Vascular Disease (ascending aorta size upper limits of normal )   Rhythm:Regular Rate:Normal  '15 ECHO: EF 50-55%, valves OK '15 Stress: normal   Neuro/Psych negative neurological ROS     GI/Hepatic Neg liver ROS, GERD  Controlled and Medicated,  Endo/Other  negative endocrine ROS  Renal/GU negative Renal ROS     Musculoskeletal  (+) Arthritis ,   Abdominal   Peds  Hematology negative hematology ROS (+)   Anesthesia Other Findings   Reproductive/Obstetrics                            Anesthesia Physical Anesthesia Plan  ASA: II  Anesthesia Plan: General   Post-op Pain Management:    Induction: Intravenous  PONV Risk Score and Plan: 3 and Ondansetron, Dexamethasone and Scopolamine patch - Pre-op  Airway Management Planned: Oral ETT  Additional Equipment:   Intra-op Plan:   Post-operative Plan: Extubation in OR  Informed Consent: I have reviewed the patients History and Physical, chart, labs and discussed the procedure including the risks, benefits and alternatives for the proposed anesthesia with the patient or authorized representative who has indicated his/her understanding and acceptance.     Dental advisory given  Plan Discussed with: CRNA and Surgeon  Anesthesia Plan Comments:        Anesthesia Quick Evaluation

## 2019-12-01 NOTE — Transfer of Care (Signed)
Immediate Anesthesia Transfer of Care Note  Patient: Kyle Davidson  Procedure(s) Performed: Procedure(s) (LRB): LAPAROSCOPIC CHOLECYSTECTOMY (N/A)  Patient Location: PACU  Anesthesia Type: General  Level of Consciousness: awake, oriented, sedated and patient cooperative  Airway & Oxygen Therapy: Patient Spontanous Breathing and Patient connected to face mask oxygen  Post-op Assessment: Report given to PACU RN and Post -op Vital signs reviewed and stable  Post vital signs: Reviewed and stable  Complications: No apparent anesthesia complications  Last Vitals:  Vitals Value Taken Time  BP 188/95 12/01/19 1530  Temp 36.4 C 12/01/19 1526  Pulse 73 12/01/19 1534  Resp 22 12/01/19 1534  SpO2 99 % 12/01/19 1534  Vitals shown include unvalidated device data.  Last Pain:  Vitals:   12/01/19 1200  TempSrc: Oral  PainSc: 7       Patients Stated Pain Goal: 5 (12/01/19 1200)

## 2019-12-02 LAB — SURGICAL PATHOLOGY

## 2020-01-02 ENCOUNTER — Ambulatory Visit: Payer: Self-pay | Admitting: Orthopedic Surgery

## 2020-01-05 ENCOUNTER — Ambulatory Visit: Payer: Self-pay | Admitting: Orthopedic Surgery
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
## 2020-01-06 NOTE — Pre-Procedure Instructions (Signed)
[id number] - [place], [place] - [address] [place] [address] Phone: [phone] Fax: [phone]      Your procedure is scheduled on Thursday April 22nd.  Report to [facility] Main Entrance "A" at 9:54 A.M., and check in at the Admitting office.  Call this number if you have problems the morning of surgery:  [phone]  Call [phone] if you have any questions prior to your surgery date Monday-Friday 8am-4pm    Remember:  Do not eat or drink after midnight the night before your surgery    Take these medicines the morning of surgery with A SIP OF WATER   pantoprazole (PROTONIX)   acetaminophen (TYLENOL) as needed for pain  oxyCODONE (OXY IR/ROXICODONE) as needed for pain    As of today, STOP taking any Aspirin (unless otherwise instructed by your surgeon) and Aspirin containing products, Aleve, Naproxen, Ibuprofen, Motrin, Advil, Goody's, BC's, all herbal medications, fish oil, and all vitamins.                      Do not wear jewelry, make up, or nail polish            Do not wear lotions, powders, perfumes/colognes, or deodorant.            Do not shave 48 hours prior to surgery.  Men may shave face and neck.            Do not bring valuables to the hospital.            [facility] is not responsible for any belongings or valuables.  Do NOT Smoke (Tobacco/Vapping) or drink Alcohol 24 hours prior to your procedure If you use a CPAP at night, you may bring all equipment for your overnight stay.   Contacts, glasses, dentures or bridgework may not be worn into surgery.      For patients admitted to the hospital, discharge time will be determined by your treatment team.   Patients discharged the day of surgery will not be allowed to drive home, and someone needs to stay with them for 24 hours.    Special instructions:   [facility]- Preparing For Surgery  Before surgery, you can play an  important role. Because skin is not sterile, your skin needs to be as free of germs as possible. You can reduce the number of germs on your skin by washing with CHG (chlorahexidine gluconate) Soap before surgery.  CHG is an antiseptic cleaner which kills germs and bonds with the skin to continue killing germs even after washing.    Oral Hygiene is also important to reduce your risk of infection.  Remember - BRUSH YOUR TEETH THE MORNING OF SURGERY WITH YOUR REGULAR TOOTHPASTE  Please do not use if you have an allergy to CHG or antibacterial soaps. If your skin becomes reddened/irritated stop using the CHG.  Do not shave (including legs and underarms) for at least 48 hours prior to first CHG shower. It is OK to shave your face.  Please follow these instructions carefully.   1. Shower the NIGHT BEFORE SURGERY and the MORNING OF SURGERY with CHG Soap.   2. If you chose to wash your hair, wash your hair first as usual with your normal shampoo.  3. After you shampoo, rinse your hair and body thoroughly to remove the shampoo.  4. Use CHG as you would any other liquid  soap. You can apply CHG directly to the skin and wash gently with a scrungie or a clean washcloth.   5. Apply the CHG Soap to your body ONLY FROM THE NECK DOWN.  Do not use on open wounds or open sores. Avoid contact with your eyes, ears, mouth and genitals (private parts). Wash Face and genitals (private parts)  with your normal soap.   6. Wash thoroughly, paying special attention to the area where your surgery will be performed.  7. Thoroughly rinse your body with warm water from the neck down.  8. DO NOT shower/wash with your normal soap after using and rinsing off the CHG Soap.  9. Pat yourself dry with a CLEAN TOWEL.  10. Wear CLEAN PAJAMAS to bed the night before surgery, wear comfortable clothes the morning of surgery  11. Place CLEAN SHEETS on your bed the night of your first shower and DO NOT SLEEP WITH PETS.   Day of  Surgery:   Do not apply any deodorants/lotions.  Please wear clean clothes to the hospital/surgery center.   Remember to brush your teeth WITH YOUR REGULAR TOOTHPASTE.   Please read over the following fact sheets that you were given.

## 2020-01-09 ENCOUNTER — Encounter (HOSPITAL_COMMUNITY): Payer: Self-pay

## 2020-01-09 ENCOUNTER — Ambulatory Visit (HOSPITAL_COMMUNITY)
Admission: RE | Admit: 2020-01-09 | Discharge: 2020-01-09 | Disposition: A | Discharge status: Home / Self Care | Payer: Worker's Compensation | Source: Ambulatory Visit | Attending: Orthopedic Surgery | Admitting: Orthopedic Surgery

## 2020-01-09 ENCOUNTER — Other Ambulatory Visit: Payer: Self-pay

## 2020-01-09 ENCOUNTER — Encounter (HOSPITAL_COMMUNITY)
Admission: RE | Admit: 2020-01-09 | Discharge: 2020-01-09 | Disposition: A | Discharge status: Home / Self Care | Payer: Worker's Compensation | Source: Ambulatory Visit | Attending: Orthopedic Surgery | Admitting: Orthopedic Surgery

## 2020-01-09 DIAGNOSIS — Z01818 Encounter for other preprocedural examination: Secondary | ICD-10-CM

## 2020-01-09 HISTORY — DX: Prediabetes: R73.03

## 2020-01-09 HISTORY — DX: Headache, unspecified: R51.9

## 2020-01-09 HISTORY — DX: Cerebral infarction, unspecified: I63.9

## 2020-01-09 LAB — APTT: aPTT: 32 seconds (ref 24–36)

## 2020-01-09 LAB — CBC
HCT: 49.8 % (ref 39.0–52.0)
Hemoglobin: 16.6 g/dL (ref 13.0–17.0)
MCH: 32.9 pg (ref 26.0–34.0)
MCHC: 33.3 g/dL (ref 30.0–36.0)
MCV: 98.6 fL (ref 80.0–100.0)
Platelets: 161 10*3/uL (ref 150–400)
RBC: 5.05 MIL/uL (ref 4.22–5.81)
RDW: 13.1 % (ref 11.5–15.5)
WBC: 6.6 10*3/uL (ref 4.0–10.5)
nRBC: 0 % (ref 0.0–0.2)

## 2020-01-09 LAB — URINALYSIS, ROUTINE W REFLEX MICROSCOPIC
Bilirubin Urine: NEGATIVE
Glucose, UA: NEGATIVE mg/dL
Hgb urine dipstick: NEGATIVE
Ketones, ur: NEGATIVE mg/dL
Leukocytes,Ua: NEGATIVE
Nitrite: NEGATIVE
Protein, ur: NEGATIVE mg/dL
Specific Gravity, Urine: 1.01 (ref 1.005–1.030)
pH: 6 (ref 5.0–8.0)

## 2020-01-09 LAB — TYPE AND SCREEN
ABO/RH(D): O POS
Antibody Screen: NEGATIVE

## 2020-01-09 LAB — HEMOGLOBIN A1C
Hgb A1c MFr Bld: 5.1 % (ref 4.8–5.6)
Mean Plasma Glucose: 99.67 mg/dL

## 2020-01-09 LAB — SURGICAL PCR SCREEN
MRSA, PCR: NEGATIVE
Staphylococcus aureus: NEGATIVE

## 2020-01-09 LAB — BASIC METABOLIC PANEL
Anion gap: 9 (ref 5–15)
BUN: 11 mg/dL (ref 8–23)
CO2: 27 mmol/L (ref 22–32)
Calcium: 9.1 mg/dL (ref 8.9–10.3)
Chloride: 107 mmol/L (ref 98–111)
Creatinine, Ser: 0.74 mg/dL (ref 0.61–1.24)
GFR calc Af Amer: >60 mL/min (ref >60)
GFR calc non Af Amer: >60 mL/min (ref >60)
Glucose, Bld: 90 mg/dL (ref 70–99)
Potassium: 3.7 mmol/L (ref 3.5–5.1)
Sodium: 143 mmol/L (ref 135–145)

## 2020-01-09 LAB — PROTIME-INR
INR: 1 (ref 0.8–1.2)
Prothrombin Time: 13.6 seconds (ref 11.4–15.2)

## 2020-01-09 LAB — ABO/RH: ABO/RH(D): O POS

## 2020-01-09 NOTE — Progress Notes (Signed)
PCP - Clyde Lundborg PA-C Cardiologist - Dr. Mertie Moores  PPM/ICD - Denies  Chest x-ray - 01/09/20 EKG - 11/14/19 Stress Test - 02/20/14 ECHO - 02/20/14 Cardiac Cath - Denies  Sleep Study - Denies  Pt states that he may be pre-diabetic.   Blood Thinner Instructions: N/A Aspirin Instructions: LD 01/05/20  ERAS Protcol - No  COVID TEST- No, Pt within 90 day window   Anesthesia review: Yes, per MD order  Patient denies shortness of breath, fever, cough and chest pain at PAT appointment   All instructions explained to the patient, with a verbal understanding of the material. Patient agrees to go over the instructions while at home for a better understanding. Patient also instructed to self quarantine after being tested for COVID-19. The opportunity to ask questions was provided.

## 2020-01-09 NOTE — Pre-Procedure Instructions (Signed)
[id number] - [place], [place] - [address] [place] [address] Phone: [phone] Fax: [phone]      Your procedure is scheduled on Thursday April 22nd.  Report to [facility] Main Entrance "A" at 10:00 A.M., and check in at the Admitting office.  Call this number if you have problems the morning of surgery:  [phone]  Call [phone] if you have any questions prior to your surgery date Monday-Friday 8am-4pm    Remember:  Do not eat or drink after midnight the night before your surgery    Take these medicines the morning of surgery with A SIP OF WATER   pantoprazole (PROTONIX)   acetaminophen (TYLENOL) as needed for pain  Follow your surgeon's instructions on when to stop Aspirin.  If no instructions were given by your surgeon then you will need to call the office to get those instructions.     As of today, STOP taking any Aspirin containing products, Aleve, Naproxen, Ibuprofen, Motrin, Advil, Goody's, BC's, all herbal medications, fish oil, and all vitamins.                      Do not wear jewelry            Do not wear lotions, powders, colognes, or deodorant.            Men may shave face and neck.            Do not bring valuables to the hospital.            [facility] is not responsible for any belongings or valuables.  Do NOT Smoke (Tobacco/Vapping) or drink Alcohol 24 hours prior to your procedure If you use a CPAP at night, you may bring all equipment for your overnight stay.   Contacts, glasses, dentures or bridgework may not be worn into surgery.      For patients admitted to the hospital, discharge time will be determined by your treatment team.   Patients discharged the day of surgery will not be allowed to drive home, and someone needs to stay with them for 24 hours.    Special instructions:   [place]- Preparing For Surgery  Before surgery, you can play an important  role. Because skin is not sterile, your skin needs to be as free of germs as possible. You can reduce the number of germs on your skin by washing with CHG (chlorahexidine gluconate) Soap before surgery.  CHG is an antiseptic cleaner which kills germs and bonds with the skin to continue killing germs even after washing.    Oral Hygiene is also important to reduce your risk of infection.  Remember - BRUSH YOUR TEETH THE MORNING OF SURGERY WITH YOUR REGULAR TOOTHPASTE  Please do not use if you have an allergy to CHG or antibacterial soaps. If your skin becomes reddened/irritated stop using the CHG.  Do not shave (including legs and underarms) for at least 48 hours prior to first CHG shower. It is OK to shave your face.  Please follow these instructions carefully.   1. Shower the NIGHT BEFORE SURGERY and the MORNING OF SURGERY with CHG Soap.   2. If you chose to wash your hair, wash your hair first as usual with your normal shampoo.  3. After you shampoo, rinse your hair and body thoroughly to remove the shampoo.  4. Use CHG as you would any  other liquid soap. You can apply CHG directly to the skin and wash gently with a scrungie or a clean washcloth.   5. Apply the CHG Soap to your body ONLY FROM THE NECK DOWN.  Do not use on open wounds or open sores. Avoid contact with your eyes, ears, mouth and genitals (private parts). Wash Face and genitals (private parts)  with your normal soap.   6. Wash thoroughly, paying special attention to the area where your surgery will be performed.  7. Thoroughly rinse your body with warm water from the neck down.  8. DO NOT shower/wash with your normal soap after using and rinsing off the CHG Soap.  9. Pat yourself dry with a CLEAN TOWEL.  10. Wear CLEAN PAJAMAS to bed the night before surgery, wear comfortable clothes the morning of surgery  11. Place CLEAN SHEETS on your bed the night of your first shower and DO NOT SLEEP WITH PETS.   Day of Surgery:    Do not apply any deodorants/lotions.  Please wear clean clothes to the hospital/surgery center.   Remember to brush your teeth WITH YOUR REGULAR TOOTHPASTE.   Please read over the following fact sheets that you were given.
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
## 2020-01-12 ENCOUNTER — Other Ambulatory Visit: Payer: Self-pay

## 2020-01-12 ENCOUNTER — Ambulatory Visit (HOSPITAL_COMMUNITY): Payer: Worker's Compensation | Admitting: Physician Assistant

## 2020-01-12 ENCOUNTER — Ambulatory Visit (HOSPITAL_COMMUNITY): Payer: Worker's Compensation

## 2020-01-12 ENCOUNTER — Ambulatory Visit (HOSPITAL_COMMUNITY): Payer: Worker's Compensation | Admitting: Anesthesiology

## 2020-01-12 ENCOUNTER — Encounter (HOSPITAL_COMMUNITY)
Admission: RE | Disposition: A | Discharge status: Home / Self Care | Payer: Self-pay | Source: Home / Self Care | Attending: Orthopedic Surgery

## 2020-01-12 ENCOUNTER — Encounter (HOSPITAL_COMMUNITY): Payer: Self-pay | Admitting: Orthopedic Surgery

## 2020-01-12 ENCOUNTER — Observation Stay (HOSPITAL_COMMUNITY)
Admission: RE | Admit: 2020-01-12 | Discharge: 2020-01-14 | Disposition: A | Discharge status: Home / Self Care | Payer: Worker's Compensation | Attending: Orthopedic Surgery | Admitting: Orthopedic Surgery

## 2020-01-12 DIAGNOSIS — I10 Essential (primary) hypertension: Secondary | ICD-10-CM | POA: Diagnosis not present

## 2020-01-12 DIAGNOSIS — M961 Postlaminectomy syndrome, not elsewhere classified: Secondary | ICD-10-CM | POA: Insufficient documentation

## 2020-01-12 DIAGNOSIS — F329 Major depressive disorder, single episode, unspecified: Secondary | ICD-10-CM | POA: Diagnosis not present

## 2020-01-12 DIAGNOSIS — Z419 Encounter for procedure for purposes other than remedying health state, unspecified: Secondary | ICD-10-CM

## 2020-01-12 DIAGNOSIS — M5136 Other intervertebral disc degeneration, lumbar region: Principal | ICD-10-CM | POA: Insufficient documentation

## 2020-01-12 DIAGNOSIS — Z8673 Personal history of transient ischemic attack (TIA), and cerebral infarction without residual deficits: Secondary | ICD-10-CM | POA: Diagnosis not present

## 2020-01-12 DIAGNOSIS — Z7982 Long term (current) use of aspirin: Secondary | ICD-10-CM | POA: Diagnosis not present

## 2020-01-12 DIAGNOSIS — Z79899 Other long term (current) drug therapy: Secondary | ICD-10-CM | POA: Insufficient documentation

## 2020-01-12 DIAGNOSIS — M1990 Unspecified osteoarthritis, unspecified site: Secondary | ICD-10-CM | POA: Diagnosis not present

## 2020-01-12 DIAGNOSIS — Z981 Arthrodesis status: Secondary | ICD-10-CM

## 2020-01-12 DIAGNOSIS — K219 Gastro-esophageal reflux disease without esophagitis: Secondary | ICD-10-CM | POA: Diagnosis not present

## 2020-01-12 HISTORY — PX: ANTERIOR LAT LUMBAR FUSION: SHX1168

## 2020-01-12 SURGERY — ANTERIOR LATERAL LUMBAR FUSION 1 LEVEL
Anesthesia: Regional

## 2020-01-12 MED ORDER — DEXAMETHASONE SODIUM PHOSPHATE 4 MG/ML IJ SOLN
INTRAMUSCULAR | Status: DC | PRN
  Administered 2020-01-12: 10 mg via INTRAVENOUS

## 2020-01-12 MED ORDER — OXYCODONE HCL 5 MG/5ML PO SOLN: 5.0000 mg | Freq: Once | ORAL | Status: AC | PRN

## 2020-01-12 MED ORDER — TRANEXAMIC ACID-NACL 1000-0.7 MG/100ML-% IV SOLN
INTRAVENOUS | Status: AC
  Filled 2020-01-12: qty 100

## 2020-01-12 MED ORDER — LIDOCAINE HCL 4 % MT SOLN
OROMUCOSAL | Status: DC | PRN
  Administered 2020-01-12: 4 mL via TOPICAL

## 2020-01-12 MED ORDER — ACETAMINOPHEN 10 MG/ML IV SOLN
INTRAVENOUS | Status: DC | PRN
  Administered 2020-01-12: 1000 mg via INTRAVENOUS

## 2020-01-12 MED ORDER — PHENYLEPHRINE 40 MCG/ML (10ML) SYRINGE FOR IV PUSH (FOR BLOOD PRESSURE SUPPORT)
PREFILLED_SYRINGE | INTRAVENOUS | Status: DC | PRN
  Administered 2020-01-12: 80 ug via INTRAVENOUS

## 2020-01-12 MED ORDER — THROMBIN 20000 UNITS EX KIT
PACK | CUTANEOUS | Status: AC
  Filled 2020-01-12: qty 1

## 2020-01-12 MED ORDER — ONDANSETRON HCL 4 MG PO TABS: 4.0000 mg | ORAL_TABLET | Freq: Four times a day (QID) | ORAL | Status: DC | PRN

## 2020-01-12 MED ORDER — MORPHINE SULFATE (PF) 2 MG/ML IV SOLN: 2.0000 mg | INTRAVENOUS | Status: DC | PRN

## 2020-01-12 MED ORDER — SODIUM CHLORIDE 0.9% FLUSH
3.0000 mL | Freq: Two times a day (BID) | INTRAVENOUS | Status: DC
  Administered 2020-01-13 (×2): 3 mL via INTRAVENOUS

## 2020-01-12 MED ORDER — CEFAZOLIN SODIUM-DEXTROSE 2-4 GM/100ML-% IV SOLN
INTRAVENOUS | Status: AC
  Filled 2020-01-12: qty 100

## 2020-01-12 MED ORDER — MIDAZOLAM HCL 5 MG/5ML IJ SOLN
INTRAMUSCULAR | Status: DC | PRN
  Administered 2020-01-12: 1 mg via INTRAVENOUS

## 2020-01-12 MED ORDER — MENTHOL 3 MG MT LOZG
1.0000 | LOZENGE | OROMUCOSAL | Status: DC | PRN
  Filled 2020-01-12: qty 9

## 2020-01-12 MED ORDER — SCOPOLAMINE 1 MG/3DAYS TD PT72: 1.0000 | MEDICATED_PATCH | TRANSDERMAL | Status: DC

## 2020-01-12 MED ORDER — OXYCODONE HCL 5 MG PO TABS
5.0000 mg | ORAL_TABLET | Freq: Once | ORAL | Status: AC | PRN
  Administered 2020-01-12: 5 mg via ORAL

## 2020-01-12 MED ORDER — FLEET ENEMA 7-19 GM/118ML RE ENEM: 1.0000 | ENEMA | Freq: Once | RECTAL | Status: DC | PRN

## 2020-01-12 MED ORDER — OXYCODONE HCL 5 MG PO TABS
5.0000 mg | ORAL_TABLET | ORAL | Status: DC | PRN
  Administered 2020-01-14: 5 mg via ORAL
  Filled 2020-01-12: qty 1

## 2020-01-12 MED ORDER — GLYCOPYRROLATE PF 0.2 MG/ML IJ SOSY
PREFILLED_SYRINGE | INTRAMUSCULAR | Status: AC
  Filled 2020-01-12: qty 1

## 2020-01-12 MED ORDER — POLYETHYLENE GLYCOL 3350 17 G PO PACK
17.0000 g | PACK | Freq: Every day | ORAL | Status: DC | PRN
  Administered 2020-01-13: 17 g via ORAL
  Filled 2020-01-12: qty 1

## 2020-01-12 MED ORDER — CEFAZOLIN SODIUM-DEXTROSE 2-4 GM/100ML-% IV SOLN
2.0000 g | INTRAVENOUS | Status: AC
  Administered 2020-01-12 (×2): 2 g via INTRAVENOUS

## 2020-01-12 MED ORDER — SCOPOLAMINE 1 MG/3DAYS TD PT72
MEDICATED_PATCH | TRANSDERMAL | Status: AC
  Administered 2020-01-12: 1.5 mg via TRANSDERMAL
  Filled 2020-01-12: qty 1

## 2020-01-12 MED ORDER — EPINEPHRINE PF 1 MG/ML IJ SOLN
INTRAMUSCULAR | Status: DC | PRN
  Administered 2020-01-12: .15 mL

## 2020-01-12 MED ORDER — PHENOL 1.4 % MT LIQD
1.0000 | OROMUCOSAL | Status: DC | PRN
  Administered 2020-01-13: 1 via OROMUCOSAL
  Filled 2020-01-12: qty 177

## 2020-01-12 MED ORDER — TRANEXAMIC ACID 1000 MG/10ML IV SOLN
INTRAVENOUS | Status: DC | PRN
  Administered 2020-01-12: 1000 mg via INTRAVENOUS

## 2020-01-12 MED ORDER — THROMBIN 20000 UNITS EX SOLR: CUTANEOUS | Status: DC | PRN

## 2020-01-12 MED ORDER — ACETAMINOPHEN 325 MG PO TABS
650.0000 mg | ORAL_TABLET | ORAL | Status: DC | PRN
  Administered 2020-01-12 – 2020-01-13 (×2): 650 mg via ORAL
  Filled 2020-01-12 (×2): qty 2

## 2020-01-12 MED ORDER — PHENYLEPHRINE HCL-NACL 10-0.9 MG/250ML-% IV SOLN
INTRAVENOUS | Status: DC | PRN
  Administered 2020-01-12: 60 ug/min via INTRAVENOUS
  Administered 2020-01-12: 30 ug/min via INTRAVENOUS

## 2020-01-12 MED ORDER — ONDANSETRON HCL 4 MG/2ML IJ SOLN
INTRAMUSCULAR | Status: AC
  Filled 2020-01-12: qty 2

## 2020-01-12 MED ORDER — SODIUM CHLORIDE 0.9 % IV SOLN: 250.0000 mL | INTRAVENOUS | Status: DC

## 2020-01-12 MED ORDER — ONDANSETRON HCL 4 MG PO TABS: 4.0000 mg | ORAL_TABLET | Freq: Three times a day (TID) | ORAL | 0 refills | Status: DC | PRN

## 2020-01-12 MED ORDER — MIDAZOLAM HCL 2 MG/2ML IJ SOLN
INTRAMUSCULAR | Status: AC
  Administered 2020-01-12: 2 mg via INTRAVENOUS
  Filled 2020-01-12: qty 2

## 2020-01-12 MED ORDER — LIDOCAINE 2% (20 MG/ML) 5 ML SYRINGE
INTRAMUSCULAR | Status: AC
  Filled 2020-01-12: qty 5

## 2020-01-12 MED ORDER — ACETAMINOPHEN 650 MG RE SUPP: 650.0000 mg | RECTAL | Status: DC | PRN

## 2020-01-12 MED ORDER — MEPERIDINE HCL 25 MG/ML IJ SOLN
INTRAMUSCULAR | Status: AC
  Filled 2020-01-12: qty 1

## 2020-01-12 MED ORDER — FENTANYL CITRATE (PF) 250 MCG/5ML IJ SOLN
INTRAMUSCULAR | Status: AC
  Filled 2020-01-12: qty 5

## 2020-01-12 MED ORDER — MIDAZOLAM HCL 2 MG/2ML IJ SOLN
INTRAMUSCULAR | Status: AC
  Filled 2020-01-12: qty 2

## 2020-01-12 MED ORDER — DOCUSATE SODIUM 100 MG PO CAPS
100.0000 mg | ORAL_CAPSULE | Freq: Two times a day (BID) | ORAL | Status: DC
  Administered 2020-01-12 – 2020-01-14 (×4): 100 mg via ORAL
  Filled 2020-01-12 (×4): qty 1

## 2020-01-12 MED ORDER — PHENYLEPHRINE 40 MCG/ML (10ML) SYRINGE FOR IV PUSH (FOR BLOOD PRESSURE SUPPORT)
PREFILLED_SYRINGE | INTRAVENOUS | Status: AC
  Filled 2020-01-12: qty 10

## 2020-01-12 MED ORDER — PROPOFOL 500 MG/50ML IV EMUL
INTRAVENOUS | Status: DC | PRN
  Administered 2020-01-12 (×2): 75 ug/kg/min via INTRAVENOUS

## 2020-01-12 MED ORDER — LACTATED RINGERS IV SOLN: INTRAVENOUS | Status: DC

## 2020-01-12 MED ORDER — PROPOFOL 10 MG/ML IV BOLUS
INTRAVENOUS | Status: AC
  Filled 2020-01-12: qty 20

## 2020-01-12 MED ORDER — ACETAMINOPHEN 325 MG PO TABS: 325.0000 mg | ORAL_TABLET | ORAL | Status: DC | PRN

## 2020-01-12 MED ORDER — GLYCOPYRROLATE PF 0.2 MG/ML IJ SOSY
PREFILLED_SYRINGE | INTRAMUSCULAR | Status: DC | PRN
  Administered 2020-01-12 (×2): .1 mg via INTRAVENOUS

## 2020-01-12 MED ORDER — CEFAZOLIN SODIUM 1 G IJ SOLR
INTRAMUSCULAR | Status: AC
  Filled 2020-01-12: qty 20

## 2020-01-12 MED ORDER — FENTANYL CITRATE (PF) 100 MCG/2ML IJ SOLN
INTRAMUSCULAR | Status: DC | PRN
  Administered 2020-01-12 (×3): 50 ug via INTRAVENOUS
  Administered 2020-01-12: 100 ug via INTRAVENOUS
  Administered 2020-01-12 (×3): 50 ug via INTRAVENOUS

## 2020-01-12 MED ORDER — 0.9 % SODIUM CHLORIDE (POUR BTL) OPTIME
TOPICAL | Status: DC | PRN
  Administered 2020-01-12: 1000 mL

## 2020-01-12 MED ORDER — OXYCODONE HCL 5 MG PO TABS
ORAL_TABLET | ORAL | Status: AC
  Filled 2020-01-12: qty 1

## 2020-01-12 MED ORDER — FENTANYL CITRATE (PF) 100 MCG/2ML IJ SOLN
INTRAMUSCULAR | Status: AC
  Filled 2020-01-12: qty 2

## 2020-01-12 MED ORDER — BISACODYL 5 MG PO TBEC: 5.0000 mg | DELAYED_RELEASE_TABLET | Freq: Every day | ORAL | Status: DC | PRN

## 2020-01-12 MED ORDER — SUCCINYLCHOLINE CHLORIDE 200 MG/10ML IV SOSY
PREFILLED_SYRINGE | INTRAVENOUS | Status: AC
  Filled 2020-01-12: qty 10

## 2020-01-12 MED ORDER — MIDAZOLAM HCL 2 MG/2ML IJ SOLN: 2.0000 mg | Freq: Once | INTRAMUSCULAR | Status: AC

## 2020-01-12 MED ORDER — FENTANYL CITRATE (PF) 100 MCG/2ML IJ SOLN: 50.0000 ug | Freq: Once | INTRAMUSCULAR | Status: AC

## 2020-01-12 MED ORDER — FENTANYL CITRATE (PF) 100 MCG/2ML IJ SOLN
25.0000 ug | INTRAMUSCULAR | Status: DC | PRN
  Administered 2020-01-12: 50 ug via INTRAVENOUS
  Administered 2020-01-12: 25 ug via INTRAVENOUS
  Administered 2020-01-12 (×2): 50 ug via INTRAVENOUS

## 2020-01-12 MED ORDER — METHOCARBAMOL 500 MG PO TABS
ORAL_TABLET | ORAL | Status: AC
  Filled 2020-01-12: qty 1

## 2020-01-12 MED ORDER — SODIUM CHLORIDE 0.9% FLUSH: 3.0000 mL | INTRAVENOUS | Status: DC | PRN

## 2020-01-12 MED ORDER — ACETAMINOPHEN 160 MG/5ML PO SOLN: 325.0000 mg | ORAL | Status: DC | PRN

## 2020-01-12 MED ORDER — METHOCARBAMOL 500 MG PO TABS: 500.0000 mg | ORAL_TABLET | Freq: Three times a day (TID) | ORAL | 0 refills | Status: AC | PRN

## 2020-01-12 MED ORDER — FAMOTIDINE 20 MG PO TABS
40.0000 mg | ORAL_TABLET | Freq: Two times a day (BID) | ORAL | Status: DC
  Administered 2020-01-12 – 2020-01-14 (×4): 40 mg via ORAL
  Filled 2020-01-12 (×4): qty 2

## 2020-01-12 MED ORDER — MEPERIDINE HCL 25 MG/ML IJ SOLN
6.2500 mg | INTRAMUSCULAR | Status: DC | PRN
  Administered 2020-01-12: 6.25 mg via INTRAVENOUS

## 2020-01-12 MED ORDER — EPINEPHRINE PF 1 MG/ML IJ SOLN
INTRAMUSCULAR | Status: AC
  Filled 2020-01-12: qty 1

## 2020-01-12 MED ORDER — BUPIVACAINE HCL (PF) 0.25 % IJ SOLN
INTRAMUSCULAR | Status: AC
  Filled 2020-01-12: qty 30

## 2020-01-12 MED ORDER — METHOCARBAMOL 1000 MG/10ML IJ SOLN
500.0000 mg | Freq: Four times a day (QID) | INTRAVENOUS | Status: DC | PRN
  Filled 2020-01-12: qty 5

## 2020-01-12 MED ORDER — EPHEDRINE SULFATE-NACL 50-0.9 MG/10ML-% IV SOSY
PREFILLED_SYRINGE | INTRAVENOUS | Status: DC | PRN
  Administered 2020-01-12 (×2): 5 mg via INTRAVENOUS

## 2020-01-12 MED ORDER — PROPOFOL 10 MG/ML IV BOLUS
INTRAVENOUS | Status: DC | PRN
  Administered 2020-01-12: 200 mg via INTRAVENOUS

## 2020-01-12 MED ORDER — ONDANSETRON HCL 4 MG/2ML IJ SOLN
INTRAMUSCULAR | Status: DC | PRN
  Administered 2020-01-12: 4 mg via INTRAVENOUS

## 2020-01-12 MED ORDER — EPHEDRINE 5 MG/ML INJ
INTRAVENOUS | Status: AC
  Filled 2020-01-12: qty 10

## 2020-01-12 MED ORDER — SUCCINYLCHOLINE CHLORIDE 200 MG/10ML IV SOSY
PREFILLED_SYRINGE | INTRAVENOUS | Status: DC | PRN
  Administered 2020-01-12: 140 mg via INTRAVENOUS

## 2020-01-12 MED ORDER — OXYCODONE HCL 5 MG PO TABS
10.0000 mg | ORAL_TABLET | ORAL | Status: DC | PRN
  Administered 2020-01-12 – 2020-01-13 (×7): 10 mg via ORAL
  Filled 2020-01-12 (×7): qty 2

## 2020-01-12 MED ORDER — OXYCODONE-ACETAMINOPHEN 10-325 MG PO TABS: 1.0000 | ORAL_TABLET | Freq: Four times a day (QID) | ORAL | 0 refills | Status: AC | PRN

## 2020-01-12 MED ORDER — ROPIVACAINE HCL 7.5 MG/ML IJ SOLN
INTRAMUSCULAR | Status: DC | PRN
  Administered 2020-01-12: 30 mL via PERINEURAL

## 2020-01-12 MED ORDER — LIDOCAINE 2% (20 MG/ML) 5 ML SYRINGE: INTRAMUSCULAR | Status: DC | PRN

## 2020-01-12 MED ORDER — METHOCARBAMOL 500 MG PO TABS
500.0000 mg | ORAL_TABLET | Freq: Four times a day (QID) | ORAL | Status: DC | PRN
  Administered 2020-01-12 – 2020-01-14 (×6): 500 mg via ORAL
  Filled 2020-01-12 (×6): qty 1

## 2020-01-12 MED ORDER — ONDANSETRON HCL 4 MG/2ML IJ SOLN: 4.0000 mg | Freq: Four times a day (QID) | INTRAMUSCULAR | Status: DC | PRN

## 2020-01-12 MED ORDER — FENTANYL CITRATE (PF) 100 MCG/2ML IJ SOLN
INTRAMUSCULAR | Status: AC
  Administered 2020-01-12: 50 ug via INTRAVENOUS
  Filled 2020-01-12: qty 2

## 2020-01-12 MED ORDER — BUPIVACAINE HCL (PF) 0.25 % IJ SOLN
INTRAMUSCULAR | Status: DC | PRN
  Administered 2020-01-12: 18 mL

## 2020-01-12 MED ORDER — DEXAMETHASONE SODIUM PHOSPHATE 10 MG/ML IJ SOLN
INTRAMUSCULAR | Status: AC
  Filled 2020-01-12: qty 1

## 2020-01-12 MED ORDER — LACTATED RINGERS IV SOLN: INTRAVENOUS | Status: DC | PRN

## 2020-01-12 MED ORDER — DEXAMETHASONE SODIUM PHOSPHATE 10 MG/ML IJ SOLN
INTRAMUSCULAR | Status: DC | PRN
  Administered 2020-01-12: 10 mg

## 2020-01-12 MED ORDER — GABAPENTIN 300 MG PO CAPS
300.0000 mg | ORAL_CAPSULE | Freq: Three times a day (TID) | ORAL | Status: DC
  Administered 2020-01-12 – 2020-01-14 (×5): 300 mg via ORAL
  Filled 2020-01-12 (×5): qty 1

## 2020-01-12 MED ORDER — ONDANSETRON HCL 4 MG/2ML IJ SOLN
4.0000 mg | Freq: Once | INTRAMUSCULAR | Status: AC | PRN
  Administered 2020-01-12: 4 mg via INTRAVENOUS

## 2020-01-12 MED ORDER — CEFAZOLIN SODIUM-DEXTROSE 1-4 GM/50ML-% IV SOLN
1.0000 g | Freq: Three times a day (TID) | INTRAVENOUS | Status: AC
  Administered 2020-01-12 – 2020-01-13 (×2): 1 g via INTRAVENOUS
  Filled 2020-01-12 (×2): qty 50

## 2020-01-12 SURGICAL SUPPLY — 67 items
BAND RUBBER #7 7IN GRN STRL LF (MISCELLANEOUS) ×3 IMPLANT
BLADE CLIPPER SURG (BLADE) IMPLANT
BLADE SURG 10 STRL SS (BLADE) ×3 IMPLANT
BONE MATRIX OSTEOCEL PRO LRG (Bone Implant) ×3 IMPLANT
CATH FOLEY 2WAY SLVR  5CC 16FR (CATHETERS) ×3
CATH FOLEY 2WAY SLVR 5CC 16FR (CATHETERS) ×1 IMPLANT
CLOSURE STERI-STRIP 1/2X4 (GAUZE/BANDAGES/DRESSINGS) ×1
CLSR STERI-STRIP ANTIMIC 1/2X4 (GAUZE/BANDAGES/DRESSINGS) ×2 IMPLANT
COVER SURGICAL LIGHT HANDLE (MISCELLANEOUS) ×3 IMPLANT
COVER TRANSDUCER ULTRASND 9X24 (MISCELLANEOUS) ×3 IMPLANT
COVER WAND RF STERILE (DRAPES) ×3 IMPLANT
DRAPE C-ARM 42X72 X-RAY (DRAPES) ×3 IMPLANT
DRAPE C-ARMOR (DRAPES) ×3 IMPLANT
DRAPE INCISE IOBAN 66X45 STRL (DRAPES) IMPLANT
DRSG OPSITE POSTOP 4X6 (GAUZE/BANDAGES/DRESSINGS) ×3 IMPLANT
DURAPREP 26ML APPLICATOR (WOUND CARE) ×3 IMPLANT
ELECT BLADE 4.0 EZ CLEAN MEGAD (MISCELLANEOUS) ×3
ELECT PENCIL ROCKER SW 15FT (MISCELLANEOUS) ×3 IMPLANT
ELECT REM PT RETURN 9FT ADLT (ELECTROSURGICAL) ×3
ELECTRODE BLDE 4.0 EZ CLN MEGD (MISCELLANEOUS) ×1 IMPLANT
ELECTRODE REM PT RTRN 9FT ADLT (ELECTROSURGICAL) ×1 IMPLANT
GLOVE BIO SURGEON STRL SZ 6.5 (GLOVE) ×2 IMPLANT
GLOVE BIO SURGEONS STRL SZ 6.5 (GLOVE) ×1
GLOVE BIOGEL PI IND STRL 6.5 (GLOVE) ×1 IMPLANT
GLOVE BIOGEL PI IND STRL 8.5 (GLOVE) ×1 IMPLANT
GLOVE BIOGEL PI INDICATOR 6.5 (GLOVE) ×2
GLOVE BIOGEL PI INDICATOR 8.5 (GLOVE) ×2
GLOVE SS BIOGEL STRL SZ 8.5 (GLOVE) ×1 IMPLANT
GLOVE SUPERSENSE BIOGEL SZ 8.5 (GLOVE) ×2
GOWN STRL REUS W/ TWL LRG LVL3 (GOWN DISPOSABLE) ×2 IMPLANT
GOWN STRL REUS W/ TWL XL LVL3 (GOWN DISPOSABLE) ×2 IMPLANT
GOWN STRL REUS W/TWL 2XL LVL3 (GOWN DISPOSABLE) ×6 IMPLANT
GOWN STRL REUS W/TWL LRG LVL3 (GOWN DISPOSABLE) ×6
GOWN STRL REUS W/TWL XL LVL3 (GOWN DISPOSABLE) ×6
GUIDEWIRE NITINOL BEVEL TIP (WIRE) ×12 IMPLANT
KIT BASIN OR (CUSTOM PROCEDURE TRAY) ×3 IMPLANT
KIT DILATOR XLIF 5 (KITS) ×2 IMPLANT
KIT SURGICAL ACCESS MAXCESS 4 (KITS) ×3 IMPLANT
KIT TURNOVER KIT B (KITS) ×3 IMPLANT
KIT XLIF (KITS) ×1
MODULE EMG NEEDLE SSEP NVM5 (NEEDLE) ×3 IMPLANT
MODULE NVM5 NEXT GEN EMG (NEEDLE) ×3 IMPLANT
MODULUS XLW 12X22X60MM 10 (Spine Construct) ×3 IMPLANT
NEEDLE 22X1 1/2 (OR ONLY) (NEEDLE) ×3 IMPLANT
NEEDLE I-PASS III (NEEDLE) ×3 IMPLANT
NEEDLE SPNL 18GX3.5 QUINCKE PK (NEEDLE) ×3 IMPLANT
NS IRRIG 1000ML POUR BTL (IV SOLUTION) ×3 IMPLANT
PACK LAMINECTOMY ORTHO (CUSTOM PROCEDURE TRAY) ×3 IMPLANT
PACK UNIVERSAL I (CUSTOM PROCEDURE TRAY) ×3 IMPLANT
PAD ARMBOARD 7.5X6 YLW CONV (MISCELLANEOUS) ×6 IMPLANT
ROD RELINE MAS LORD 5.5X50 (Rod) ×6 IMPLANT
SCREW LOCK RELINE 5.5 TULIP (Screw) ×12 IMPLANT
SCREW RELINE RED 6.5X50MM POLY (Screw) ×12 IMPLANT
SPONGE LAP 4X18 RFD (DISPOSABLE) ×3 IMPLANT
SPONGE SURGIFOAM ABS GEL 100 (HEMOSTASIS) ×3 IMPLANT
STAPLER VISISTAT 35W (STAPLE) ×3 IMPLANT
SURGIFLO W/THROMBIN 8M KIT (HEMOSTASIS) ×6 IMPLANT
SUT BONE WAX W31G (SUTURE) IMPLANT
SUT MNCRL AB 3-0 PS2 27 (SUTURE) ×6 IMPLANT
SUT VIC AB 1 CT1 18XCR BRD 8 (SUTURE) ×2 IMPLANT
SUT VIC AB 1 CT1 8-18 (SUTURE) ×6
SUT VIC AB 2-0 CT1 18 (SUTURE) ×6 IMPLANT
SYR BULB IRRIGATION 50ML (SYRINGE) ×3 IMPLANT
TAPE CLOTH 4X10 WHT NS (GAUZE/BANDAGES/DRESSINGS) ×3 IMPLANT
TOWEL GREEN STERILE (TOWEL DISPOSABLE) ×3 IMPLANT
TOWEL GREEN STERILE FF (TOWEL DISPOSABLE) ×3 IMPLANT
WATER STERILE IRR 1000ML POUR (IV SOLUTION) ×3 IMPLANT

## 2020-01-12 NOTE — OR Nursing (Signed)
Dr. Tery Sanfilippo called stating no loose foreign body noted. Room made aware.

## 2020-01-12 NOTE — Anesthesia Postprocedure Evaluation (Signed)
Anesthesia Post Note  Patient: Kyle Davidson  Procedure(s) Performed: ANTERIOR LATERAL LUMBAR FUSION (XLIF) L3-4 WITH POSTERIOR SPINAL FUSION INTERBODY (N/A )     Patient location during evaluation: PACU Anesthesia Type: Regional and General Level of consciousness: awake Pain management: pain level controlled Vital Signs Assessment: post-procedure vital signs reviewed and stable Respiratory status: spontaneous breathing, nonlabored ventilation, respiratory function stable and patient connected to nasal cannula oxygen Cardiovascular status: blood pressure returned to baseline and stable Postop Assessment: no apparent nausea or vomiting Anesthetic complications: no    Last Vitals:  Vitals:   01/12/20 1712 01/12/20 1746  BP: 135/72 138/80  Pulse: 65 (!) 101  Resp: (!) 43 18  Temp:  36.8 C  SpO2: 96% 98%    Last Pain:  Vitals:   01/12/20 1937  TempSrc:   PainSc: 6                  [name] P Simi Briel

## 2020-01-12 NOTE — Discharge Instructions (Signed)

## 2020-01-12 NOTE — Anesthesia Procedure Notes (Signed)
Procedure Name: Intubation Date/Time: 01/12/2020 11:10 AM Performed by: Orlie Dakin, CRNA Pre-anesthesia Checklist: Patient identified, Emergency Drugs available, Suction available and Patient being monitored Patient Re-evaluated:Patient Re-evaluated prior to induction Oxygen Delivery Method: Circle system utilized Preoxygenation: Pre-oxygenation with 100% oxygen Induction Type: IV induction Laryngoscope Size: Miller and 3 Grade View: Grade I Tube type: Oral Tube size: 7.5 mm Number of attempts: 1 Airway Equipment and Method: Stylet and LTA kit utilized Placement Confirmation: ETT inserted through vocal cords under direct vision,  positive ETCO2 and breath sounds checked- equal and bilateral Secured at: 21 cm Tube secured with: Tape Dental Injury: Teeth and Oropharynx as per pre-operative assessment  Comments: DL and large floppy epiglottis noted.  4x4s bite block used.
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
## 2020-01-12 NOTE — Transfer of Care (Signed)
Immediate Anesthesia Transfer of Care Note  Patient: Kyle Davidson  Procedure(s) Performed: ANTERIOR LATERAL LUMBAR FUSION (XLIF) L3-4 WITH POSTERIOR SPINAL FUSION INTERBODY (N/A )  Patient Location: PACU  Anesthesia Type:General and Regional  Level of Consciousness: drowsy  Airway & Oxygen Therapy: Patient Spontanous Breathing and Patient connected to face mask oxygen  Post-op Assessment: Report given to RN and Post -op Vital signs reviewed and stable  Post vital signs: Reviewed  Last Vitals:  Vitals Value Taken Time  BP 162/91 01/12/20 1541  Temp    Pulse 72 01/12/20 1551  Resp 19 01/12/20 1551  SpO2 100 % 01/12/20 1551  Vitals shown include unvalidated device data.  Last Pain:  Vitals:   01/12/20 1030  TempSrc:   PainSc: 0-No pain      Patients Stated Pain Goal: 2 ([id number])  Complications: No apparent anesthesia complications. Pt moving extremities x4, Dr. Rolena Infante assessed pt in PACU.

## 2020-01-12 NOTE — Brief Op Note (Signed)
01/12/2020  2:49 PM  PATIENT:  Kyle Davidson  69 y.o. male  PRE-OPERATIVE DIAGNOSIS:  Degeneratvie disc disease L3-4 with discogenic lumbar back pain  POST-OPERATIVE DIAGNOSIS:  Degeneratvie disc disease L3-4 with discogenic lumbar back pain  PROCEDURE:  Procedure(s) with comments: ANTERIOR LATERAL LUMBAR FUSION (XLIF) L3-4 WITH POSTERIOR SPINAL FUSION INTERBODY (N/A) - 3.5 hrs Left sided tap block with exparel  SURGEON:  Surgeon(s) and Role:    Melina Schools, MD - Primary  PHYSICIAN ASSISTANT:   ASSISTANTS: Amanda Ward, PA  ANESTHESIA:   general  EBL:  150 mL   BLOOD ADMINISTERED:none  DRAINS: none   LOCAL MEDICATIONS USED:  MARCAINE     SPECIMEN:  No Specimen  DISPOSITION OF SPECIMEN:  N/A  COUNTS:  YES  TOURNIQUET:  * No tourniquets in log *  DICTATION: .Dragon Dictation  PLAN OF CARE: Admit to inpatient   PATIENT DISPOSITION:  PACU - hemodynamically stable.

## 2020-01-12 NOTE — Anesthesia Procedure Notes (Signed)
Anesthesia Regional Block: TAP block   Pre-Anesthetic Checklist: ,, timeout performed, Correct Patient, Correct Site, Correct Laterality, Correct Procedure, Correct Position, site marked, Risks and benefits discussed,  Surgical consent,  Pre-op evaluation,  At surgeon's request and post-op pain management  Laterality: Left  Prep: chloraprep       Needles:  Injection technique: Single-shot  Needle Type: Echogenic Stimulator Needle     Needle Length: 5cm  Needle Gauge: 22     Additional Needles:   Procedures:, nerve stimulator,,, ultrasound used (permanent image in chart),,,,  Narrative:  Start time: 01/12/2020 10:18 AM End time: 01/12/2020 10:25 AM Injection made incrementally with aspirations every 5 mL.  Performed by: Personally  Anesthesiologist: Janeece Riggers, MD  Additional Notes: Functioning IV was confirmed and monitors were applied.  A 69mm 22ga Arrow echogenic stimulator needle was used. Sterile prep and drape,hand hygiene and sterile gloves were used. Ultrasound guidance: relevant anatomy identified, needle position confirmed, local anesthetic spread visualized around nerve(s)., vascular puncture avoided.  Image printed for medical record. Negative aspiration and negative test dose prior to incremental administration of local anesthetic. The patient tolerated the procedure well.

## 2020-01-12 NOTE — H&P (Signed)
Addendum H&P  There is been no change in the patient's clinical exam since his last office visit of 01/05/2020.  He continues to have severe back buttock and radicular left lower extremity pain I have gone over the surgical procedure with him in great detail and all of his questions were encouraged and addressed plan on moving forward with a lateral interbody fusion with posterior pedicle screw instrumentation.

## 2020-01-13 DIAGNOSIS — M5136 Other intervertebral disc degeneration, lumbar region: Secondary | ICD-10-CM | POA: Diagnosis not present

## 2020-01-13 MED FILL — Thrombin For Soln Kit 20000 Unit: CUTANEOUS | Qty: 1 | Status: AC

## 2020-01-13 NOTE — Progress Notes (Signed)
    Subjective: Procedure(s) (LRB): ANTERIOR LATERAL LUMBAR FUSION (XLIF) L3-4 WITH POSTERIOR SPINAL FUSION INTERBODY (N/A) 1 Day Post-Op  Patient reports pain as 3 on 0-10 scale.  Reports decreased leg pain reports incisional back pain   Positive void Negative bowel movement Negative flatus Negative chest pain or shortness of breath  Objective: Vital signs in last 24 hours: Temp:  [97.6 F (36.4 C)-98.2 F (36.8 C)] 97.6 F (36.4 C) (04/23 0717) Pulse Rate:  [62-101] 70 (04/23 0717) Resp:  [10-43] 17 (04/23 0717) BP: (135-189)/(67-104) 135/67 (04/23 0717) SpO2:  [92 %-100 %] 98 % (04/23 0717) Weight:  [93.9 kg] 93.9 kg (04/22 0838)  Intake/Output from previous day: 04/22 0701 - 04/23 0700 In: 1850 [I.V.:1600; IV Piggyback:250] Out: [id number] [Urine:1350; Blood:200]  Labs: No results for input(s): WBC, RBC, HCT, PLT in the last 72 hours. No results for input(s): NA, K, CL, CO2, BUN, CREATININE, GLUCOSE, CALCIUM in the last 72 hours. No results for input(s): LABPT, INR in the last 72 hours.  Physical Exam: Neurologically intact ABD soft Intact pulses distally Incision: dressing C/D/I and no drainage Compartment soft Body mass index is 25.87 kg/m.   Assessment/Plan: Patient stable  xrays n/a Continue mobilization with physical therapy Continue care  Advance diet Up with therapy  Doing well overall - Plan on d/c to home today or in AM. F/U in 2 weeks for wound check.  Melina Schools, MD [facility] [phone]

## 2020-01-13 NOTE — Progress Notes (Signed)
Nurse gave 6.25 mg of demerol Nurse wasted 18.75 mg witness by Renita Papa

## 2020-01-13 NOTE — Progress Notes (Signed)
PT Progress Note for Charges    01/13/20 1100  PT Visit Information  Last PT Received On 01/13/20  PT General Charges  $$ ACUTE PT VISIT 1 Visit  PT Evaluation  $PT Eval Low Complexity 1 Low  PT Treatments  $Gait Training 8-22 mins  Anastasio Champion, DPT  Acute Rehabilitation Services Pager [phone] Office [phone]

## 2020-01-13 NOTE — Evaluation (Signed)
Physical Therapy Evaluation Patient Details Name: Kyle Davidson MRN: [id number] DOB: 1951-09-06 Today's Date: 01/13/2020   History of Present Illness  Sam Nebel is a 69 year old male with PMH significant for HTN, hyperlipidemia, GERD, cervical fusion (2020), and L shoulder pain and weakness who presents with back and L LE pain. S/p lateral interbody fusion L3-4., posterior spinal fusion with instrumentation (pedicle screw fixation) L3-4 (01/12/20)    Clinical Impression  Patient presented sitting in chair, awake, and willing to participate in therapy. PTA, pt was independent with ambulation and in all ADL's. Pt lives with his wife in a one level home with 2 steps to enter. At the time of evaluation, pt was able to stand from the chair with supervision and inc time. He ambulated ~39ft with RW and min guard initially demo step-to pattern due to L LE pain but progressing to step-through gait pattern. He was able to navigate 2 steps with 1 rail and min guard, demonstrating ascending with R lead leg, and descending with L lead leg due to L LE pain. He was able to perform bed mobility with supervision/ mod I while adhering to back precautions- no physical assist required. Educated pt on back precautions related to functional mobility and importance of walking program- pt verbalized and demonstrated understanding. Recommend d/c home with wife support and f/u with OP PT to address impairments and allow pt to return to independence once medically clear.  Pt would continue to benefit from skilled physical therapy services at this time while admitted and after d/c to address the below listed limitations in order to improve overall safety and independence with functional mobility.     Follow Up Recommendations Supervision/Assistance - 24 hour;Outpatient PT(OP PT once medically cleared)    Equipment Recommendations  None recommended by PT(pt has RW and 3N1)    Recommendations for Other Services        Precautions / Restrictions Precautions Precautions: Cervical;Fall Precaution Booklet Issued: No(OT provided- PT reviewed) Required Braces or Orthoses: Spinal Brace Spinal Brace: Lumbar corset;Applied in sitting position Restrictions Weight Bearing Restrictions: No      Mobility  Bed Mobility Overal bed mobility: Modified Independent Bed Mobility: Rolling;Sit to Supine Rolling: Modified independent (Device/Increase time) Sidelying to sit: Supervision   Sit to supine: Modified independent (Device/Increase time)   General bed mobility comments: no assist needed, pt demonstrates proper logroll technique  Transfers Overall transfer level: Needs assistance Equipment used: None Transfers: Sit to/from Stand Sit to Stand: Supervision         General transfer comment: Pt demonstrates proper sequencing scooting to edge of chair, uses Bil UE on thighs to push into standing, no LOB noted, inc time required. States this is how he normally does it  Ambulation/Gait Ambulation/Gait assistance: Counsellor (Feet): 75 Feet Assistive device: Rolling walker (2 wheeled) Gait Pattern/deviations: Step-through pattern;Decreased stride length;Decreased step length - right Gait velocity: moderately dec   General Gait Details: Pt with dec L step length due to pain, initially step-to with R lead leg, progressing to step through  Stairs Stairs: Yes Stairs assistance: Min guard Stair Management: One rail Right;One rail Left;Step to pattern;Forwards Number of Stairs: 2 General stair comments: Pt demo step to ascending with R lead leg ('good' leg), and descending with L lead leg due to L LE pain. Min guard for safety  Wheelchair Mobility    Modified Rankin (Stroke Patients Only)       Balance Overall balance assessment: Needs assistance Sitting-balance support: No upper  extremity supported;Feet supported Sitting balance-Leahy Scale: Good     Standing balance support:  Bilateral upper extremity supported;During functional activity;No upper extremity supported Standing balance-Leahy Scale: Fair Standing balance comment: Pt able to static stand without difficulties, requires Bil UE support for dynamic challenges                             Pertinent Vitals/Pain Pain Assessment: Faces Faces Pain Scale: Hurts even more Pain Location: Back and L LE Pain Descriptors / Indicators: Discomfort;Grimacing;Operative site guarding;Numbness Pain Intervention(s): Limited activity within patient's tolerance;Monitored during session;Repositioned    Home Living Family/patient expects to be discharged to:: Private residence Living Arrangements: Spouse/significant other Available Help at Discharge: Family;Available 24 hours/day Type of Home: House Home Access: Stairs to enter Entrance Stairs-Rails: None Entrance Stairs-Number of Steps: 2 Home Layout: One level 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: Shower seat - built in;Grab bars - tub/shower;Hand held Tourist information centre manager - 2 wheels Additional Comments: Lives with wife; multiple young grandkids who visit often    Prior Function Level of Independence: Independent         Comments: Independent with mobility, although limited by BLE weakness and LUE pain/weakness     Hand Dominance   Dominant Hand: Right    Extremity/Trunk Assessment   Upper Extremity Assessment Upper Extremity Assessment: LUE deficits/detail;Defer to OT evaluation LUE Deficits / Details: Limited horizontal adduction due to pain, grip strength 3/5 LUE Coordination: decreased gross motor    Lower Extremity Assessment Lower Extremity Assessment: RLE deficits/detail;LLE deficits/detail;Generalized weakness RLE Deficits / Details: Limited hip flex, knee ext, and DF d/t pain, strength 3+/5 grossly RLE Sensation: WNL RLE Coordination: WNL LLE Deficits / Details: Limited hip flex, knee ext, and DF d/t pain, strength 3+/5 grossly (L more involved vs  R) LLE Sensation: decreased light touch(anterior superior thigh, norm elsewhere) LLE Coordination: WNL    Cervical / Trunk Assessment Cervical / Trunk Assessment: Other exceptions Cervical / Trunk Exceptions: s/p lumbar sx  Communication   Communication: No difficulties  Cognition Arousal/Alertness: Awake/alert Behavior During Therapy: WFL for tasks assessed/performed Overall Cognitive Status: History of cognitive impairments - at baseline                                 General Comments: Pt WFL for tasks assessed, no apparent deficits observed- able to recall and demonsrtate precautions w/o assist      General Comments      Exercises     Assessment/Plan    PT Assessment Patient needs continued PT services  PT Problem List Decreased strength;Decreased range of motion;Decreased activity tolerance;Decreased balance;Decreased mobility;Decreased coordination;Decreased cognition       PT Treatment Interventions DME instruction;Gait training;Stair training;Functional mobility training;Therapeutic activities;Therapeutic exercise;Balance training;Neuromuscular re-education;Cognitive remediation;Patient/family education    PT Goals (Current goals can be found in the Care Plan section)  Acute Rehab PT Goals Patient Stated Goal: to be able to play with grandkids PT Goal Formulation: With patient Time For Goal Achievement: 01/27/20 Potential to Achieve Goals: Good    Frequency Min 5X/week   Barriers to discharge        Co-evaluation               AM-PAC PT "6 Clicks" Mobility  Outcome Measure Help needed turning from your back to your side while in a flat bed without using bedrails?: A Little Help needed moving from lying on your back to  sitting on the side of a flat bed without using bedrails?: None Help needed moving to and from a bed to a chair (including a wheelchair)?: A Little Help needed standing up from a chair using your arms (e.g., wheelchair or  bedside chair)?: None Help needed to walk in hospital room?: A Little Help needed climbing 3-5 steps with a railing? : A Little 6 Click Score: 20    End of Session Equipment Utilized During Treatment: Gait belt;Back brace Activity Tolerance: Patient tolerated treatment well Patient left: in bed;with call bell/phone within reach;with bed alarm set Nurse Communication: Mobility status PT Visit Diagnosis: Unsteadiness on feet (R26.81);Other abnormalities of gait and mobility (R26.89);Muscle weakness (generalized) (M62.81)    Time: [id number] PT Time Calculation (min) (ACUTE ONLY): 27 min   Charges:   PT Evaluation $PT Eval Low Complexity: (P) 1 Low PT Treatments $Gait Training: (P) 8-22 mins        Alaena Strader, SPT Acute Rehab  [id number]   Fredrick Geoghegan 01/13/2020, 11:00 AM
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
## 2020-01-14 DIAGNOSIS — M5136 Other intervertebral disc degeneration, lumbar region: Secondary | ICD-10-CM | POA: Diagnosis not present

## 2020-01-14 NOTE — Plan of Care (Signed)
Patient alert and oriented, mae's well, voiding adequate amount of urine, swallowing without difficulty, no c/o pain at time of discharge. Patient discharged home with family. Script and discharged instructions given to patient. Patient and family stated understanding of instructions given. Patient has an appointment with Dr. Brooks  

## 2020-01-14 NOTE — Progress Notes (Signed)
Subjective: 2 Days Post-Op Procedure(s) (LRB): ANTERIOR LATERAL LUMBAR FUSION (XLIF) L3-4 WITH POSTERIOR SPINAL FUSION INTERBODY (N/A) Patient reports pain as 3 on 0-10 scale.   + void, flatus, - BM Denies CP, SOB Was able to get out of bed yesterday and this am Numbness and pain in left lower extremity, resolving in right   Objective: Vital signs in last 24 hours: Temp:  [98.1 F (36.7 C)-98.4 F (36.9 C)] 98.3 F (36.8 C) (04/24 0710) Pulse Rate:  [66-76] 66 (04/24 0710) Resp:  [16-17] 17 (04/24 0710) BP: (142-150)/(62-76) 147/76 (04/24 0710) SpO2:  [95 %-98 %] 97 % (04/24 0710)  Intake/Output from previous day: 04/23 0701 - 04/24 0700 In: 240 [P.O.:240] Out: -  Intake/Output this shift: No intake/output data recorded.  No results for input(s): HGB in the last 72 hours. No results for input(s): WBC, RBC, HCT, PLT in the last 72 hours. No results for input(s): NA, K, CL, CO2, BUN, CREATININE, GLUCOSE, CALCIUM in the last 72 hours. No results for input(s): LABPT, INR in the last 72 hours.  Neurologically intact Neurovascular intact Intact pulses distally Dorsiflexion/Plantar flexion intact Incision: dressing C/D/I Compartment soft   Assessment/Plan: 2 Days Post-Op Procedure(s) (LRB): ANTERIOR LATERAL LUMBAR FUSION (XLIF) L3-4 WITH POSTERIOR SPINAL FUSION INTERBODY (N/A) Advance diet Up with therapy D/C IV fluids Discharge home with home health    Patient's anticipated LOS is less than 2 midnights, meeting these requirements: - Younger than 83 - Lives within 1 hour of care - Has a competent adult at home to recover with post-op recover - NO history of  - Chronic pain requiring opiods  - Diabetes  - Coronary Artery Disease  - Heart failure  - Heart attack  - Stroke  - DVT/VTE  - Cardiac arrhythmia  - Respiratory Failure/COPD  - Renal failure  - Anemia  - Advanced Liver disease       Nickolette Espinola R Kohl Polinsky 01/14/2020, 7:18 AM

## 2020-01-14 NOTE — Progress Notes (Signed)
Physical Therapy Treatment Patient Details Name: Kyle Davidson MRN: [id number] DOB: [birth year] Today's Date: 01/14/2020    History of Present Illness Kyle Davidson is a 69 year old male with PMH significant for HTN, hyperlipidemia, GERD, cervical fusion (2020), and L shoulder pain and weakness who presents with back and L LE pain. S/p lateral interbody fusion L3-4., posterior spinal fusion with instrumentation (pedicle screw fixation) L3-4 (01/12/20)    PT Comments    Pt tolerated treatment well, requiring encouragement to participate in session initially. Pt requires supervision during rolling to maintain back precautions and is able to verbalize back precautions with cues. Pt independently manages brace. Pt demonstrating no significant balance deviations during session and ambulates independently. Pt negotiating stairs without physical assistance. Pt will benefit form outpatient PT for further strengthening, gait and balance training once cleared by MD.   Follow Up Recommendations  Supervision/Assistance - 24 hour;Outpatient PT(outpatient PT when cleared by MD)     Equipment Recommendations  (pt has necessary DME)    Recommendations for Other Services       Precautions / Restrictions Precautions Precautions: Cervical;Fall Precaution Booklet Issued: No Precaution Comments: verbally review precautions and brace use Required Braces or Orthoses: Spinal Brace Spinal Brace: Lumbar corset Restrictions Weight Bearing Restrictions: No    Mobility  Bed Mobility Overal bed mobility: Needs Assistance Bed Mobility: Rolling;Sidelying to Sit Rolling: Supervision(cues to maintain log roll) Sidelying to sit: Modified independent (Device/Increase time)       General bed mobility comments: Pt reclined in bed. Pt denying need to show OTR bed mobility.  Transfers Overall transfer level: Needs assistance Equipment used: Rolling walker (2 wheeled) Transfers: Sit to/from Stand Sit to Stand:  Modified independent (Device/Increase time)            Ambulation/Gait Ambulation/Gait assistance: Modified independent (Device/Increase time) Gait Distance (Feet): 150 Feet Assistive device: Rolling walker (2 wheeled) Gait Pattern/deviations: Step-through pattern Gait velocity: functional Gait velocity interpretation: 1.31 - 2.62 ft/sec, indicative of limited community ambulator General Gait Details: pt with step through gait, reduced stride length   Stairs Stairs: Yes Stairs assistance: Supervision Stair Management: One rail Right;Step to pattern Number of Stairs: 2     Wheelchair Mobility    Modified Rankin (Stroke Patients Only)       Balance Overall balance assessment: Modified Independent Sitting-balance support: No upper extremity supported;Feet supported Sitting balance-Leahy Scale: Normal     Standing balance support: No upper extremity supported;During functional activity Standing balance-Leahy Scale: Good Standing balance comment: supervision for toileting and hand washing                            Cognition Arousal/Alertness: Awake/alert Behavior During Therapy: WFL for tasks assessed/performed Overall Cognitive Status: History of cognitive impairments - at baseline                                 General Comments: No apparent deficits noted; able to recall proper positioning and back precautions      Exercises      General Comments General comments (skin integrity, edema, etc.): VSS on RA, pt dons brace independently      Pertinent Vitals/Pain Pain Assessment: Faces Faces Pain Scale: Hurts little more Pain Location: Back and L LE Pain Descriptors / Indicators: Discomfort;Grimacing;Operative site guarding;Numbness Pain Intervention(s): Limited activity within patient's tolerance    Home Living  Prior Function            PT Goals (current goals can now be found in the care plan  section) Acute Rehab PT Goals Patient Stated Goal: to be able to play with grandkids Progress towards PT goals: Progressing toward goals    Frequency    Min 5X/week      PT Plan Current plan remains appropriate    Co-evaluation              AM-PAC PT "6 Clicks" Mobility   Outcome Measure  Help needed turning from your back to your side while in a flat bed without using bedrails?: None Help needed moving from lying on your back to sitting on the side of a flat bed without using bedrails?: None Help needed moving to and from a bed to a chair (including a wheelchair)?: None Help needed standing up from a chair using your arms (e.g., wheelchair or bedside chair)?: None Help needed to walk in hospital room?: None Help needed climbing 3-5 steps with a railing? : None 6 Click Score: 24    End of Session Equipment Utilized During Treatment: Back brace Activity Tolerance: Patient tolerated treatment well Patient left: in chair;with call bell/phone within reach Nurse Communication: Mobility status PT Visit Diagnosis: Unsteadiness on feet (R26.81);Other abnormalities of gait and mobility (R26.89);Muscle weakness (generalized) (M62.81)     Time: [id number] PT Time Calculation (min) (ACUTE ONLY): 13 min  Charges:  $Gait Training: 8-22 mins                     Zenaida Niece, PT, DPT Acute Rehabilitation Pager: [phone]    Zenaida Niece 01/14/2020, 10:21 AM

## 2020-01-14 NOTE — Discharge Summary (Signed)
Physician Discharge Summary  Patient ID: Kyle Davidson MRN: [id number] DOB/AGE: [birth year] 69 y.o.  Admit date: 01/12/2020 Discharge date: 01/14/2020  Admission Diagnoses: Degenerative lumbar disc disease L3-4  With significant back and radicular leg pain  Discharge Diagnoses:  Active Problems:   S/P lumbar fusion   Discharged Condition: stable  Hospital Course: Patient was admitted on 4/22 for a Lumbar fusion L3-4 and posterior spinal fusion with screw fixation. Patient was having low back and radiating leg pain. Patient did well during surgery. Was sent to PACU in stable condition. Sent to the recovery floor in stable condition.  Postop day 1: Patient had no events over night. Pain is well controlled with oral pain medications. He was able to void, no flatus or BM. Patient was able to get up with PT and his nurse multiple times during the day as well as at night time. Post op day 2: patient doing well, no events over night. Pain is about a 3/10. Was able to get up in am. Still no BM but has + flatus. Still complaining of some left leg pain and numbness right side resolving. Patient was discharged today. Medications sent to pharmacy.  Will follow up with Dr. Rolena Infante in 2 weeks for wound check.   Consults: Therapy  Significant Diagnostic Studies: none  Treatments: IV hydration, antibiotics: Ancef, analgesia: acetaminophen and oxycodone, therapies: PT and OT and surgery: Lumbar fusion at L3-4  Discharge Exam: Blood pressure (!) 147/76, pulse 66, temperature 98.3 F (36.8 C), temperature source Oral, resp. rate 17, height 6\' 3"  (1.905 m), weight 93.9 kg, SpO2 97 %. General appearance: alert, cooperative, appears stated age and no distress Extremities: extremities normal, atraumatic, no cyanosis or edema and Homans sign is negative, no sign of DVT Pulses: 2+ and symmetric Skin: Skin color, texture, turgor normal. No rashes or lesions Neurologic: Grossly normal Incision/Wound:  dressings clean dry and intact  Disposition: Discharge disposition: 01-Home or Self Care       Discharge Instructions    Call MD / Call 911   Complete by: As directed    If you experience chest pain or shortness of breath, CALL 911 and be transported to the hospital emergency room.  If you develope a fever above 101 F, pus (white drainage) or increased drainage or redness at the wound, or calf pain, call your surgeon's office.   Constipation Prevention   Complete by: As directed    Drink plenty of fluids.  Prune juice may be helpful.  You may use a stool softener, such as Colace (over the counter) 100 mg twice a day.  Use MiraLax (over the counter) for constipation as needed.   Diet - low sodium heart healthy   Complete by: As directed    Increase activity slowly as tolerated   Complete by: As directed      Allergies as of 01/14/2020      Reactions   Codeine Other (See Comments)   Bad headaches.....makes him see things      Medication List    STOP taking these medications   acetaminophen 500 MG tablet Commonly known as: TYLENOL   aspirin EC 81 MG tablet   Fish Oil 1000 MG Caps   ibuprofen 800 MG tablet Commonly known as: ADVIL   oxyCODONE 5 MG immediate release tablet Commonly known as: Oxy IR/ROXICODONE     TAKE these medications   methocarbamol 500 MG tablet Commonly known as: Robaxin Take 1 tablet (500 mg total) by mouth every 8 (  eight) hours as needed for up to 5 days for muscle spasms.   ondansetron 4 MG tablet Commonly known as: Zofran Take 1 tablet (4 mg total) by mouth every 8 (eight) hours as needed for nausea or vomiting.   oxyCODONE-acetaminophen 10-325 MG tablet Commonly known as: Percocet Take 1 tablet by mouth every 6 (six) hours as needed for up to 5 days for pain.   pantoprazole 40 MG tablet Commonly known as: PROTONIX Take 40 mg by mouth 2 (two) times daily.      Follow-up Information    Melina Schools, MD. Schedule an appointment as  soon as possible for a visit in 2 weeks.   Specialty: Orthopedic Surgery Why: If symptoms worsen, For suture removal, For wound re-check Contact information: [address] [place] [address] [id number]           Signed: Drue Novel, PA-C [facility] 01/14/2020, 7:11 AM

## 2020-01-14 NOTE — Progress Notes (Signed)
Occupational Therapy Treatment Patient Details Name: Kyle Davidson MRN: [id number] DOB: [birth year] Today's Date: 01/14/2020    History of present illness Kyle Davidson is a 69 year old male with PMH significant for HTN, hyperlipidemia, GERD, cervical fusion (2020), and L shoulder pain and weakness who presents with back and L LE pain. S/p lateral interbody fusion L3-4., posterior spinal fusion with instrumentation (pedicle screw fixation) L3-4 (01/12/20)   OT comments  Pt progressing towards goals and verbalizing use of ADL AE for lower body dressing/bathing. Pt already dressed. Pt plans to have supportive spouse assist as needed. Pt verbalizing 3/3 back precautions with no assist required. Pt describing proper UB ADL strategies required to adhere to back precautions. Pt would benefit from continued OT skilled services. OT following acutely.     Follow Up Recommendations  No OT follow up;Other (comment)    Equipment Recommendations  Other (comment)(reacher and long handled sponge)    Recommendations for Other Services      Precautions / Restrictions Precautions Precautions: Cervical;Fall Required Braces or Orthoses: Spinal Brace Spinal Brace: Lumbar corset Restrictions Weight Bearing Restrictions: No       Mobility Bed Mobility               General bed mobility comments: Pt reclined in bed. Pt denying need to show OTR bed mobility.  Transfers                      Balance                                           ADL either performed or assessed with clinical judgement   ADL Overall ADL's : Needs assistance/impaired                                       General ADL Comments: Pt stating "I do not want to get up at this time. I will be fine." Pt able to recall precautions and how to perform LB ADL by adhering to precautions. Pt recalling use of reacher for LB dressing and he plans to not wear socks. Pt wearing clothes  already dressed. Pt reports that his spouse is very supportive and will assist as needed. All ADL reviewed. Pt describing need for 2 cups for grooming to avoid bending at sink and squatting in order to reach items lower than shoulder level.     Vision   Vision Assessment?: No apparent visual deficits   Perception     Praxis      Cognition Arousal/Alertness: Awake/alert Behavior During Therapy: WFL for tasks assessed/performed Overall Cognitive Status: History of cognitive impairments - at baseline                                 General Comments: No apparent deficits noted; able to recall proper positioning and back precautions        Exercises     Shoulder Instructions       General Comments      Pertinent Vitals/ Pain       Pain Assessment: Faces Faces Pain Scale: Hurts little more Pain Location: Back and L LE Pain Descriptors / Indicators: Discomfort;Grimacing;Operative site guarding;Numbness Pain Intervention(s): Limited activity within patient's tolerance  Home Living                                          Prior Functioning/Environment              Frequency           Progress Toward Goals  OT Goals(current goals can now be found in the care plan section)  Progress towards OT goals: Progressing toward goals  Acute Rehab OT Goals Patient Stated Goal: to be able to play with grandkids OT Goal Formulation: With patient Time For Goal Achievement: 01/27/20 Potential to Achieve Goals: Good ADL Goals Pt Will Perform Lower Body Dressing: with set-up;with supervision;sit to/from stand;with adaptive equipment Pt Will Transfer to Toilet: with set-up;with supervision;ambulating;regular height toilet;grab bars Pt Will Perform Tub/Shower Transfer: Shower transfer;with supervision;ambulating;shower seat;rolling walker Additional ADL Goal #1: Pt will independently verbalize 3/3 back precautions  Plan All goals met and education  completed, patient discharged from OT services    Co-evaluation                 AM-PAC OT "6 Clicks" Daily Activity     Outcome Measure   Help from another person eating meals?: None Help from another person taking care of personal grooming?: A Little Help from another person toileting, which includes using toliet, bedpan, or urinal?: A Little Help from another person bathing (including washing, rinsing, drying)?: A Little Help from another person to put on and taking off regular upper body clothing?: None Help from another person to put on and taking off regular lower body clothing?: A Little 6 Click Score: 20    End of Session Equipment Utilized During Treatment: Back brace  OT Visit Diagnosis: Unsteadiness on feet (R26.81);Pain Pain - part of body: (back)   Activity Tolerance Patient limited by pain   Patient Left in bed;with call bell/phone within reach   Nurse Communication Mobility status        Time: 7408-1448 OT Time Calculation (min): 14 min  Charges: OT General Charges $OT Visit: 1 Visit OT Treatments $Therapeutic Activity: 8-22 mins  Jefferey Pica, OTR/L Acute Rehabilitation Services Pager: [phone] Office: [phone]    Jefferey Pica 01/14/2020, 9:23 AM

## 2020-01-18 ENCOUNTER — Other Ambulatory Visit: Payer: Self-pay | Admitting: Orthopedic Surgery

## 2020-01-18 ENCOUNTER — Other Ambulatory Visit: Payer: Self-pay

## 2020-01-18 ENCOUNTER — Ambulatory Visit
Admission: RE | Admit: 2020-01-18 | Discharge: 2020-01-18 | Disposition: A | Discharge status: Home / Self Care | Payer: Worker's Compensation | Source: Ambulatory Visit | Attending: Orthopedic Surgery | Admitting: Orthopedic Surgery

## 2020-01-18 DIAGNOSIS — R058 Other specified cough: Secondary | ICD-10-CM

## 2020-01-18 DIAGNOSIS — R05 Cough: Secondary | ICD-10-CM

## 2020-01-31 ENCOUNTER — Telehealth: Payer: Self-pay | Admitting: *Deleted

## 2020-01-31 NOTE — Telephone Encounter (Signed)
I spoke with pt and informed we could treat the problem with the skin on his feet and if not we could refer.

## 2020-01-31 NOTE — Telephone Encounter (Signed)
Pt called states both of his feet are bright red and wanted to know if we could treat him.

## 2020-02-16 ENCOUNTER — Other Ambulatory Visit: Payer: Self-pay

## 2020-02-16 ENCOUNTER — Encounter: Payer: Self-pay | Admitting: Podiatry

## 2020-02-16 ENCOUNTER — Ambulatory Visit: Payer: Medicare HMO | Admitting: Podiatry

## 2020-02-16 VITALS — Temp 98.1°F

## 2020-02-16 DIAGNOSIS — L600 Ingrowing nail: Secondary | ICD-10-CM | POA: Diagnosis not present

## 2020-02-16 DIAGNOSIS — N200 Calculus of kidney: Secondary | ICD-10-CM | POA: Insufficient documentation

## 2020-02-16 DIAGNOSIS — L309 Dermatitis, unspecified: Secondary | ICD-10-CM

## 2020-02-16 MED ORDER — TERBINAFINE HCL 250 MG PO TABS: 250.0000 mg | ORAL_TABLET | Freq: Every day | ORAL | 0 refills | Status: DC

## 2020-02-16 NOTE — Progress Notes (Signed)
Subjective:   Patient ID: Kyle Davidson, male   DOB: 69 y.o.   MRN: [id number]   HPI Patient presents stating he is got some irritation on top of both feet with history of fungal infection is tried sprays which are not helping and he has a irritated nail third left that is bothersome and is making shoe gear difficult.   ROS      Objective:  Physical Exam  Neurovascular status intact with patient found to have incurvated spicule left third digit that is painful when pressed and redness with irritation of the dorsal surface of both feet     Assessment:  Ingrown toenail deformity of the third left and dermatitis irritation dorsal     Plan:  H&P reviewed condition and recommended oral medications for the problem of the foot and we will start him on Lamisil and recommended correction of the nail deformity and patient wants this done and I infiltrated the left third digit 60 mg like Marcaine mixture sterile prep applied and using sterile instrumentation I removed the spicule exposed matrix and applied phenol 3 applications 30 seconds followed by alcohol lavage and sterile dressing.  Patient is instructed to leave dressing on 24 hours take off earlier if needed and will be seen back as needed

## 2020-04-17 ENCOUNTER — Ambulatory Visit
Admission: RE | Admit: 2020-04-17 | Discharge: 2020-04-17 | Disposition: A | Discharge status: Home / Self Care | Payer: Medicare HMO | Source: Ambulatory Visit | Attending: Physician Assistant | Admitting: Physician Assistant

## 2020-04-17 DIAGNOSIS — R1310 Dysphagia, unspecified: Secondary | ICD-10-CM

## 2020-07-03 ENCOUNTER — Other Ambulatory Visit (HOSPITAL_COMMUNITY): Payer: Self-pay | Admitting: *Deleted

## 2020-07-03 DIAGNOSIS — R1310 Dysphagia, unspecified: Secondary | ICD-10-CM

## 2020-07-09 ENCOUNTER — Ambulatory Visit (HOSPITAL_COMMUNITY)
Admission: RE | Admit: 2020-07-09 | Discharge: 2020-07-09 | Disposition: A | Discharge status: Home / Self Care | Payer: Medicare HMO | Source: Ambulatory Visit | Attending: Gastroenterology | Admitting: Gastroenterology

## 2020-07-09 ENCOUNTER — Other Ambulatory Visit: Payer: Self-pay

## 2020-07-09 DIAGNOSIS — R1310 Dysphagia, unspecified: Secondary | ICD-10-CM | POA: Diagnosis present

## 2020-07-09 NOTE — Progress Notes (Signed)
Modified Barium Swallow Progress Note  Patient Details  Name: Kyle Davidson MRN: [id number] Date of Birth: [birth year]  Today's Date: 07/09/2020  Modified Barium Swallow completed.  Full report located under Chart Review in the Imaging Section.  Brief recommendations include the following:  Clinical Impression  Pt was seen for MBS which revealed a functional oropharyngeal swallow. Cervical fusion was observed at C6-7. Both oral and pharyngeal phases of swallowing were observed to be timely and no aspiration was observed. Instances of trace, flash penetration were observed with multiple sips of thin liquid. Given history of esophageal symptoms, pt was educated on esophageal dysphagia. No SLP f/u warranted.    Swallow Evaluation Recommendations       SLP Diet Recommendations: Regular solids;Thin liquid   Liquid Administration via: Straw;Cup   Medication Administration: Whole meds with liquid   Supervision: Patient able to self feed   Compensations: Slow rate;Small sips/bites   Postural Changes: Seated upright at 90 degrees   Oral Care Recommendations: Oral care BID        Greggory Keen 07/09/2020,12:50 PM

## 2020-09-10 ENCOUNTER — Telehealth: Payer: Self-pay | Admitting: Cardiovascular Disease
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
## 2020-09-17 NOTE — Telephone Encounter (Signed)
Agree with note by Eligha Bridegroom, RN. Will assess him on Jan 7

## 2020-09-17 NOTE — Telephone Encounter (Signed)
Called patient and advised no additional advice from Dr. Elease Hashimoto and appointment verified for January 7. Patient states he is doing fine and will look forward to seeing Dr. Elease Hashimoto at that time.

## 2020-09-28 ENCOUNTER — Encounter: Payer: Self-pay | Admitting: *Deleted

## 2020-09-28 ENCOUNTER — Other Ambulatory Visit: Payer: Self-pay

## 2020-09-28 ENCOUNTER — Ambulatory Visit: Payer: Medicare HMO | Admitting: Cardiovascular Disease

## 2020-09-28 ENCOUNTER — Encounter: Payer: Self-pay | Admitting: Cardiovascular Disease

## 2020-09-28 VITALS — BP 128/92 | HR 84 | Ht 75.0 in | Wt 214.0 lb

## 2020-09-28 DIAGNOSIS — R0602 Shortness of breath: Secondary | ICD-10-CM

## 2020-09-28 DIAGNOSIS — R072 Precordial pain: Secondary | ICD-10-CM | POA: Diagnosis not present
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
## 2020-09-28 NOTE — Patient Instructions (Signed)
Medication Instructions:  Your physician has recommended you make the following change in your medication:  1.) stop aspirin  *If you need a refill on your cardiac medications before your next appointment, please call your pharmacy*   Lab Work: none If you have labs (blood work) drawn today and your tests are completely normal, you will receive your results only by: Marland Kitchen MyChart Message (if you have MyChart) OR . A paper copy in the mail If you have any lab test that is abnormal or we need to change your treatment, we will call you to review the results.   Testing/Procedures: Your physician has requested that you have an echocardiogram. Echocardiography is a painless test that uses sound waves to create images of your heart. It provides your doctor with information about the size and shape of your heart and how well your heart's chambers and valves are working. This procedure takes approximately one hour. There are no restrictions for this procedure.  Your physician has requested that you have a lexiscan myoview. For further information please visit [email or web address]. Please follow instruction sheet, as given.   Follow-Up: At [facility], you and your health needs are our priority.  As part of our continuing mission to provide you with exceptional heart care, we have created designated Provider Care Teams.  These Care Teams include your primary Cardiologist (physician) and Advanced Practice Providers (APPs -  Physician Assistants and Nurse Practitioners) who all work together to provide you with the care you need, when you need it.  We recommend signing up for the patient portal called "MyChart".  Sign up information is provided on this After Visit Summary.  MyChart is used to connect with patients for Virtual Visits (Telemedicine).  Patients are able to view lab/test results, encounter notes, upcoming appointments, etc.  Non-urgent messages can be sent to your provider as well.   To learn  more about what you can do with MyChart, go to [email or web address].    Your next appointment:   12 month(s)  The format for your next appointment:   In Person  Provider:   You will see one of the following Advanced Practice Providers on your designated Care Team:    Richardson Dopp, PA-C  Robbie Lis, [place]   Other Instructions

## 2020-10-17 ENCOUNTER — Telehealth (HOSPITAL_COMMUNITY): Payer: Self-pay | Admitting: *Deleted

## 2020-10-17 NOTE — Telephone Encounter (Signed)
Patient given detailed instructions per Myocardial Perfusion Study Information Sheet for the test on 10/22/20. Patient notified to arrive 15 minutes early and that it is imperative to arrive on time for appointment to keep from having the test rescheduled.  If you need to cancel or reschedule your appointment, please call the office within 24 hours of your appointment. . Patient verbalized understanding. Kyle Davidson    

## 2020-10-22 ENCOUNTER — Ambulatory Visit (HOSPITAL_COMMUNITY): Payer: Medicare HMO | Attending: Cardiovascular Disease

## 2020-10-22 ENCOUNTER — Ambulatory Visit (HOSPITAL_BASED_OUTPATIENT_CLINIC_OR_DEPARTMENT_OTHER): Payer: Medicare HMO

## 2020-10-22 ENCOUNTER — Other Ambulatory Visit: Payer: Self-pay

## 2020-10-22 ENCOUNTER — Ambulatory Visit: Payer: Medicare HMO | Admitting: Cardiovascular Disease

## 2020-10-22 DIAGNOSIS — R072 Precordial pain: Secondary | ICD-10-CM | POA: Diagnosis not present

## 2020-10-22 DIAGNOSIS — R0602 Shortness of breath: Secondary | ICD-10-CM | POA: Insufficient documentation

## 2020-10-22 LAB — MYOCARDIAL PERFUSION IMAGING
LV dias vol: 71 mL (ref 62–150)
LV sys vol: 22 mL
Peak HR: 112 {beats}/min
Rest HR: 80 {beats}/min
SDS: 3
SRS: 0
SSS: 3
TID: 0.94

## 2020-10-22 LAB — ECHOCARDIOGRAM COMPLETE
Area-P 1/2: 2.91 cm2
Height: 75 in
S' Lateral: 2.5 cm
Weight: 3424 oz

## 2020-10-22 MED ORDER — TECHNETIUM TC 99M TETROFOSMIN IV KIT
8.6000 | PACK | Freq: Once | INTRAVENOUS | Status: AC | PRN
  Administered 2020-10-22: 8.6 via INTRAVENOUS
  Filled 2020-10-22: qty 9

## 2020-10-22 MED ORDER — REGADENOSON 0.4 MG/5ML IV SOLN
0.4000 mg | Freq: Once | INTRAVENOUS | Status: AC
  Administered 2020-10-22: 0.4 mg via INTRAVENOUS

## 2020-10-22 MED ORDER — TECHNETIUM TC 99M TETROFOSMIN IV KIT
25.7000 | PACK | Freq: Once | INTRAVENOUS | Status: AC | PRN
  Administered 2020-10-22: 25.7 via INTRAVENOUS
  Filled 2020-10-22: qty 26

## 2020-10-23 ENCOUNTER — Telehealth: Payer: Self-pay

## 2020-10-23 NOTE — Telephone Encounter (Signed)
Spoke with pt and advised per Dr Acie Fredrickson echo shows normal LV function with mild aortic dilatation. Pt's Myoview shows low risk.  Pt reports he has stopped his ASA per Dr Elmarie Shiley recommendation.  Pt verbalizes understanding and thanked Therapist, sports for the call.

## 2020-10-23 NOTE — Telephone Encounter (Signed)
Spoke with pt and advised per Dr Acie Fredrickson echo shows normal LV function with mild aortic dilatation.  Pt reports he has stopped his ASA per Dr Elmarie Shiley recommendation.  Pt verbalizes understanding and thanked Therapist, sports for the call.

## 2020-10-23 NOTE — Telephone Encounter (Signed)
-----   Message from Thayer Headings, MD sent at 10/22/2020  5:21 PM EST ----- Normal LV function .  Mild aortic dilatation  - 42 mm

## 2020-10-23 NOTE — Telephone Encounter (Signed)
-----   Message from Thayer Headings, MD sent at 10/22/2020  5:18 PM EST ----- Low risk myoview

## 2020-11-05 DIAGNOSIS — L821 Other seborrheic keratosis: Secondary | ICD-10-CM | POA: Insufficient documentation

## 2020-11-05 DIAGNOSIS — L570 Actinic keratosis: Secondary | ICD-10-CM | POA: Insufficient documentation

## 2020-12-27 DIAGNOSIS — J0101 Acute recurrent maxillary sinusitis: Secondary | ICD-10-CM | POA: Diagnosis not present

## 2021-01-28 DIAGNOSIS — I77819 Aortic ectasia, unspecified site: Secondary | ICD-10-CM | POA: Insufficient documentation

## 2021-01-28 DIAGNOSIS — R059 Cough, unspecified: Secondary | ICD-10-CM | POA: Diagnosis not present

## 2021-01-28 DIAGNOSIS — R198 Other specified symptoms and signs involving the digestive system and abdomen: Secondary | ICD-10-CM | POA: Insufficient documentation

## 2021-02-20 DIAGNOSIS — Z125 Encounter for screening for malignant neoplasm of prostate: Secondary | ICD-10-CM | POA: Diagnosis not present

## 2021-02-20 DIAGNOSIS — R7309 Other abnormal glucose: Secondary | ICD-10-CM | POA: Diagnosis not present

## 2021-02-20 DIAGNOSIS — Z87898 Personal history of other specified conditions: Secondary | ICD-10-CM | POA: Diagnosis not present

## 2021-02-20 DIAGNOSIS — Z1159 Encounter for screening for other viral diseases: Secondary | ICD-10-CM | POA: Diagnosis not present

## 2021-02-20 DIAGNOSIS — Z0000 Encounter for general adult medical examination without abnormal findings: Secondary | ICD-10-CM | POA: Diagnosis not present

## 2021-02-20 DIAGNOSIS — B353 Tinea pedis: Secondary | ICD-10-CM | POA: Diagnosis not present

## 2021-02-20 DIAGNOSIS — E781 Pure hyperglyceridemia: Secondary | ICD-10-CM | POA: Diagnosis not present

## 2021-02-20 DIAGNOSIS — K219 Gastro-esophageal reflux disease without esophagitis: Secondary | ICD-10-CM | POA: Diagnosis not present

## 2021-02-20 DIAGNOSIS — R059 Cough, unspecified: Secondary | ICD-10-CM | POA: Diagnosis not present

## 2021-02-25 DIAGNOSIS — R079 Chest pain, unspecified: Secondary | ICD-10-CM | POA: Diagnosis not present

## 2021-02-25 DIAGNOSIS — R194 Change in bowel habit: Secondary | ICD-10-CM | POA: Diagnosis not present

## 2021-02-25 DIAGNOSIS — R053 Chronic cough: Secondary | ICD-10-CM | POA: Diagnosis not present

## 2021-02-25 DIAGNOSIS — K219 Gastro-esophageal reflux disease without esophagitis: Secondary | ICD-10-CM | POA: Diagnosis not present

## 2021-02-25 DIAGNOSIS — Z9889 Other specified postprocedural states: Secondary | ICD-10-CM | POA: Diagnosis not present

## 2021-02-25 DIAGNOSIS — R1312 Dysphagia, oropharyngeal phase: Secondary | ICD-10-CM | POA: Diagnosis not present

## 2021-02-28 ENCOUNTER — Ambulatory Visit (INDEPENDENT_AMBULATORY_CARE_PROVIDER_SITE_OTHER): Payer: Medicare HMO

## 2021-02-28 ENCOUNTER — Encounter: Payer: Self-pay | Admitting: Emergency Medicine

## 2021-02-28 ENCOUNTER — Ambulatory Visit: Payer: Medicare HMO | Admitting: Emergency Medicine

## 2021-02-28 ENCOUNTER — Other Ambulatory Visit: Payer: Self-pay

## 2021-02-28 DIAGNOSIS — R21 Rash and other nonspecific skin eruption: Secondary | ICD-10-CM | POA: Diagnosis not present

## 2021-02-28 DIAGNOSIS — R059 Cough, unspecified: Secondary | ICD-10-CM | POA: Diagnosis not present

## 2021-02-28 DIAGNOSIS — R053 Chronic cough: Secondary | ICD-10-CM | POA: Insufficient documentation

## 2021-02-28 MED ORDER — FLUTICASONE PROPIONATE 50 MCG/ACT NA SUSP: 2.0000 | Freq: Every day | NASAL | 2 refills | Status: DC

## 2021-02-28 MED ORDER — LORATADINE 10 MG PO TABS: 10.0000 mg | ORAL_TABLET | Freq: Every day | ORAL | 5 refills | Status: DC
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
## 2021-02-28 NOTE — Patient Instructions (Signed)
Please continue your pantoprazole 40 mg twice a day.  Take this medication 1 hour around food. Agree with further gastroenterology evaluation with Dr Alessandra Bevels We will perform pulmonary function testing in next office visit We will perform a chest x-ray today Please start loratadine 10 mg (generic Claritin) once daily until next visit Please start fluticasone nasal spray, 2 sprays each nostril once daily until next visit. Follow with Dr. Lamonte Sakai next available with full pulmonary function testing on the same day.

## 2021-02-28 NOTE — Assessment & Plan Note (Signed)
Chronic cough that sounds upper airway in nature, has been an issue ever since he had a cervical spine surgery.  He has a globus sensation, reflux esophagitis appears to be at least in part a contributor.  Also possible intermittent aspiration or at least dysphagia.  He is following with gastroenterology and is on PPI twice daily.  Suspect that chronic rhinitis is a contributor.  His cough was even worse when he had either an upper respiratory infection or sinusitis (which was treated).  He needs a chest x-ray to rule out any evolving parenchymal abnormality.  We will perform pulmonary function testing to evaluate for airflow abnormality.  In the meantime I will try adding a rhinitis regimen to see if we can decrease upper airway irritation, help with cough.  If he continues to cough then we will consider other evaluation, possibly even bronchoscopy.  Please continue your pantoprazole 40 mg twice a day.  Take this medication 1 hour around food. Agree with further gastroenterology evaluation with Dr Alessandra Bevels We will perform pulmonary function testing in next office visit We will perform a chest x-ray today Please start loratadine 10 mg (generic Claritin) once daily until next visit Please start fluticasone nasal spray, 2 sprays each nostril once daily until next visit. Follow with Dr. Lamonte Sakai next available with full pulmonary function testing on the same day.

## 2021-03-07 ENCOUNTER — Telehealth: Payer: Self-pay | Admitting: Emergency Medicine

## 2021-03-07 NOTE — Telephone Encounter (Signed)
Please let him know his CXR is normal.

## 2021-03-07 NOTE — Telephone Encounter (Signed)
Called and spoke with patient. He was calling to get the results of his CXR from 02/28/21.   RB, can you please advise? Thanks.

## 2021-03-08 NOTE — Telephone Encounter (Signed)
Called and spoke with patient, advised of results of cxr per Dr. Lamonte Sakai.  He verbalized understanding.  Nothing further needed.

## 2021-03-22 DIAGNOSIS — K648 Other hemorrhoids: Secondary | ICD-10-CM | POA: Diagnosis not present

## 2021-03-22 DIAGNOSIS — D123 Benign neoplasm of transverse colon: Secondary | ICD-10-CM | POA: Diagnosis not present

## 2021-03-22 DIAGNOSIS — K2930 Chronic superficial gastritis without bleeding: Secondary | ICD-10-CM | POA: Diagnosis not present

## 2021-03-22 DIAGNOSIS — R1313 Dysphagia, pharyngeal phase: Secondary | ICD-10-CM | POA: Diagnosis not present

## 2021-03-22 DIAGNOSIS — K635 Polyp of colon: Secondary | ICD-10-CM | POA: Diagnosis not present

## 2021-03-22 DIAGNOSIS — D120 Benign neoplasm of cecum: Secondary | ICD-10-CM | POA: Diagnosis not present

## 2021-03-22 DIAGNOSIS — D124 Benign neoplasm of descending colon: Secondary | ICD-10-CM | POA: Diagnosis not present

## 2021-03-22 DIAGNOSIS — K6389 Other specified diseases of intestine: Secondary | ICD-10-CM | POA: Diagnosis not present

## 2021-03-22 DIAGNOSIS — R1312 Dysphagia, oropharyngeal phase: Secondary | ICD-10-CM | POA: Diagnosis not present

## 2021-03-22 DIAGNOSIS — R194 Change in bowel habit: Secondary | ICD-10-CM | POA: Diagnosis not present

## 2021-03-22 DIAGNOSIS — R12 Heartburn: Secondary | ICD-10-CM | POA: Diagnosis not present

## 2021-03-22 DIAGNOSIS — D128 Benign neoplasm of rectum: Secondary | ICD-10-CM | POA: Diagnosis not present

## 2021-03-28 DIAGNOSIS — D124 Benign neoplasm of descending colon: Secondary | ICD-10-CM | POA: Diagnosis not present

## 2021-03-28 DIAGNOSIS — K2930 Chronic superficial gastritis without bleeding: Secondary | ICD-10-CM | POA: Diagnosis not present

## 2021-03-28 DIAGNOSIS — D120 Benign neoplasm of cecum: Secondary | ICD-10-CM | POA: Diagnosis not present

## 2021-03-28 DIAGNOSIS — K635 Polyp of colon: Secondary | ICD-10-CM | POA: Diagnosis not present

## 2021-04-09 ENCOUNTER — Other Ambulatory Visit (HOSPITAL_COMMUNITY)
Admission: RE | Admit: 2021-04-09 | Discharge: 2021-04-09 | Disposition: A | Discharge status: Home / Self Care | Payer: Medicare HMO | Source: Ambulatory Visit | Attending: Emergency Medicine | Admitting: Emergency Medicine

## 2021-04-09 DIAGNOSIS — Z01812 Encounter for preprocedural laboratory examination: Secondary | ICD-10-CM | POA: Diagnosis not present

## 2021-04-09 DIAGNOSIS — Z20822 Contact with and (suspected) exposure to covid-19: Secondary | ICD-10-CM | POA: Insufficient documentation

## 2021-04-09 LAB — SARS CORONAVIRUS 2 (TAT 6-24 HRS): SARS Coronavirus 2: NEGATIVE

## 2021-04-12 ENCOUNTER — Other Ambulatory Visit: Payer: Self-pay

## 2021-04-12 ENCOUNTER — Ambulatory Visit: Payer: Medicare HMO | Admitting: Emergency Medicine

## 2021-04-12 ENCOUNTER — Ambulatory Visit (INDEPENDENT_AMBULATORY_CARE_PROVIDER_SITE_OTHER): Payer: Medicare HMO | Admitting: Emergency Medicine

## 2021-04-12 ENCOUNTER — Encounter: Payer: Self-pay | Admitting: Emergency Medicine

## 2021-04-12 DIAGNOSIS — R053 Chronic cough: Secondary | ICD-10-CM

## 2021-04-12 DIAGNOSIS — K219 Gastro-esophageal reflux disease without esophagitis: Secondary | ICD-10-CM

## 2021-04-12 DIAGNOSIS — J310 Chronic rhinitis: Secondary | ICD-10-CM

## 2021-04-12 LAB — PULMONARY FUNCTION TEST
DL/VA % pred: 112 %
DL/VA: 4.47 ml/min/mmHg/L
DLCO cor % pred: 112 %
DLCO cor: 32.87 ml/min/mmHg
DLCO unc % pred: 121 %
DLCO unc: 35.42 ml/min/mmHg
FEF 25-75 Post: 3.27 L/sec
FEF 25-75 Pre: 3.35 L/sec
FEF2575-%Change-Post: -2 %
FEF2575-%Pred-Post: 114 %
FEF2575-%Pred-Pre: 117 %
FEV1-%Change-Post: -1 %
FEV1-%Pred-Post: 90 %
FEV1-%Pred-Pre: 91 %
FEV1-Post: 3.42 L
FEV1-Pre: 3.46 L
FEV1FVC-%Change-Post: 1 %
FEV1FVC-%Pred-Pre: 109 %
FEV6-%Change-Post: -3 %
FEV6-%Pred-Post: 86 %
FEV6-%Pred-Pre: 88 %
FEV6-Post: 4.17 L
FEV6-Pre: 4.31 L
FEV6FVC-%Pred-Post: 105 %
FEV6FVC-%Pred-Pre: 105 %
FVC-%Change-Post: -3 %
FVC-%Pred-Post: 81 %
FVC-%Pred-Pre: 84 %
FVC-Post: 4.17 L
FVC-Pre: 4.31 L
Post FEV1/FVC ratio: 82 %
Post FEV6/FVC ratio: 100 %
Pre FEV1/FVC ratio: 80 %
Pre FEV6/FVC Ratio: 100 %
RV % pred: 144 %
RV: 3.86 L
TLC % pred: 107 %
TLC: 8.42 L

## 2021-04-12 MED ORDER — FLUTICASONE PROPIONATE 50 MCG/ACT NA SUSP: 2.0000 | Freq: Every day | NASAL | 5 refills | Status: DC

## 2021-04-12 MED ORDER — MONTELUKAST SODIUM 10 MG PO TABS: 10.0000 mg | ORAL_TABLET | Freq: Every day | ORAL | 1 refills | Status: DC

## 2021-04-12 NOTE — Progress Notes (Signed)
Full PFT performed today. °
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
## 2021-04-12 NOTE — Patient Instructions (Signed)
Full PFT performed today. °

## 2021-04-12 NOTE — Assessment & Plan Note (Signed)
Continue pantoprazole 40 mg twice daily

## 2021-04-12 NOTE — Patient Instructions (Addendum)
Your pulmonary function testing today is reassuring.  There is no evidence of asthma or COPD.  We do not need to start any inhaler medication. Please continue your Flonase nasal spray, 2 sprays each nostril twice a day. Continue loratadine 10 mg once daily. You may benefit from trying nasal saline rinses once daily if you believe you can perform We will add montelukast 10 mg each evening. Please continue your pantoprazole 40 mg twice a day.  Take this medication 1 hour around food. Avoid throat clearing. Follow Dr. Lamonte Sakai in about 3 months so that we can review your symptoms.

## 2021-04-12 NOTE — Assessment & Plan Note (Signed)
Somewhat better compared with last time but still active.  Asked him to consider adding nasal saline rinses, will add Singulair to his fluticasone nasal spray and loratadine.  Question whether he may need allergy testing at some point going forward depending on degree of control.

## 2021-04-12 NOTE — Assessment & Plan Note (Signed)
Somewhat better but still active.  He has a globus sensation, has to clear mucus, no purulence.  The mucus burden is better as mentioned since he started a more aggressive allergy rhinitis regimen.  Occasional breakthrough GERD but not frequently.  Pulmonary function testing today reassuring.

## 2021-05-14 DIAGNOSIS — H938X3 Other specified disorders of ear, bilateral: Secondary | ICD-10-CM | POA: Diagnosis not present

## 2021-05-14 DIAGNOSIS — B353 Tinea pedis: Secondary | ICD-10-CM | POA: Diagnosis not present

## 2021-05-14 DIAGNOSIS — L219 Seborrheic dermatitis, unspecified: Secondary | ICD-10-CM | POA: Diagnosis not present

## 2021-05-14 DIAGNOSIS — R519 Headache, unspecified: Secondary | ICD-10-CM | POA: Diagnosis not present

## 2021-05-22 ENCOUNTER — Ambulatory Visit: Payer: Medicare HMO | Admitting: Podiatry

## 2021-05-22 ENCOUNTER — Encounter: Payer: Self-pay | Admitting: Podiatry

## 2021-05-22 ENCOUNTER — Other Ambulatory Visit: Payer: Self-pay

## 2021-05-22 DIAGNOSIS — L600 Ingrowing nail: Secondary | ICD-10-CM

## 2021-05-22 NOTE — Patient Instructions (Signed)

## 2021-05-22 NOTE — Progress Notes (Signed)
Subjective:   Patient ID: Kyle Davidson, male   DOB: 70 y.o.   MRN: [id number]   HPI Patient presents has dermatitis on his left foot that is being treated by dermatologist and has a incurvated medial border of the third nail bed left that can be irritated   ROS      Objective:  Physical Exam  Neurovascular status intact with moccasin appearance left foot which is probably fungal and is on fungal medicine along with steroid cream with incurvated third nail left     Assessment:  Dermatitis chronic ingrown toenail third left     Plan:  H&P reviewed condition anesthetized the left third toe removed the medial border and applied chemical agent to create and kill the root

## 2021-05-23 DIAGNOSIS — H524 Presbyopia: Secondary | ICD-10-CM | POA: Diagnosis not present

## 2021-05-23 DIAGNOSIS — H5203 Hypermetropia, bilateral: Secondary | ICD-10-CM | POA: Diagnosis not present

## 2021-05-23 DIAGNOSIS — H52229 Regular astigmatism, unspecified eye: Secondary | ICD-10-CM | POA: Diagnosis not present

## 2021-05-31 IMAGING — CR DG CHEST 2V
2 series · 2 of 2 positions shown · non-contrast
Comparison: Two-view chest x-ray 11/01/2019

CLINICAL DATA: Pre admit for low back surgery.  Bronchitis.

EXAM:
CHEST - 2 VIEW

[w chest pa]
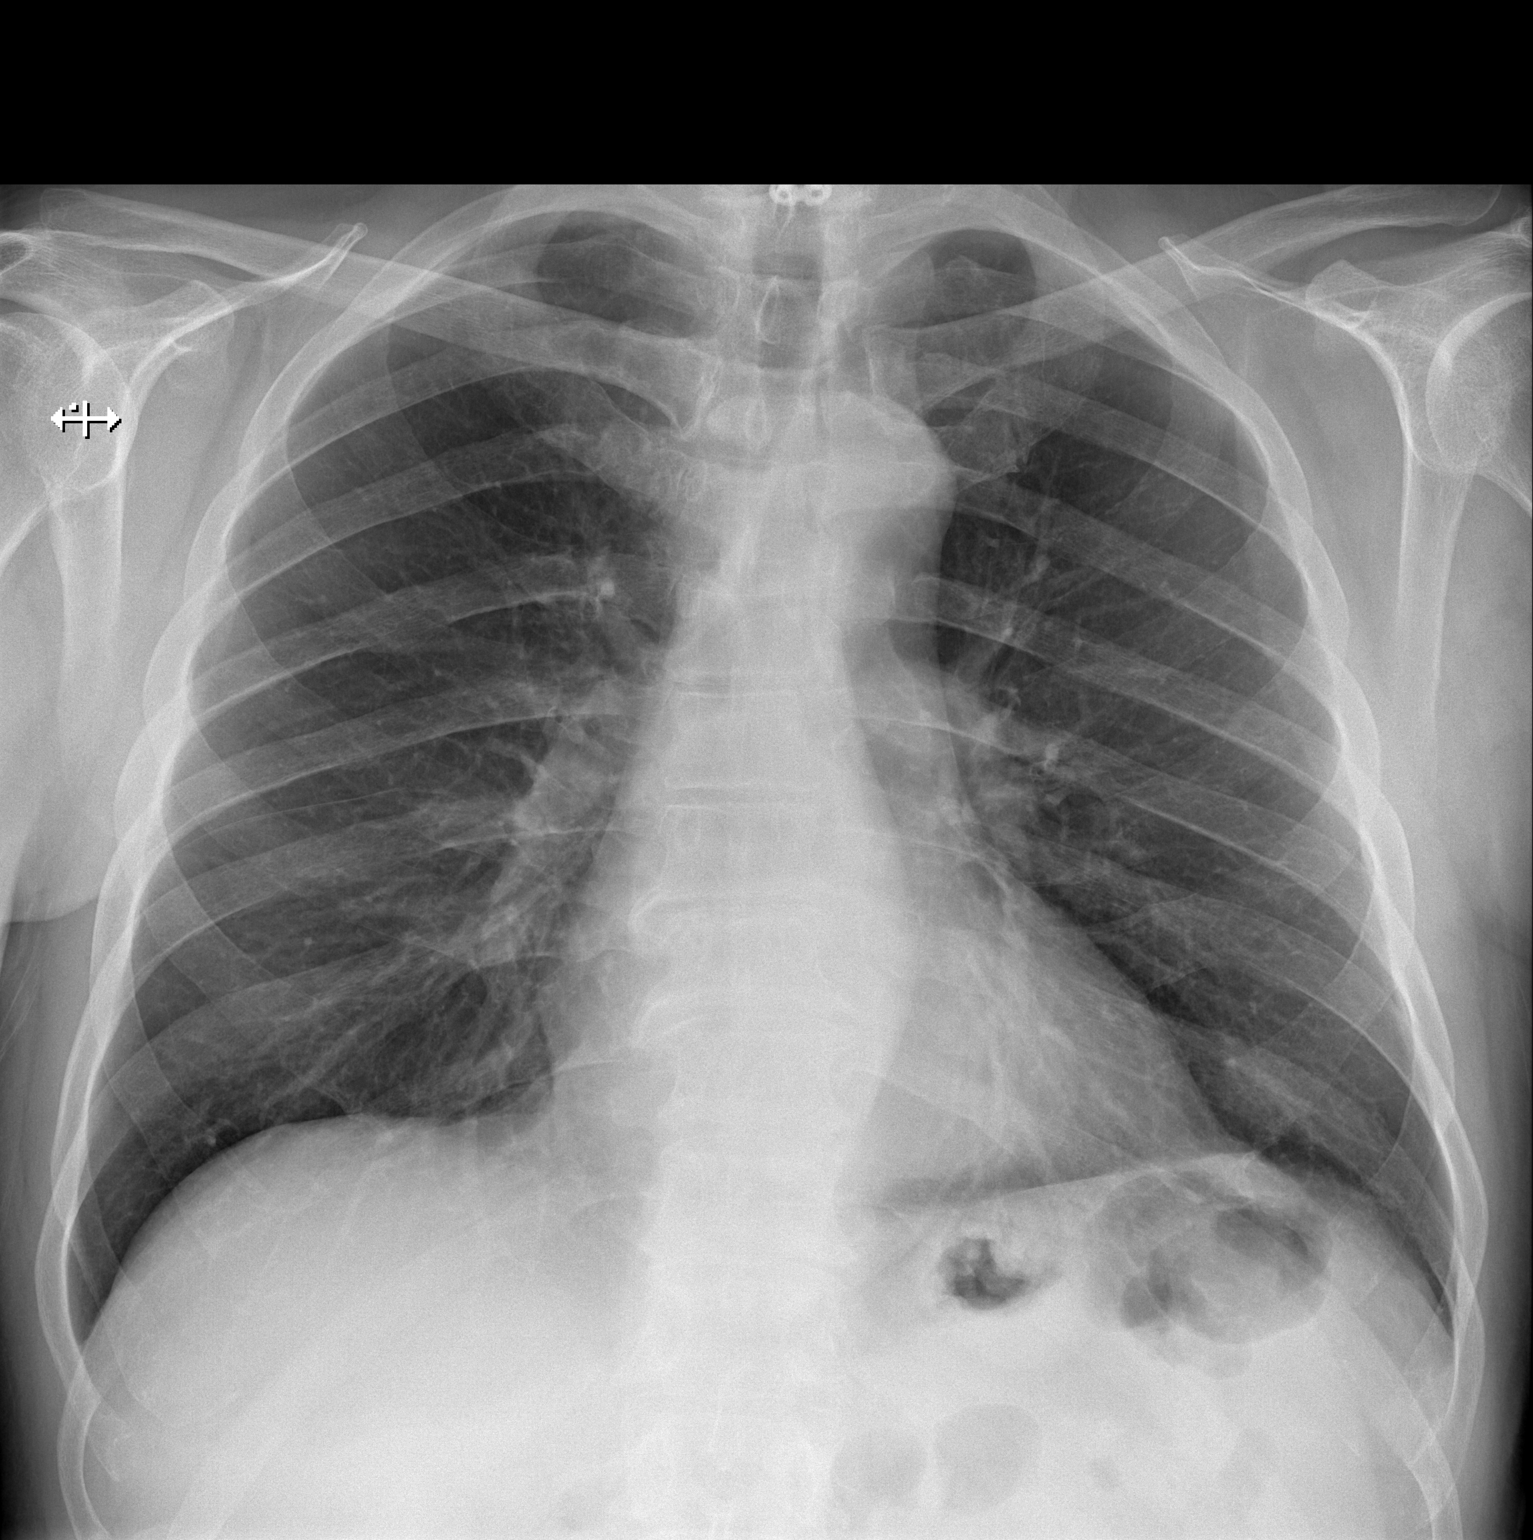

[w chest lat]
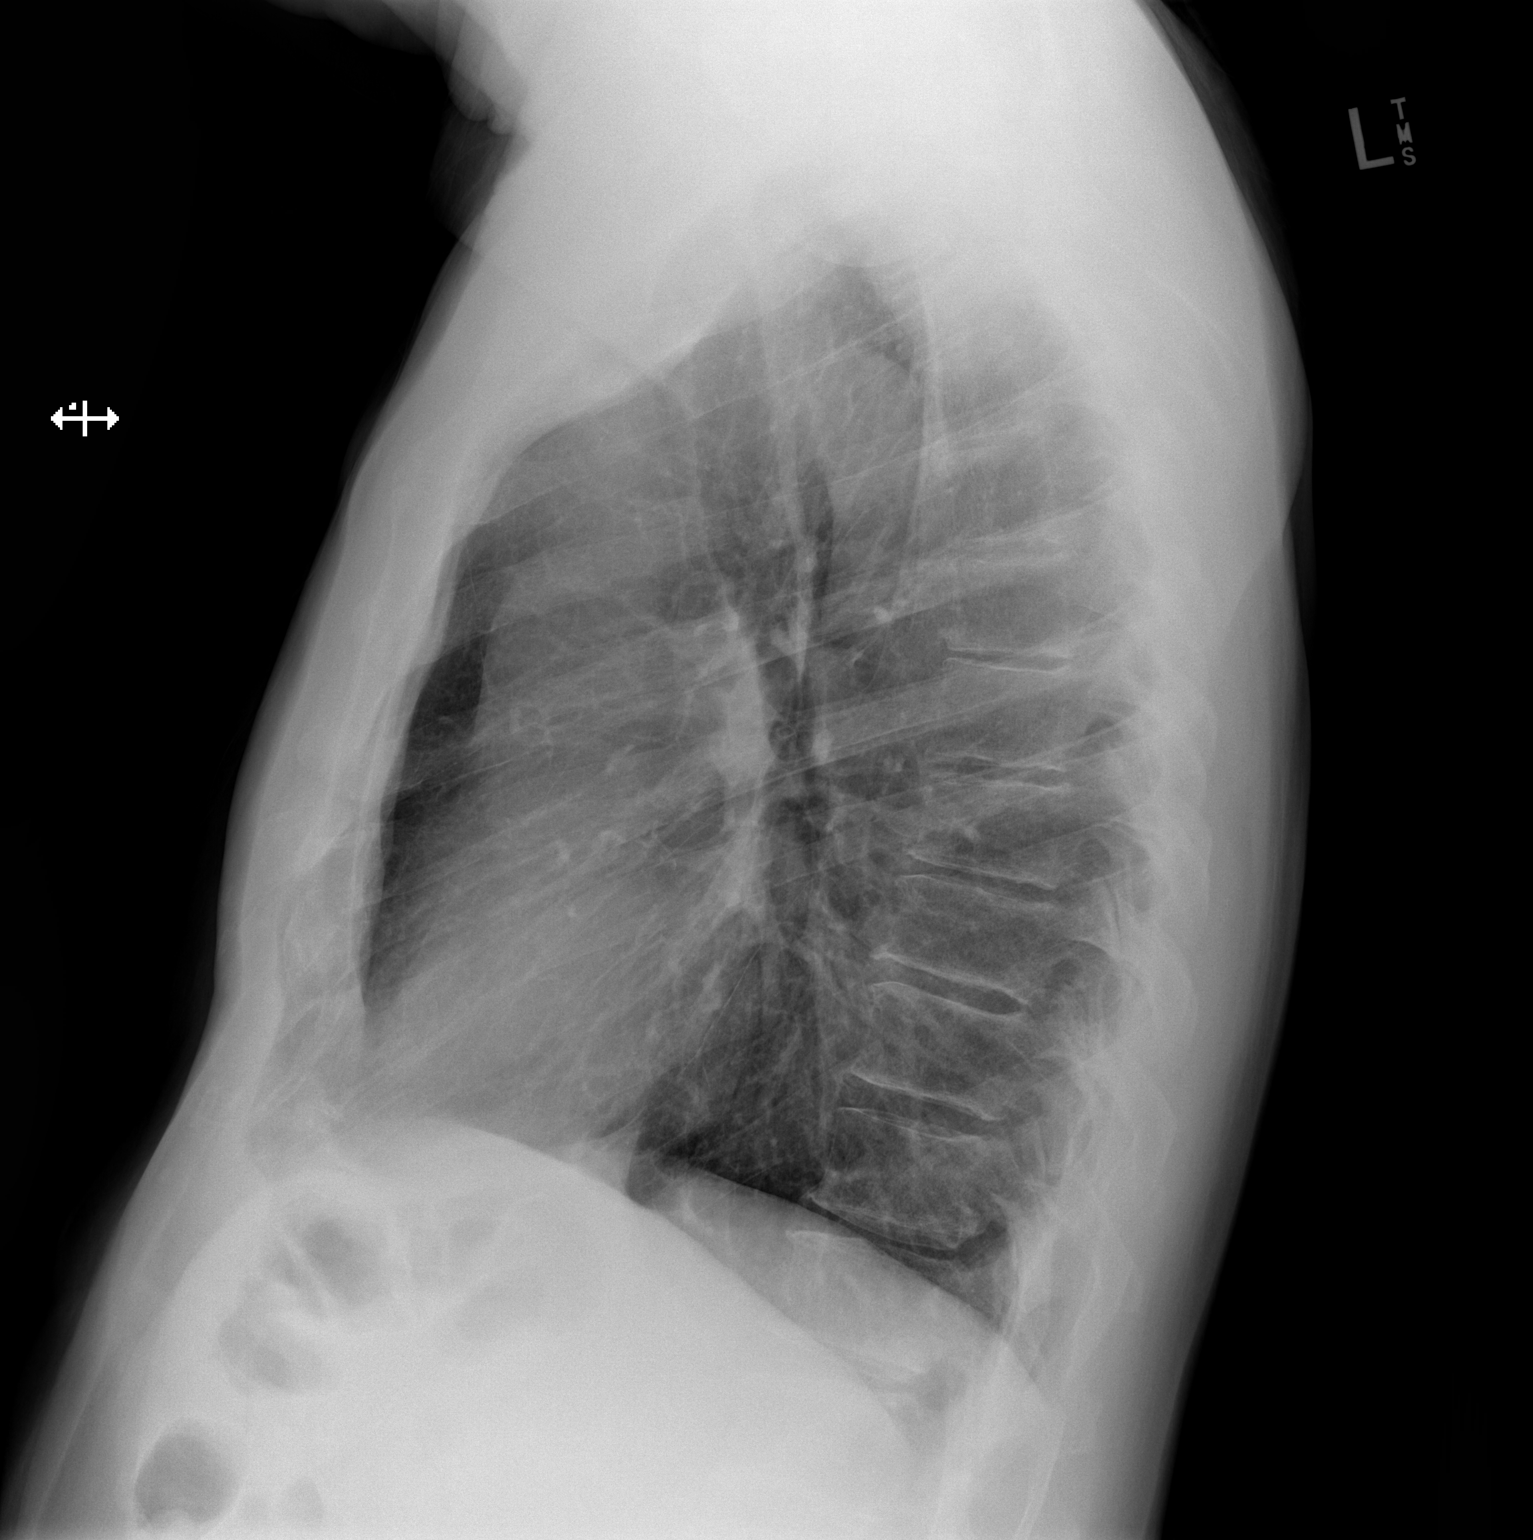

[2 of 2 positions shown; findings below may reference images not displayed]

FINDINGS: The heart size and mediastinal contours are within normal limits.
Both lungs are clear. The visualized skeletal structures are
unremarkable.
IMPRESSION: Negative two view chest x-ray

## 2021-06-03 IMAGING — RF DG C-ARM 1-60 MIN
1 series · 2 of 2 positions shown · non-contrast
Comparison: None.

CLINICAL DATA: Portable operative imaging provided for L3-L4
posterior lumbar spine fusion.

EXAM:
LUMBAR SPINE - 2-3 VIEW; DG C-ARM 1-60 MIN

[Series 1: run · 2 of 2 slices shown]
[im 1/2]
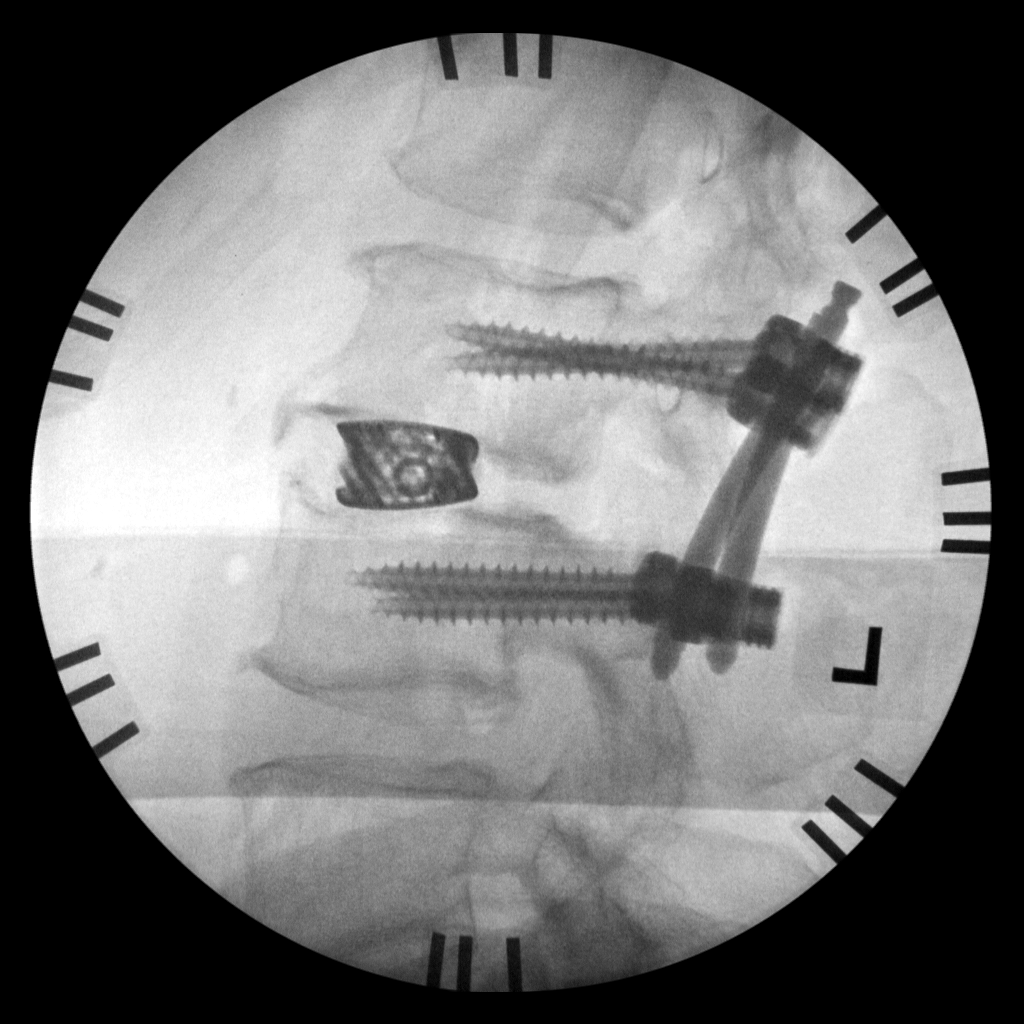
[im 2/2]
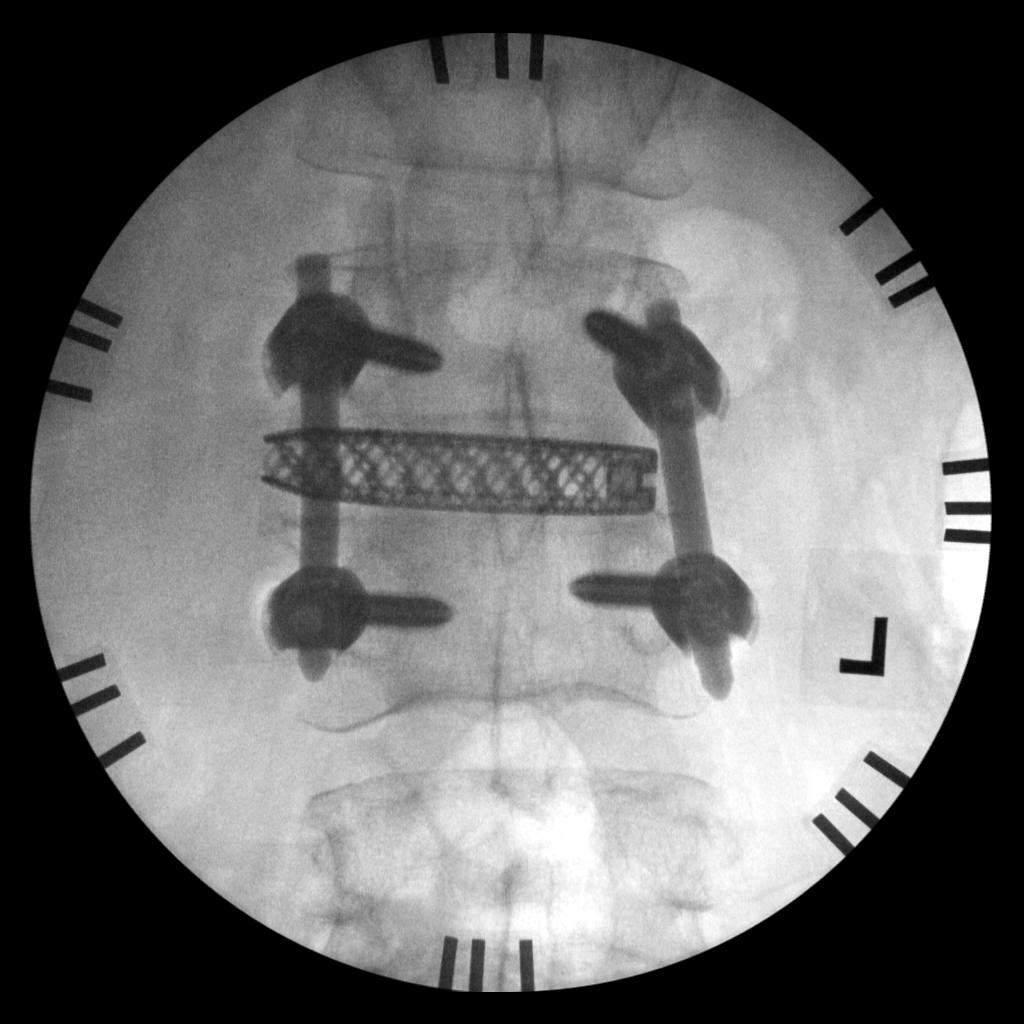

[2 of 2 positions shown; findings below may reference images not displayed]

FINDINGS: Two submitted images show placement bilateral pedicle screws,
reportedly at L3 and L4, which appear well seated and
well-positioned. There is an intervertebral cage as well centered
partly maintaining disc height at the fused level.
IMPRESSION: 1. Operative imaging provided for lumbar spine posterior fusion.
Orthopedic hardware appears well positioned.

## 2021-06-03 IMAGING — CR DG OR LOCAL ABDOMEN
1 series · 1 of 1 positions shown · non-contrast
Comparison: Earlier same day

CLINICAL DATA: Instrument count.

EXAM:
OR LOCAL ABDOMEN

[ap rld]
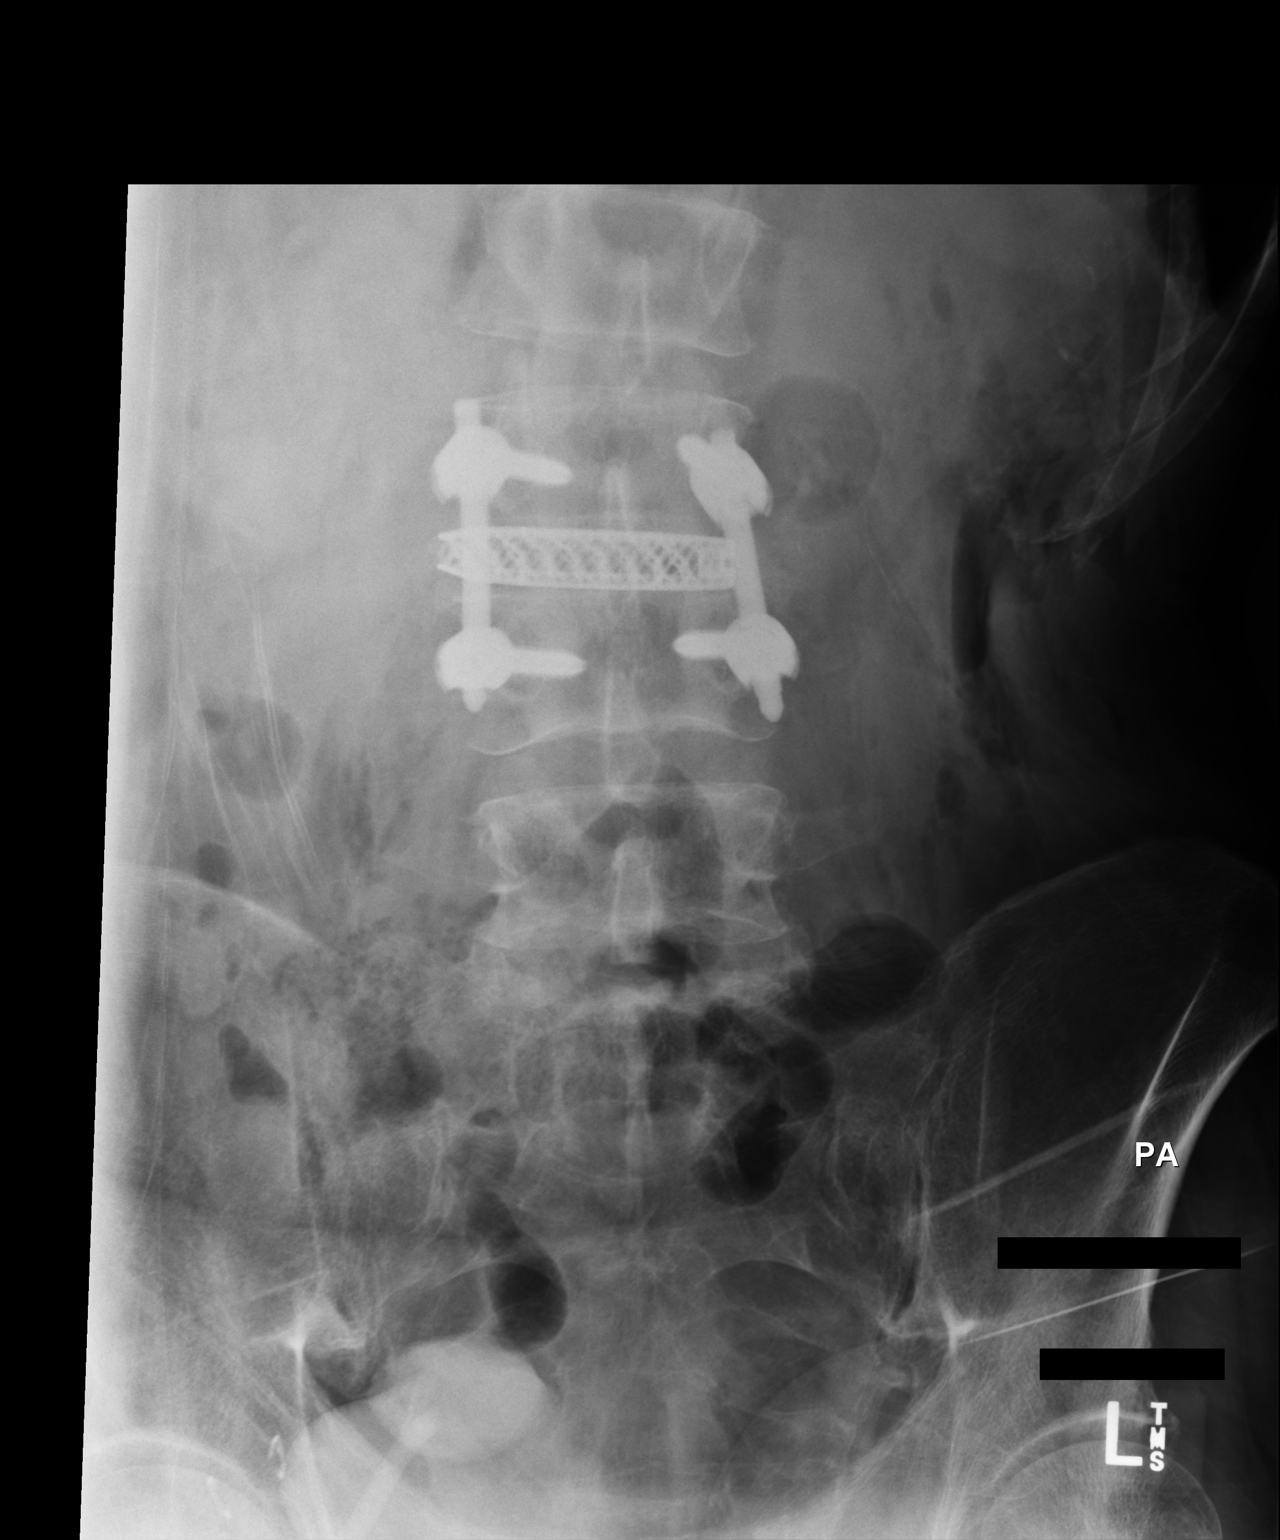

[1 of 1 positions shown; findings below may reference images not displayed]

FINDINGS: Single cross-table portable view of the abdomen shows lumbar fusion
hardware at the L3-4 level. 2 skin or fascial staples are seen over
the right groin region. No unexpected radiopaque foreign body.
IMPRESSION: No unexpected radiopaque foreign body.

I personally called these results to [place], in the operating room, at

## 2021-06-10 DIAGNOSIS — R1032 Left lower quadrant pain: Secondary | ICD-10-CM | POA: Diagnosis not present

## 2021-06-10 DIAGNOSIS — R1033 Periumbilical pain: Secondary | ICD-10-CM | POA: Diagnosis not present

## 2021-06-14 ENCOUNTER — Other Ambulatory Visit: Payer: Self-pay | Admitting: Surgery

## 2021-06-14 DIAGNOSIS — R1033 Periumbilical pain: Secondary | ICD-10-CM

## 2021-06-14 DIAGNOSIS — R1032 Left lower quadrant pain: Secondary | ICD-10-CM

## 2021-07-02 ENCOUNTER — Ambulatory Visit
Admission: RE | Admit: 2021-07-02 | Discharge: 2021-07-02 | Disposition: A | Discharge status: Home / Self Care | Payer: Medicare HMO | Source: Ambulatory Visit | Attending: Surgery | Admitting: Surgery

## 2021-07-02 ENCOUNTER — Other Ambulatory Visit: Payer: Self-pay

## 2021-07-02 DIAGNOSIS — K8689 Other specified diseases of pancreas: Secondary | ICD-10-CM | POA: Diagnosis not present

## 2021-07-02 DIAGNOSIS — R1032 Left lower quadrant pain: Secondary | ICD-10-CM

## 2021-07-02 DIAGNOSIS — Q8909 Congenital malformations of spleen: Secondary | ICD-10-CM | POA: Diagnosis not present

## 2021-07-02 DIAGNOSIS — K4090 Unilateral inguinal hernia, without obstruction or gangrene, not specified as recurrent: Secondary | ICD-10-CM | POA: Diagnosis not present

## 2021-07-02 DIAGNOSIS — I700 Atherosclerosis of aorta: Secondary | ICD-10-CM | POA: Diagnosis not present

## 2021-07-02 DIAGNOSIS — R1033 Periumbilical pain: Secondary | ICD-10-CM

## 2021-07-02 MED ORDER — IOPAMIDOL (ISOVUE-300) INJECTION 61%
100.0000 mL | Freq: Once | INTRAVENOUS | Status: AC | PRN
  Administered 2021-07-02: 100 mL via INTRAVENOUS

## 2021-07-04 ENCOUNTER — Encounter: Payer: Self-pay | Admitting: Surgery

## 2021-07-12 ENCOUNTER — Ambulatory Visit: Payer: Self-pay | Admitting: Surgery

## 2021-07-12 DIAGNOSIS — K4090 Unilateral inguinal hernia, without obstruction or gangrene, not specified as recurrent: Secondary | ICD-10-CM

## 2021-07-29 DIAGNOSIS — R059 Cough, unspecified: Secondary | ICD-10-CM | POA: Diagnosis not present

## 2021-07-29 DIAGNOSIS — J029 Acute pharyngitis, unspecified: Secondary | ICD-10-CM | POA: Diagnosis not present

## 2021-07-29 DIAGNOSIS — R062 Wheezing: Secondary | ICD-10-CM | POA: Diagnosis not present

## 2021-07-29 DIAGNOSIS — J069 Acute upper respiratory infection, unspecified: Secondary | ICD-10-CM | POA: Diagnosis not present

## 2021-08-09 NOTE — Progress Notes (Signed)
Surgical Instructions    Your procedure is scheduled on 08/20/21.  Report to [facility] Main Entrance "A" at 5:30 A.M., then check in with the Admitting office.  Call this number if you have problems the morning of surgery:  [phone]   If you have any questions prior to your surgery date call [phone]: Open Monday-Friday 8am-4pm    Remember:  Do not eat after midnight the night before your surgery  You may drink clear liquids until 4:30am the morning of your surgery.   Clear liquids allowed are: Water, Non-Citrus Juices (without pulp), Carbonated Beverages, Clear Tea, Black Coffee ONLY (NO MILK, CREAM OR POWDERED CREAMER of any kind), and Gatorade    Take these medicines the morning of surgery with A SIP OF WATER  fluticasone (FLONASE) loratadine (CLARITIN)  pantoprazole (PROTONIX) terbinafine (LAMISIL)  IF NEEDED: acetaminophen (TYLENOL)  guaiFENesin (MUCINEX)  As of today, STOP taking any Aspirin (unless otherwise instructed by your surgeon) Aleve, Naproxen, Ibuprofen, Motrin, Advil, Goody's, BC's, all herbal medications, fish oil, and all vitamins.    HOW TO MANAGE YOUR DIABETES BEFORE AND AFTER SURGERY  Why is it important to control my blood sugar before and after surgery? Improving blood sugar levels before and after surgery helps healing and can limit problems. A way of improving blood sugar control is eating a healthy diet by:  Eating less sugar and carbohydrates  Increasing activity/exercise  Talking with your doctor about reaching your blood sugar goals High blood sugars (greater than 180 mg/dL) can raise your risk of infections and slow your recovery, so you will need to focus on controlling your diabetes during the weeks before surgery. Make sure that the doctor who takes care of your diabetes knows about your planned surgery including the date and location.  How do I manage my blood sugar before surgery? Check your blood sugar at least 4 times a day,  starting 2 days before surgery, to make sure that the level is not too high or low.  Check your blood sugar the morning of your surgery when you wake up and every 2 hours until you get to the Short Stay unit.  If your blood sugar is less than 70 mg/dL, you will need to treat for low blood sugar: Do not take insulin. Treat a low blood sugar (less than 70 mg/dL) with  cup of clear juice (cranberry or apple), 4 glucose tablets, OR glucose gel. Recheck blood sugar in 15 minutes after treatment (to make sure it is greater than 70 mg/dL). If your blood sugar is not greater than 70 mg/dL on recheck, call [phone] for further instructions. Report your blood sugar to the short stay nurse when you get to Short Stay.  If you are admitted to the hospital after surgery: Your blood sugar will be checked by the staff and you will probably be given insulin after surgery (instead of oral diabetes medicines) to make sure you have good blood sugar levels. The goal for blood sugar control after surgery is 80-180 mg/dL.   After your COVID test   You are not required to quarantine however you are required to wear a well-fitting mask when you are out and around people not in your household.  If your mask becomes wet or soiled, replace with a new one.  Wash your hands often with soap and water for 20 seconds or clean your hands with an alcohol-based hand sanitizer that contains at least 60% alcohol.  Do not share personal items.  Notify  your provider: if you are in close contact with someone who has COVID  or if you develop a fever of 100.4 or greater, sneezing, cough, sore throat, shortness of breath or body aches.             Do not wear jewelry or makeup Do not wear lotions, powders, perfumes/colognes, or deodorant. Men may shave face and neck. Do not bring valuables to the hospital. DO Not wear nail polish, gel polish, artificial nails, or any other type of covering on natural nails including  finger and toenails. If patients have artificial nails, gel coating, etc. that need to be removed by a nail salon, please have this removed prior to surgery or surgery may need to be canceled/delayed if the surgeon/ anesthesia feels like the patient is unable to be adequately monitored.             [facility] is not responsible for any belongings or valuables.  Do NOT Smoke (Tobacco/Vaping)  24 hours prior to your procedure  If you use a CPAP at night, you may bring your mask for your overnight stay.   Contacts, glasses, hearing aids, dentures or partials may not be worn into surgery, please bring cases for these belongings   For patients admitted to the hospital, discharge time will be determined by your treatment team.   Patients discharged the day of surgery will not be allowed to drive home, and someone needs to stay with them for 24 hours.  NO VISITORS WILL BE ALLOWED IN PRE-OP WHERE PATIENTS ARE PREPPED FOR SURGERY.  ONLY 1 SUPPORT PERSON MAY BE PRESENT IN THE WAITING ROOM WHILE YOU ARE IN SURGERY.  IF YOU ARE TO BE ADMITTED, ONCE YOU ARE IN YOUR ROOM YOU WILL BE ALLOWED TWO (2) VISITORS. 1 (ONE) VISITOR MAY STAY OVERNIGHT BUT MUST ARRIVE TO THE ROOM BY 8pm.  Minor children may have two parents present. Special consideration for safety and communication needs will be reviewed on a case by case basis.  Special instructions:    Oral Hygiene is also important to reduce your risk of infection.  Remember - BRUSH YOUR TEETH THE MORNING OF SURGERY WITH YOUR REGULAR TOOTHPASTE   - Preparing For Surgery  Before surgery, you can play an important role. Because skin is not sterile, your skin needs to be as free of germs as possible. You can reduce the number of germs on your skin by washing with CHG (chlorahexidine gluconate) Soap before surgery.  CHG is an antiseptic cleaner which kills germs and bonds with the skin to continue killing germs even after washing.     Please do not  use if you have an allergy to CHG or antibacterial soaps. If your skin becomes reddened/irritated stop using the CHG.  Do not shave (including legs and underarms) for at least 48 hours prior to first CHG shower. It is OK to shave your face.  Please follow these instructions carefully.     Shower the NIGHT BEFORE SURGERY and the MORNING OF SURGERY with CHG Soap.   If you chose to wash your hair, wash your hair first as usual with your normal shampoo. After you shampoo, rinse your hair and body thoroughly to remove the shampoo.  Then [employer] and genitals (private parts) with your normal soap and rinse thoroughly to remove soap.  After that Use CHG Soap as you would any other liquid soap. You can apply CHG directly to the skin and wash gently with a scrungie  or a clean washcloth.   Apply the CHG Soap to your body ONLY FROM THE NECK DOWN.  Do not use on open wounds or open sores. Avoid contact with your eyes, ears, mouth and genitals (private parts). Wash Face and genitals (private parts)  with your normal soap.   Wash thoroughly, paying special attention to the area where your surgery will be performed.  Thoroughly rinse your body with warm water from the neck down.  DO NOT shower/wash with your normal soap after using and rinsing off the CHG Soap.  Pat yourself dry with a CLEAN TOWEL.  Wear CLEAN PAJAMAS to bed the night before surgery  Place CLEAN SHEETS on your bed the night before your surgery  DO NOT SLEEP WITH PETS.   Day of Surgery: Take a shower with CHG soap. Wear Clean/Comfortable clothing the morning of surgery Do not apply any deodorants/lotions.   Remember to brush your teeth WITH YOUR REGULAR TOOTHPASTE.   Please read over the following fact sheets that you were given.

## 2021-08-12 ENCOUNTER — Other Ambulatory Visit: Payer: Self-pay

## 2021-08-12 ENCOUNTER — Encounter (HOSPITAL_COMMUNITY)
Admission: RE | Admit: 2021-08-12 | Discharge: 2021-08-12 | Disposition: A | Discharge status: Home / Self Care | Payer: Medicare HMO | Source: Ambulatory Visit | Attending: Surgery | Admitting: Surgery

## 2021-08-12 ENCOUNTER — Encounter (HOSPITAL_COMMUNITY): Payer: Self-pay

## 2021-08-12 VITALS — BP 148/91 | HR 80 | Temp 98.2°F | Resp 18 | Ht 75.0 in | Wt 219.4 lb

## 2021-08-12 DIAGNOSIS — K4090 Unilateral inguinal hernia, without obstruction or gangrene, not specified as recurrent: Secondary | ICD-10-CM | POA: Diagnosis not present

## 2021-08-12 DIAGNOSIS — Z01818 Encounter for other preprocedural examination: Secondary | ICD-10-CM

## 2021-08-12 DIAGNOSIS — R7303 Prediabetes: Secondary | ICD-10-CM | POA: Insufficient documentation

## 2021-08-12 DIAGNOSIS — Z01812 Encounter for preprocedural laboratory examination: Secondary | ICD-10-CM | POA: Diagnosis not present

## 2021-08-12 LAB — CBC WITH DIFFERENTIAL/PLATELET
Abs Immature Granulocytes: 0.04 10*3/uL (ref 0.00–0.07)
Basophils Absolute: 0 10*3/uL (ref 0.0–0.1)
Basophils Relative: 0 %
Eosinophils Absolute: 0.1 10*3/uL (ref 0.0–0.5)
Eosinophils Relative: 1 %
HCT: 51.9 % (ref 39.0–52.0)
Hemoglobin: 17.7 g/dL — ABNORMAL HIGH (ref 13.0–17.0)
Immature Granulocytes: 1 %
Lymphocytes Relative: 30 %
Lymphs Abs: 2.4 10*3/uL (ref 0.7–4.0)
MCH: 33.1 pg (ref 26.0–34.0)
MCHC: 34.1 g/dL (ref 30.0–36.0)
MCV: 97.2 fL (ref 80.0–100.0)
Monocytes Absolute: 0.5 10*3/uL (ref 0.1–1.0)
Monocytes Relative: 7 %
Neutro Abs: 5 10*3/uL (ref 1.7–7.7)
Neutrophils Relative %: 61 %
Platelets: 162 10*3/uL (ref 150–400)
RBC: 5.34 MIL/uL (ref 4.22–5.81)
RDW: 12.7 % (ref 11.5–15.5)
WBC: 8 10*3/uL (ref 4.0–10.5)
nRBC: 0 % (ref 0.0–0.2)

## 2021-08-12 LAB — COMPREHENSIVE METABOLIC PANEL
ALT: 18 U/L (ref 0–44)
AST: 18 U/L (ref 15–41)
Albumin: 4.2 g/dL (ref 3.5–5.0)
Alkaline Phosphatase: 50 U/L (ref 38–126)
Anion gap: 7 (ref 5–15)
BUN: 11 mg/dL (ref 8–23)
CO2: 28 mmol/L (ref 22–32)
Calcium: 9.6 mg/dL (ref 8.9–10.3)
Chloride: 106 mmol/L (ref 98–111)
Creatinine, Ser: 0.87 mg/dL (ref 0.61–1.24)
GFR, Estimated: >60 mL/min (ref >60)
Glucose, Bld: 118 mg/dL — ABNORMAL HIGH (ref 70–99)
Potassium: 4.6 mmol/L (ref 3.5–5.1)
Sodium: 141 mmol/L (ref 135–145)
Total Bilirubin: 1.2 mg/dL (ref 0.3–1.2)
Total Protein: 7.6 g/dL (ref 6.5–8.1)

## 2021-08-12 LAB — HEMOGLOBIN A1C
Hgb A1c MFr Bld: 5.4 % (ref 4.8–5.6)
Mean Plasma Glucose: 108.28 mg/dL

## 2021-08-12 NOTE — Final Progress Note (Signed)
ERQ:[id number] Kyle Norman, MD Cardiologist:Philip Nahser, MD  EKG:09/28/20 CXR: 02/28/21 ECHO: 10/22/20 Stress Test: 10/22/20 Cardiac Cath:denies  Fasting Blood Sugar- does not check glucose.  Patient denies pre-diabetes Checks Blood Sugar_0__ times a day  OSA/CPAP: No  ASA/Blood Thinner: No  Covid test not needed - ambulatory  Anesthesia Review: No.  Per patient, all cardiac studies were normal.    Patient denies shortness of breath, fever, cough, and chest pain at PAT appointment.  Patient verbalized understanding of instructions provided today at the PAT appointment.  Patient asked to review instructions at home and day of surgery.
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
## 2021-08-20 ENCOUNTER — Encounter (HOSPITAL_COMMUNITY): Payer: Self-pay | Admitting: Surgery

## 2021-08-20 ENCOUNTER — Ambulatory Visit (HOSPITAL_COMMUNITY): Payer: Medicare HMO | Admitting: Anesthesiology

## 2021-08-20 ENCOUNTER — Ambulatory Visit (HOSPITAL_COMMUNITY)
Admission: RE | Admit: 2021-08-20 | Discharge: 2021-08-20 | Disposition: A | Discharge status: Home / Self Care | Payer: Medicare HMO | Attending: Surgery | Admitting: Surgery

## 2021-08-20 ENCOUNTER — Encounter (HOSPITAL_COMMUNITY)
Admission: RE | Disposition: A | Discharge status: Home / Self Care | Payer: Self-pay | Source: Home / Self Care | Attending: Surgery

## 2021-08-20 ENCOUNTER — Other Ambulatory Visit: Payer: Self-pay

## 2021-08-20 DIAGNOSIS — K4090 Unilateral inguinal hernia, without obstruction or gangrene, not specified as recurrent: Secondary | ICD-10-CM | POA: Diagnosis not present

## 2021-08-20 DIAGNOSIS — Z9049 Acquired absence of other specified parts of digestive tract: Secondary | ICD-10-CM | POA: Insufficient documentation

## 2021-08-20 DIAGNOSIS — R7303 Prediabetes: Secondary | ICD-10-CM | POA: Diagnosis not present

## 2021-08-20 DIAGNOSIS — I10 Essential (primary) hypertension: Secondary | ICD-10-CM | POA: Diagnosis not present

## 2021-08-20 DIAGNOSIS — K449 Diaphragmatic hernia without obstruction or gangrene: Secondary | ICD-10-CM | POA: Diagnosis not present

## 2021-08-20 DIAGNOSIS — G8918 Other acute postprocedural pain: Secondary | ICD-10-CM | POA: Diagnosis not present

## 2021-08-20 DIAGNOSIS — T8189XA Other complications of procedures, not elsewhere classified, initial encounter: Secondary | ICD-10-CM | POA: Diagnosis not present

## 2021-08-20 HISTORY — PX: INGUINAL HERNIA REPAIR: SHX194

## 2021-08-20 LAB — GLUCOSE, CAPILLARY: Glucose-Capillary: 163 mg/dL — ABNORMAL HIGH (ref 70–99)

## 2021-08-20 SURGERY — REPAIR, HERNIA, INGUINAL, ADULT
Anesthesia: General | Laterality: Left

## 2021-08-20 MED ORDER — SCOPOLAMINE 1 MG/3DAYS TD PT72
MEDICATED_PATCH | TRANSDERMAL | Status: AC
  Filled 2021-08-20: qty 1

## 2021-08-20 MED ORDER — BUPIVACAINE HCL (PF) 0.5 % IJ SOLN
INTRAMUSCULAR | Status: DC | PRN
  Administered 2021-08-20: 20 mL

## 2021-08-20 MED ORDER — PROMETHAZINE HCL 25 MG/ML IJ SOLN: 6.2500 mg | INTRAMUSCULAR | Status: DC | PRN

## 2021-08-20 MED ORDER — FENTANYL CITRATE (PF) 100 MCG/2ML IJ SOLN
25.0000 ug | INTRAMUSCULAR | Status: DC | PRN
  Administered 2021-08-20 (×2): 50 ug via INTRAVENOUS

## 2021-08-20 MED ORDER — LACTATED RINGERS IV SOLN: INTRAVENOUS | Status: DC | PRN

## 2021-08-20 MED ORDER — FENTANYL CITRATE (PF) 100 MCG/2ML IJ SOLN
INTRAMUSCULAR | Status: AC
  Filled 2021-08-20: qty 2

## 2021-08-20 MED ORDER — CHLORHEXIDINE GLUCONATE 0.12 % MT SOLN
15.0000 mL | Freq: Once | OROMUCOSAL | Status: AC
  Administered 2021-08-20: 15 mL via OROMUCOSAL
  Filled 2021-08-20: qty 15

## 2021-08-20 MED ORDER — SODIUM CHLORIDE 0.9 % IR SOLN
Status: DC | PRN
  Administered 2021-08-20: 1000 mL

## 2021-08-20 MED ORDER — CELECOXIB 200 MG PO CAPS
ORAL_CAPSULE | ORAL | Status: AC
  Filled 2021-08-20: qty 1

## 2021-08-20 MED ORDER — FENTANYL CITRATE (PF) 250 MCG/5ML IJ SOLN
INTRAMUSCULAR | Status: AC
  Filled 2021-08-20: qty 5

## 2021-08-20 MED ORDER — TRAMADOL HCL 50 MG PO TABS
ORAL_TABLET | ORAL | Status: AC
  Filled 2021-08-20: qty 1

## 2021-08-20 MED ORDER — CEFAZOLIN SODIUM-DEXTROSE 2-4 GM/100ML-% IV SOLN
2.0000 g | INTRAVENOUS | Status: AC
  Administered 2021-08-20: 2 g via INTRAVENOUS

## 2021-08-20 MED ORDER — FENTANYL CITRATE (PF) 250 MCG/5ML IJ SOLN
INTRAMUSCULAR | Status: DC | PRN
  Administered 2021-08-20 (×3): 50 ug via INTRAVENOUS

## 2021-08-20 MED ORDER — SCOPOLAMINE 1 MG/3DAYS TD PT72
MEDICATED_PATCH | TRANSDERMAL | Status: DC | PRN
  Administered 2021-08-20: 1 via TRANSDERMAL

## 2021-08-20 MED ORDER — CELECOXIB 200 MG PO CAPS
200.0000 mg | ORAL_CAPSULE | Freq: Once | ORAL | Status: AC
  Administered 2021-08-20: 200 mg via ORAL

## 2021-08-20 MED ORDER — ROPIVACAINE HCL 7.5 MG/ML IJ SOLN: INTRAMUSCULAR | Status: DC | PRN

## 2021-08-20 MED ORDER — IBUPROFEN 800 MG PO TABS: 800.0000 mg | ORAL_TABLET | Freq: Three times a day (TID) | ORAL | 0 refills | Status: DC | PRN

## 2021-08-20 MED ORDER — BUPIVACAINE LIPOSOME 1.3 % IJ SUSP
INTRAMUSCULAR | Status: DC | PRN
  Administered 2021-08-20: 10 mL

## 2021-08-20 MED ORDER — TRAMADOL HCL 50 MG PO TABS
50.0000 mg | ORAL_TABLET | Freq: Once | ORAL | Status: AC
  Administered 2021-08-20: 50 mg via ORAL

## 2021-08-20 MED ORDER — ONDANSETRON HCL 4 MG/2ML IJ SOLN
INTRAMUSCULAR | Status: DC | PRN
  Administered 2021-08-20: 4 mg via INTRAVENOUS

## 2021-08-20 MED ORDER — MIDAZOLAM HCL 2 MG/2ML IJ SOLN
INTRAMUSCULAR | Status: DC | PRN
  Administered 2021-08-20: 2 mg via INTRAVENOUS

## 2021-08-20 MED ORDER — ACETAMINOPHEN 500 MG PO TABS
ORAL_TABLET | ORAL | Status: AC
  Filled 2021-08-20: qty 2

## 2021-08-20 MED ORDER — PROPOFOL 10 MG/ML IV BOLUS
INTRAVENOUS | Status: DC | PRN
  Administered 2021-08-20: 150 mg via INTRAVENOUS

## 2021-08-20 MED ORDER — DEXAMETHASONE SODIUM PHOSPHATE 10 MG/ML IJ SOLN
INTRAMUSCULAR | Status: DC | PRN
  Administered 2021-08-20: 10 mg via INTRAVENOUS

## 2021-08-20 MED ORDER — BUPIVACAINE-EPINEPHRINE (PF) 0.25% -1:200000 IJ SOLN
INTRAMUSCULAR | Status: AC
  Filled 2021-08-20: qty 30

## 2021-08-20 MED ORDER — BUPIVACAINE-EPINEPHRINE 0.25% -1:200000 IJ SOLN
INTRAMUSCULAR | Status: DC | PRN
  Administered 2021-08-20: 9 mL

## 2021-08-20 MED ORDER — PROPOFOL 10 MG/ML IV BOLUS
INTRAVENOUS | Status: AC
  Filled 2021-08-20: qty 20

## 2021-08-20 MED ORDER — MIDAZOLAM HCL 2 MG/2ML IJ SOLN
INTRAMUSCULAR | Status: AC
  Filled 2021-08-20: qty 2

## 2021-08-20 MED ORDER — CHLORHEXIDINE GLUCONATE 0.12 % MT SOLN
OROMUCOSAL | Status: AC
  Filled 2021-08-20: qty 15

## 2021-08-20 MED ORDER — TRAMADOL HCL 50 MG PO TABS: 50.0000 mg | ORAL_TABLET | Freq: Four times a day (QID) | ORAL | 0 refills | Status: DC | PRN

## 2021-08-20 MED ORDER — DEXAMETHASONE SODIUM PHOSPHATE 10 MG/ML IJ SOLN
INTRAMUSCULAR | Status: AC
  Filled 2021-08-20: qty 1

## 2021-08-20 MED ORDER — ROCURONIUM BROMIDE 10 MG/ML (PF) SYRINGE
PREFILLED_SYRINGE | INTRAVENOUS | Status: AC
  Filled 2021-08-20: qty 10

## 2021-08-20 MED ORDER — CEFAZOLIN SODIUM-DEXTROSE 2-4 GM/100ML-% IV SOLN
INTRAVENOUS | Status: AC
  Filled 2021-08-20: qty 100

## 2021-08-20 MED ORDER — CHLORHEXIDINE GLUCONATE CLOTH 2 % EX PADS: 6.0000 | MEDICATED_PAD | Freq: Once | CUTANEOUS | Status: DC

## 2021-08-20 MED ORDER — ROCURONIUM BROMIDE 10 MG/ML (PF) SYRINGE
PREFILLED_SYRINGE | INTRAVENOUS | Status: DC | PRN
  Administered 2021-08-20: 90 mg via INTRAVENOUS

## 2021-08-20 MED ORDER — ONDANSETRON HCL 4 MG/2ML IJ SOLN
INTRAMUSCULAR | Status: AC
  Filled 2021-08-20: qty 2

## 2021-08-20 MED ORDER — SUGAMMADEX SODIUM 200 MG/2ML IV SOLN
INTRAVENOUS | Status: DC | PRN
  Administered 2021-08-20: 200 mg via INTRAVENOUS

## 2021-08-20 MED ORDER — AMISULPRIDE (ANTIEMETIC) 5 MG/2ML IV SOLN: 10.0000 mg | Freq: Once | INTRAVENOUS | Status: DC | PRN

## 2021-08-20 MED ORDER — ACETAMINOPHEN 500 MG PO TABS
1000.0000 mg | ORAL_TABLET | Freq: Once | ORAL | Status: AC
  Administered 2021-08-20: 1000 mg via ORAL

## 2021-08-20 SURGICAL SUPPLY — 39 items
BAG COUNTER SPONGE SURGICOUNT (BAG) ×2 IMPLANT
BLADE CLIPPER SURG (BLADE) ×2 IMPLANT
CANISTER SUCT 3000ML PPV (MISCELLANEOUS) ×2 IMPLANT
CHLORAPREP W/TINT 26 (MISCELLANEOUS) ×2 IMPLANT
COVER SURGICAL LIGHT HANDLE (MISCELLANEOUS) ×2 IMPLANT
DERMABOND ADVANCED (GAUZE/BANDAGES/DRESSINGS) ×1
DERMABOND ADVANCED .7 DNX12 (GAUZE/BANDAGES/DRESSINGS) ×1 IMPLANT
DRAIN PENROSE 1/2X12 LTX STRL (WOUND CARE) ×2 IMPLANT
DRAPE LAPAROTOMY TRNSV 102X78 (DRAPES) ×2 IMPLANT
ELECT REM PT RETURN 9FT ADLT (ELECTROSURGICAL) ×2
ELECTRODE REM PT RTRN 9FT ADLT (ELECTROSURGICAL) ×1 IMPLANT
GLOVE SRG 8 PF TXTR STRL LF DI (GLOVE) ×1 IMPLANT
GLOVE SURG ENC MOIS LTX SZ8 (GLOVE) ×2 IMPLANT
GLOVE SURG UNDER POLY LF SZ8 (GLOVE) ×2
GOWN STRL REUS W/ TWL LRG LVL3 (GOWN DISPOSABLE) ×1 IMPLANT
GOWN STRL REUS W/ TWL XL LVL3 (GOWN DISPOSABLE) ×1 IMPLANT
GOWN STRL REUS W/TWL LRG LVL3 (GOWN DISPOSABLE) ×2
GOWN STRL REUS W/TWL XL LVL3 (GOWN DISPOSABLE) ×2
KIT BASIN OR (CUSTOM PROCEDURE TRAY) ×2 IMPLANT
KIT TURNOVER KIT B (KITS) ×2 IMPLANT
MESH HERNIA SYS ULTRAPRO LRG (Mesh General) ×2 IMPLANT
NEEDLE HYPO 25GX1X1/2 BEV (NEEDLE) ×2 IMPLANT
NS IRRIG 1000ML POUR BTL (IV SOLUTION) ×2 IMPLANT
PACK GENERAL/GYN (CUSTOM PROCEDURE TRAY) ×2 IMPLANT
PAD ARMBOARD 7.5X6 YLW CONV (MISCELLANEOUS) ×2 IMPLANT
PENCIL SMOKE EVACUATOR (MISCELLANEOUS) ×2 IMPLANT
SUT MNCRL AB 4-0 PS2 18 (SUTURE) ×4 IMPLANT
SUT NOVA NAB DX-16 0-1 5-0 T12 (SUTURE) ×4 IMPLANT
SUT SILK 2 0 SH (SUTURE) IMPLANT
SUT VIC AB 0 CT1 27 (SUTURE)
SUT VIC AB 0 CT1 27XBRD ANBCTR (SUTURE) IMPLANT
SUT VIC AB 2-0 SH 27 (SUTURE) ×4
SUT VIC AB 2-0 SH 27X BRD (SUTURE) ×1 IMPLANT
SUT VIC AB 2-0 SH 27XBRD (SUTURE) ×1 IMPLANT
SUT VIC AB 3-0 SH 18 (SUTURE) ×2 IMPLANT
SUT VICRYL AB 3 0 TIES (SUTURE) ×2 IMPLANT
SYR CONTROL 10ML LL (SYRINGE) ×2 IMPLANT
TOWEL GREEN STERILE (TOWEL DISPOSABLE) ×2 IMPLANT
TOWEL GREEN STERILE FF (TOWEL DISPOSABLE) ×2 IMPLANT

## 2021-08-20 NOTE — Anesthesia Procedure Notes (Deleted)
Spinal  Patient location during procedure: OR Start time: 08/20/2021 7:02 AM End time: 08/20/2021 7:12 AM Reason for block: surgical anesthesia Staffing Performed: anesthesiologist  Anesthesiologist: Duane Boston, MD Preanesthetic Checklist Completed: patient identified, IV checked, risks and benefits discussed, surgical consent, monitors and equipment checked, pre-op evaluation and timeout performed Spinal Block Patient position: sitting Prep: DuraPrep Patient monitoring: cardiac monitor, continuous pulse ox and blood pressure Approach: midline Location: L2-3 Injection technique: single-shot Needle Needle type: Pencan  Needle gauge: 24 G Needle length: 9 cm Assessment Events: CSF return Additional Notes Functioning IV was confirmed and monitors were applied. Sterile prep and drape, including hand hygiene and sterile gloves were used. The patient was positioned and the spine was prepped. The skin was anesthetized with lidocaine.  Free flow of clear CSF was obtained prior to injecting local anesthetic into the CSF.  The spinal needle aspirated freely following injection.  The needle was carefully withdrawn.  The patient tolerated the procedure well.

## 2021-08-20 NOTE — Discharge Instructions (Signed)
[facility] _______[facility]  UMBILICAL OR INGUINAL HERNIA REPAIR: POST OP INSTRUCTIONS  Always review your discharge instruction sheet given to you by the facility where your surgery was performed. IF YOU HAVE DISABILITY OR FAMILY LEAVE FORMS, YOU MUST BRING THEM TO THE OFFICE FOR PROCESSING.   DO NOT GIVE THEM TO YOUR DOCTOR.  1. A  prescription for pain medication may be given to you upon discharge.  Take your pain medication as prescribed, if needed.  If narcotic pain medicine is not needed, then you may take acetaminophen (Tylenol) or ibuprofen (Advil) as needed. 2. Take your usually prescribed medications unless otherwise directed. If you need a refill on your pain medication, please contact your pharmacy.  They will contact our office to request authorization. Prescriptions will not be filled after 5 pm or on week-ends. 3. You should follow a light diet the first 24 hours after arrival home, such as soup and crackers, etc.  Be sure to include lots of fluids daily.  Resume your normal diet the day after surgery. 4.Most patients will experience some swelling and bruising around the umbilicus or in the groin and scrotum.  Ice packs and reclining will help.  Swelling and bruising can take several days to resolve.  6. It is common to experience some constipation if taking pain medication after surgery.  Increasing fluid intake and taking a stool softener (such as Colace) will usually help or prevent this problem from occurring.  A mild laxative (Milk of Magnesia or Miralax) should be taken according to package directions if there are no bowel movements after 48 hours. 7. Unless discharge instructions indicate otherwise, you may remove your bandages 24-48 hours after surgery, and you may shower at that time.  You may have steri-strips (small skin tapes) in place directly over the incision.  These strips should be left on the skin for 7-10 days.  If your surgeon used skin glue on the  incision, you may shower in 24 hours.  The glue will flake off over the next 2-3 weeks.  Any sutures or staples will be removed at the office during your follow-up visit. 8. ACTIVITIES:  You may resume regular (light) daily activities beginning the next day--such as daily self-care, walking, climbing stairs--gradually increasing activities as tolerated.  You may have sexual intercourse when it is comfortable.  Refrain from any heavy lifting or straining until approved by your doctor.  a.You may drive when you are no longer taking prescription pain medication, you can comfortably wear a seatbelt, and you can safely maneuver your car and apply brakes. b.RETURN TO WORK:   _____________________________________________  9.You should see your doctor in the office for a follow-up appointment approximately 2-3 weeks after your surgery.  Make sure that you call for this appointment within a day or two after you arrive home to insure a convenient appointment time. 10.OTHER INSTRUCTIONS: _________________________    _____________________________________  WHEN TO CALL YOUR DOCTOR: Fever over 101.0 Inability to urinate Nausea and/or vomiting Extreme swelling or bruising Continued bleeding from incision. Increased pain, redness, or drainage from the incision  The clinic staff is available to answer your questions during regular business hours.  Please don't hesitate to call and ask to speak to one of the nurses for clinical concerns.  If you have a medical emergency, go to the nearest emergency room or call 911.  A surgeon from [facility] is always on call at the hospital   [address], [address],   [place], [place]  [address] ?  [address] [address], [place], [place]   [address] [phone] ? [phone] ? FAX [phone] Web site: [email or web address]  
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
## 2021-08-20 NOTE — Interval H&P Note (Signed)
History and Physical Interval Note:  08/20/2021 7:19 AM  Kyle Davidson  has presented today for surgery, with the diagnosis of UMBILICAL PAIN, LEFT LOWER QUADRANT PAIN.  The various methods of treatment have been discussed with the patient and family. After consideration of risks, benefits and other options for treatment, the patient has consented to  Procedure(s): REPAIR LEFT INGUINAL HERNIA WITH MESH, EXCISION OF SUTURE GRANULOMA UMBILICUS (Left) as a surgical intervention.  The patient's history has been reviewed, patient examined, no change in status, stable for surgery.  I have reviewed the patient's chart and labs.  Questions were answered to the patient's satisfaction.     Trail

## 2021-08-20 NOTE — Op Note (Signed)
Preoperative diagnosis: Reducible left inguinal hernia measuring 4 cm indirect and umbilical suture granuloma  Postoperative diagnosis: Same  Procedure: Repair of left inguinal hernia with mesh and excision of umbilical stitch granuloma  Surgeon: Harriette Bouillon, MD  Anesthesia: General with left TAP block per anesthesia and 0.25% Marcaine plain  EBL: Minimal  Specimen: None  Drains: None  Indications for procedure: The patient is a 70-year-old male with a left inguinal hernia and a suture granuloma from previous umbilical hernia repair in the past.  He presents for repair of his left inguinal hernia due to pain and symptoms as well as excision of the stitch granuloma.The risk of hernia repair include bleeding,  Infection,   Recurrence of the hernia,  Mesh use, chronic pain,  Organ injury,  Bowel injury,  Bladder injury,   nerve injury with numbness around the incision,  Death,  and worsening of preexisting  medical problems.  The alternatives to surgery have been discussed as well..  Long term expectations of both operative and non operative treatments have been discussed.   The patient agrees to proceed.    Description of procedure: The patient was met in the holding area.  Chart was reviewed.  The procedure reviewed as well.  Left groin was marked as correct site as well as the umbilicus.  Underwent a block per anesthesia.  All questions were answered.  He was taken back to the operative room.  He is placed supine upon the OR table.  After induction general esthesia, left groin and low lower abdominal region were prepped and draped in sterile fashion timeout performed.  Local anesthetic was infiltrated along the left inguinal crease.  Incision was made in left groin.  Dissection was carried down through Scarpa's fascia to the aponeurosis of the external bleak was identified.  This was opened through the external ring.  Cord structures were identified and he had a significant mount of cord fat.   This was dissected away and there is an indirect defect noted measuring about 4 cm in maximal diameter.  A large piece of ultra pro hernia system was placed with the inner leaflet placed into the preperitoneal space the onlay placed onto the floor of the inguinal canal.  It was then sutured into place using 2-0 Novafil over the pubic tubercle, the shelving edge of the inguinal ligament into the internal oblique.  The ilioinguinal nerve was divided as asked the muscle to prevent trapping.  The mesh was secured around the cord leaving ample room for the cord to exit.  Of note there is a large fatty cord.  There is also evidence of a direct hernia through the floor.  Once the mesh was secured I made sure there is ample space for the cord to exit.  Hemostasis was achieved.  We then closed the external oblique with 2-0 Vicryl.  3-0 Vicryl was used approximate Scarpa's fascia and 4 Monocryl is used to close skin in a subcuticular fashion.  Curvilinear incision was made along the base the umbilicus.  Dissection was carried of the skin.  The stitches causing the granuloma was identified and were excised in their entirety.  I palpated and felt no residual suture material or scar.  We then closed the deep layer of 3 with 3-0 Vicryl and 4-0 Monocryl was used approximate the skin in a subcuticular fashion.  Dermabond was applied.  All counts were found to be correct.  The patient was then awoke extubated taken recovery in satisfactory condition.

## 2021-08-20 NOTE — Transfer of Care (Signed)
Immediate Anesthesia Transfer of Care Note  Patient: Kyle Davidson  Procedure(s) Performed: REPAIR LEFT INGUINAL HERNIA WITH MESH, EXCISION OF SUTURE GRANULOMA UMBILICUS (Left)  Patient Location: PACU  Anesthesia Type:GA combined with regional for post-op pain  Level of Consciousness: drowsy  Airway & Oxygen Therapy: Patient Spontanous Breathing and Patient connected to face mask oxygen  Post-op Assessment: Report given to RN and Post -op Vital signs reviewed and stable  Post vital signs: Reviewed and stable  Last Vitals:  Vitals Value Taken Time  BP 154/91 08/20/21 0914  Temp    Pulse 84 08/20/21 0915  Resp 16 08/20/21 0915  SpO2 99 % 08/20/21 0915  Vitals shown include unvalidated device data.  Last Pain:  Vitals:   08/20/21 0619  TempSrc:   PainSc: 2          Complications: No notable events documented.

## 2021-08-20 NOTE — Anesthesia Procedure Notes (Signed)
Procedure Name: Intubation Date/Time: 08/20/2021 7:32 AM Performed by: Carolan Clines, CRNA Pre-anesthesia Checklist: Patient identified, Emergency Drugs available, Suction available and Patient being monitored Patient Re-evaluated:Patient Re-evaluated prior to induction Oxygen Delivery Method: Circle System Utilized Preoxygenation: Pre-oxygenation with 100% oxygen Induction Type: IV induction Ventilation: Mask ventilation without difficulty Laryngoscope Size: Mac and 4 Grade View: Grade I Tube type: Oral Tube size: 7.5 mm Number of attempts: 1 Airway Equipment and Method: Stylet Placement Confirmation: ETT inserted through vocal cords under direct vision, positive ETCO2 and breath sounds checked- equal and bilateral Secured at: 22 cm Tube secured with: Tape Dental Injury: Teeth and Oropharynx as per pre-operative assessment

## 2021-08-20 NOTE — Anesthesia Postprocedure Evaluation (Signed)
Anesthesia Post Note  Patient: Kyle Davidson  Procedure(s) Performed: REPAIR LEFT INGUINAL HERNIA WITH MESH, EXCISION OF SUTURE GRANULOMA UMBILICUS (Left)     Patient location during evaluation: PACU Anesthesia Type: General Level of consciousness: sedated Pain management: pain level controlled Vital Signs Assessment: post-procedure vital signs reviewed and stable Respiratory status: spontaneous breathing and respiratory function stable Cardiovascular status: stable Postop Assessment: no apparent nausea or vomiting Anesthetic complications: no   No notable events documented.  Last Vitals:  Vitals:   08/20/21 0945 08/20/21 0953  BP: (!) 143/85 (!) 145/83  Pulse: 75 85  Resp: 20 20  Temp: 36.4 C   SpO2: 95% 96%    Last Pain:  Vitals:   08/20/21 0945  TempSrc:   PainSc: 5                  Merrick Feutz DANIEL

## 2021-08-20 NOTE — Anesthesia Procedure Notes (Addendum)
Anesthesia Regional Block: TAP block   Pre-Anesthetic Checklist: , timeout performed,  Correct Patient, Correct Site, Correct Laterality,  Correct Procedure, Correct Position, site marked,  Risks and benefits discussed,  Surgical consent,  Pre-op evaluation,  At surgeon's request and post-op pain management  Laterality: Left  Prep: chloraprep       Needles:  Injection technique: Single-shot  Needle Type: Stimulator Needle - 80     Needle Length: 10cm  Needle Gauge: 21     Additional Needles:   Narrative:  Start time: 08/20/2021 7:02 AM End time: 08/20/2021 7:11 AM Injection made incrementally with aspirations every 5 mL.  Performed by: Personally  Anesthesiologist: Duane Boston, MD

## 2021-08-21 ENCOUNTER — Encounter (HOSPITAL_COMMUNITY): Payer: Self-pay | Admitting: Surgery

## 2021-09-04 DIAGNOSIS — L570 Actinic keratosis: Secondary | ICD-10-CM | POA: Diagnosis not present

## 2021-09-04 DIAGNOSIS — H938X2 Other specified disorders of left ear: Secondary | ICD-10-CM | POA: Diagnosis not present

## 2021-09-04 DIAGNOSIS — L989 Disorder of the skin and subcutaneous tissue, unspecified: Secondary | ICD-10-CM | POA: Diagnosis not present

## 2021-09-06 ENCOUNTER — Other Ambulatory Visit (HOSPITAL_BASED_OUTPATIENT_CLINIC_OR_DEPARTMENT_OTHER): Payer: Self-pay | Admitting: Physician Assistant

## 2021-09-06 DIAGNOSIS — R519 Headache, unspecified: Secondary | ICD-10-CM

## 2021-09-13 ENCOUNTER — Ambulatory Visit (HOSPITAL_BASED_OUTPATIENT_CLINIC_OR_DEPARTMENT_OTHER)
Admission: RE | Admit: 2021-09-13 | Discharge: 2021-09-13 | Disposition: A | Discharge status: Home / Self Care | Payer: Medicare HMO | Source: Ambulatory Visit | Attending: Physician Assistant | Admitting: Physician Assistant

## 2021-09-13 ENCOUNTER — Other Ambulatory Visit: Payer: Self-pay

## 2021-09-13 DIAGNOSIS — S0990XA Unspecified injury of head, initial encounter: Secondary | ICD-10-CM | POA: Diagnosis not present

## 2021-09-13 DIAGNOSIS — R519 Headache, unspecified: Secondary | ICD-10-CM | POA: Diagnosis not present

## 2021-09-24 DIAGNOSIS — Z9889 Other specified postprocedural states: Secondary | ICD-10-CM | POA: Diagnosis not present

## 2021-09-24 DIAGNOSIS — R1312 Dysphagia, oropharyngeal phase: Secondary | ICD-10-CM | POA: Diagnosis not present

## 2021-09-24 DIAGNOSIS — K219 Gastro-esophageal reflux disease without esophagitis: Secondary | ICD-10-CM | POA: Diagnosis not present

## 2021-10-02 ENCOUNTER — Other Ambulatory Visit: Payer: Self-pay | Admitting: Emergency Medicine

## 2021-10-11 DIAGNOSIS — L570 Actinic keratosis: Secondary | ICD-10-CM | POA: Diagnosis not present

## 2021-10-11 DIAGNOSIS — L300 Nummular dermatitis: Secondary | ICD-10-CM | POA: Diagnosis not present

## 2021-10-11 DIAGNOSIS — L821 Other seborrheic keratosis: Secondary | ICD-10-CM | POA: Diagnosis not present

## 2021-10-29 DIAGNOSIS — J020 Streptococcal pharyngitis: Secondary | ICD-10-CM | POA: Diagnosis not present

## 2021-10-29 DIAGNOSIS — J0100 Acute maxillary sinusitis, unspecified: Secondary | ICD-10-CM | POA: Diagnosis not present

## 2021-11-08 ENCOUNTER — Other Ambulatory Visit: Payer: Self-pay | Admitting: Emergency Medicine

## 2021-12-04 DIAGNOSIS — H00024 Hordeolum internum left upper eyelid: Secondary | ICD-10-CM | POA: Diagnosis not present

## 2022-01-02 ENCOUNTER — Other Ambulatory Visit: Payer: Self-pay | Admitting: Student

## 2022-01-02 DIAGNOSIS — G8918 Other acute postprocedural pain: Secondary | ICD-10-CM

## 2022-01-02 DIAGNOSIS — R1030 Lower abdominal pain, unspecified: Secondary | ICD-10-CM

## 2022-01-06 DIAGNOSIS — Z9889 Other specified postprocedural states: Secondary | ICD-10-CM | POA: Diagnosis not present

## 2022-01-06 DIAGNOSIS — Z86010 Personal history of colonic polyps: Secondary | ICD-10-CM | POA: Diagnosis not present

## 2022-01-06 DIAGNOSIS — K219 Gastro-esophageal reflux disease without esophagitis: Secondary | ICD-10-CM | POA: Diagnosis not present

## 2022-01-06 DIAGNOSIS — R1312 Dysphagia, oropharyngeal phase: Secondary | ICD-10-CM | POA: Diagnosis not present

## 2022-01-10 ENCOUNTER — Ambulatory Visit: Payer: Medicare HMO | Admitting: Cardiovascular Disease

## 2022-01-10 ENCOUNTER — Encounter: Payer: Self-pay | Admitting: Cardiovascular Disease

## 2022-01-10 VITALS — BP 142/78 | HR 86 | Ht 75.0 in | Wt 216.6 lb

## 2022-01-10 DIAGNOSIS — Z01812 Encounter for preprocedural laboratory examination: Secondary | ICD-10-CM

## 2022-01-10 DIAGNOSIS — R079 Chest pain, unspecified: Secondary | ICD-10-CM

## 2022-01-10 MED ORDER — METOPROLOL TARTRATE 100 MG PO TABS: 100.0000 mg | ORAL_TABLET | Freq: Once | ORAL | 0 refills | Status: DC

## 2022-01-10 NOTE — Patient Instructions (Signed)
Medication Instructions:  ?Your physician recommends that you continue on your current medications as directed. Please refer to the Current Medication list given to you today. ? ?*If you need a refill on your cardiac medications before your next appointment, please call your pharmacy* ? ?Lab Work: ?TODAY: BMET ?If you have labs (blood work) drawn today and your tests are completely normal, you will receive your results only by: ?MyChart Message (if you have MyChart) OR ?A paper copy in the mail ?If you have any lab test that is abnormal or we need to change your treatment, we will call you to review the results. ? ?Testing/Procedures: ?Your physician has recommended for you to have a coronary CTA. ? ?Follow-Up: ?At [facility], you and your health needs are our priority.  As part of our continuing mission to provide you with exceptional heart care, we have created designated Provider Care Teams.  These Care Teams include your primary Cardiologist (physician) and Advanced Practice Providers (APPs -  Physician Assistants and Nurse Practitioners) who all work together to provide you with the care you need, when you need it. ? ?Your next appointment:   ?6 month(s) ? ?The format for your next appointment:   ?In Person ? ?Provider:   ?Richardson Dopp, PA-C      ? ?Other Instructions ? ? ?Your cardiac CT will be scheduled at the below location:  ? ?[facility] ?[address] ?[place], [place] [address] ?[phone] ? ?At [facility], please arrive at the [facility]'s Entrance (Entrance C2) of [facility] 30 minutes prior to test start time. ?You can use the FREE valet parking offered at entrance C (encouraged to control the heart rate for the test)  ?Proceed to the [facility] Radiology Department (first floor) to check-in and test prep. ? ?All radiology patients and guests should use entrance C2 at [facility], accessed from [facility], even though the hospital's  physical address listed is [address] ? ? ? ?Please follow these instructions carefully (unless otherwise directed): ? ?Hold all erectile dysfunction medications at least 3 days (72 hrs) prior to test. ? ?On the Night Before the Test: ?Be sure to Drink plenty of water. ?Do not consume any caffeinated/decaffeinated beverages or chocolate 12 hours prior to your test. ?Do not take any antihistamines 12 hours prior to your test. ? ?On the Day of the Test: ?Drink plenty of water until 1 hour prior to the test. ?Do not eat any food 4 hours prior to the test. ?You may take your regular medications prior to the test.  ?Take metoprolol (Lopressor) '100mg'$  two hours prior to test. ?     ?After the Test: ?Drink plenty of water. ?After receiving IV contrast, you may experience a mild flushed feeling. This is normal. ?On occasion, you may experience a mild rash up to 24 hours after the test. This is not dangerous. If this occurs, you can take Benadryl 25 mg and increase your fluid intake. ?If you experience trouble breathing, this can be serious. If it is severe call 911 IMMEDIATELY. If it is mild, please call our office. ?If you take any of these medications: Glipizide/Metformin, Avandament, Glucavance, please do not take 48 hours after completing test unless otherwise instructed. ? ?We will call to schedule your test 2-4 weeks out understanding that some insurance companies will need an authorization prior to the service being performed.  ? ?For non-scheduling related questions, please contact the cardiac imaging  nurse navigator should you have any questions/concerns: ?Marchia Bond, Cardiac Imaging Nurse Navigator ?Gordy Clement, Cardiac Imaging Nurse Navigator ?[facility] ?Direct Office Dial: [phone]  ? ?For scheduling needs, including cancellations and rescheduling, please call [place], [phone].  ? ?Important Information About Sugar ? ? ? ? ?  ?
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
## 2022-01-14 DIAGNOSIS — D485 Neoplasm of uncertain behavior of skin: Secondary | ICD-10-CM | POA: Diagnosis not present

## 2022-01-14 DIAGNOSIS — L570 Actinic keratosis: Secondary | ICD-10-CM | POA: Diagnosis not present

## 2022-01-14 DIAGNOSIS — L300 Nummular dermatitis: Secondary | ICD-10-CM | POA: Diagnosis not present

## 2022-01-15 LAB — BASIC METABOLIC PANEL
BUN/Creatinine Ratio: 11 (ref 10–24)
BUN: 9 mg/dL (ref 8–27)
CO2: 25 mmol/L (ref 20–29)
Calcium: 9.3 mg/dL (ref 8.6–10.2)
Chloride: 105 mmol/L (ref 96–106)
Creatinine, Ser: 0.85 mg/dL (ref 0.76–1.27)
Glucose: 108 mg/dL — ABNORMAL HIGH (ref 70–99)
Potassium: 4.5 mmol/L (ref 3.5–5.2)
Sodium: 143 mmol/L (ref 134–144)
eGFR: 93 mL/min/{1.73_m2} (ref >59)

## 2022-01-16 ENCOUNTER — Ambulatory Visit
Admission: RE | Admit: 2022-01-16 | Discharge: 2022-01-16 | Disposition: A | Discharge status: Home / Self Care | Payer: Medicare HMO | Source: Ambulatory Visit | Attending: Student | Admitting: Student

## 2022-01-16 DIAGNOSIS — N281 Cyst of kidney, acquired: Secondary | ICD-10-CM | POA: Diagnosis not present

## 2022-01-16 DIAGNOSIS — R1030 Lower abdominal pain, unspecified: Secondary | ICD-10-CM

## 2022-01-16 DIAGNOSIS — G8918 Other acute postprocedural pain: Secondary | ICD-10-CM

## 2022-01-16 DIAGNOSIS — I700 Atherosclerosis of aorta: Secondary | ICD-10-CM | POA: Diagnosis not present

## 2022-01-16 DIAGNOSIS — Z9049 Acquired absence of other specified parts of digestive tract: Secondary | ICD-10-CM | POA: Diagnosis not present

## 2022-01-16 MED ORDER — IOPAMIDOL (ISOVUE-300) INJECTION 61%
100.0000 mL | Freq: Once | INTRAVENOUS | Status: AC | PRN
  Administered 2022-01-16: 100 mL via INTRAVENOUS

## 2022-01-30 ENCOUNTER — Telehealth (HOSPITAL_COMMUNITY): Payer: Self-pay | Admitting: *Deleted

## 2022-01-30 NOTE — Telephone Encounter (Signed)
Reaching out to patient to offer assistance regarding upcoming cardiac imaging study; pt verbalizes understanding of appt date/time, parking situation and where to check in, pre-test NPO status and medications ordered, and verified current allergies; name and call back number provided for further questions should they arise  Riaan Toledo RN Navigator Cardiac Imaging [facility] [phone] office [phone] cell  Patient to take 100mg metoprolol tartrate two hours prior to his cardiac CT scan. He is aware to arrive at 9:30am. 

## 2022-01-31 ENCOUNTER — Other Ambulatory Visit (HOSPITAL_COMMUNITY): Payer: Self-pay | Admitting: *Deleted

## 2022-01-31 DIAGNOSIS — I77810 Thoracic aortic ectasia: Secondary | ICD-10-CM

## 2022-02-04 ENCOUNTER — Telehealth: Payer: Self-pay

## 2022-02-04 ENCOUNTER — Ambulatory Visit (HOSPITAL_COMMUNITY)
Admission: RE | Admit: 2022-02-04 | Discharge: 2022-02-04 | Disposition: A | Discharge status: Home / Self Care | Payer: Medicare HMO | Source: Ambulatory Visit | Attending: Cardiology | Admitting: Cardiology

## 2022-02-04 ENCOUNTER — Ambulatory Visit (HOSPITAL_COMMUNITY)
Admission: RE | Admit: 2022-02-04 | Discharge: 2022-02-04 | Disposition: A | Discharge status: Home / Self Care | Payer: Medicare HMO | Source: Ambulatory Visit | Attending: Cardiovascular Disease | Admitting: Cardiovascular Disease

## 2022-02-04 ENCOUNTER — Other Ambulatory Visit: Payer: Self-pay | Admitting: Cardiology

## 2022-02-04 ENCOUNTER — Encounter (HOSPITAL_COMMUNITY): Payer: Self-pay

## 2022-02-04 ENCOUNTER — Ambulatory Visit (HOSPITAL_BASED_OUTPATIENT_CLINIC_OR_DEPARTMENT_OTHER)
Admission: RE | Admit: 2022-02-04 | Discharge: 2022-02-04 | Disposition: A | Discharge status: Home / Self Care | Payer: Medicare HMO | Source: Ambulatory Visit | Attending: Cardiology | Admitting: Cardiology

## 2022-02-04 DIAGNOSIS — I2510 Atherosclerotic heart disease of native coronary artery without angina pectoris: Secondary | ICD-10-CM | POA: Diagnosis not present

## 2022-02-04 DIAGNOSIS — R931 Abnormal findings on diagnostic imaging of heart and coronary circulation: Secondary | ICD-10-CM

## 2022-02-04 DIAGNOSIS — I77810 Thoracic aortic ectasia: Secondary | ICD-10-CM | POA: Insufficient documentation

## 2022-02-04 DIAGNOSIS — I700 Atherosclerosis of aorta: Secondary | ICD-10-CM | POA: Insufficient documentation

## 2022-02-04 DIAGNOSIS — R079 Chest pain, unspecified: Secondary | ICD-10-CM | POA: Diagnosis present

## 2022-02-04 MED ORDER — NITROGLYCERIN 0.4 MG SL SUBL
0.8000 mg | SUBLINGUAL_TABLET | Freq: Once | SUBLINGUAL | Status: AC
  Administered 2022-02-04: 0.8 mg via SUBLINGUAL

## 2022-02-04 MED ORDER — ASPIRIN EC 81 MG PO TBEC: 81.0000 mg | DELAYED_RELEASE_TABLET | Freq: Every day | ORAL | 3 refills | Status: DC

## 2022-02-04 MED ORDER — IOHEXOL 350 MG/ML SOLN
100.0000 mL | Freq: Once | INTRAVENOUS | Status: AC | PRN
  Administered 2022-02-04: 100 mL via INTRAVENOUS

## 2022-02-04 MED ORDER — NITROGLYCERIN 0.4 MG SL SUBL
SUBLINGUAL_TABLET | SUBLINGUAL | Status: AC
  Filled 2022-02-04: qty 2

## 2022-02-04 NOTE — Telephone Encounter (Signed)
Order for aspirin sent in at this time. ?

## 2022-02-04 NOTE — Telephone Encounter (Signed)
-----   Message from Michaelyn Barter, RN sent at 02/04/2022  4:30 PM EDT ----- ? ?----- Message ----- ?From: Fay Records, MD ?Sent: 02/04/2022   3:27 PM EDT ?To: Cv Div [facility] Triage ? ?Contacted patient   He had already been notified of test resutls   Has appt on Friday with K Lawrence to discuss heart catheterization ?Recomm 81 mg ecASA   ? ?

## 2022-02-04 NOTE — Telephone Encounter (Signed)
Called and spoke with patient regarding Coronary CTA results.  ? ?Per Dr. Stanford Breed- patient needs appointment this week to discuss LHC.  ? ?Scheduled patient an appointment to see Jory Sims on this Friday 5/19 at 2:45 to  discuss.  ? ?Patient aware of all instructions and verbalized understanding  ? ?Patient of Dr. Acie Fredrickson. ? ? ?
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
## 2022-02-07 ENCOUNTER — Ambulatory Visit: Payer: Medicare HMO | Admitting: Adult Health

## 2022-02-07 ENCOUNTER — Other Ambulatory Visit: Payer: Self-pay | Admitting: Adult Health

## 2022-02-07 ENCOUNTER — Encounter: Payer: Self-pay | Admitting: Adult Health

## 2022-02-07 VITALS — BP 134/96 | HR 80 | Ht 75.0 in | Wt 217.0 lb

## 2022-02-07 DIAGNOSIS — E7800 Pure hypercholesterolemia, unspecified: Secondary | ICD-10-CM | POA: Diagnosis not present

## 2022-02-07 DIAGNOSIS — I2510 Atherosclerotic heart disease of native coronary artery without angina pectoris: Secondary | ICD-10-CM | POA: Diagnosis not present

## 2022-02-07 DIAGNOSIS — F418 Other specified anxiety disorders: Secondary | ICD-10-CM | POA: Diagnosis not present

## 2022-02-07 MED ORDER — NITROGLYCERIN 0.4 MG SL SUBL: 0.4000 mg | SUBLINGUAL_TABLET | SUBLINGUAL | 3 refills | Status: DC | PRN

## 2022-02-07 NOTE — Patient Instructions (Signed)
Medication Instructions:  No Changes *If you need a refill on your cardiac medications before your next appointment, please call your pharmacy*   Lab Work: CBC, BMET If you have labs (blood work) drawn today and your tests are completely normal, you will receive your results only by: [facility] (if you have MyChart) OR A paper copy in the mail If you have any lab test that is abnormal or we need to change your treatment, we will call you to review the results.   Testing/Procedures:  [facility] [address] [place] [address] Dept: [phone] Loc: [place]  02/07/2022  You are scheduled for a Cardiac Catheterization on Tuesday, May 23 with Dr. [name].  1. Please arrive at the Main Entrance A at [facility]: [place], [place] [id number] at 7:00 AM (This time is two hours before your procedure to ensure your preparation). Free valet parking service is available.   Special note: Every effort is made to have your procedure done on time. Please understand that emergencies sometimes delay scheduled procedures.  2. Diet: Do not eat solid foods after midnight.  You may have clear liquids until 5 AM upon the day of the procedure.  3. Labs: You will need to have blood drawn on Friday, May 19 at [facility]  Open: 8am - 5pm (Lunch 12:30 - 1:30)   Phone: [phone]. You do not need to be fasting.  4. Medication instructions in preparation for your procedure:   Contrast Allergy: No     Current Outpatient Medications (Respiratory):    fluticasone (FLONASE) 50 MCG/ACT nasal spray, SPRAY 2 SPRAYS INTO EACH NOSTRIL EVERY DAY   guaiFENesin (MUCINEX) 600 MG 12 hr tablet, Take 600 mg by mouth 2 (two) times daily as needed for cough or to loosen phlegm.  Current Outpatient Medications (Analgesics):     acetaminophen (TYLENOL) 500 MG tablet, Take 500-1,000 mg by mouth every 6 (six) hours as needed for moderate pain.   aspirin EC 81 MG tablet, Take 1 tablet (81 mg total) by mouth daily. Swallow whole.   Current Outpatient Medications (Other):    clotrimazole-betamethasone (LOTRISONE) cream, Apply 1 application. topically daily.   hydrocortisone cream 1 %, Apply 1 application. topically in the morning.   pantoprazole (PROTONIX) 40 MG tablet, Take 40 mg by mouth 2 (two) times daily. *For reference purposes while preparing patient instructions.   Delete this med list prior to printing instructions for patient.*          On the morning of your procedure, take Aspirin and any morning medicines NOT listed above.  You may use sips of water.  5. Plan to go home the same day, you will only stay overnight if medically necessary. 6. You MUST have a responsible adult to drive you home. 7. An adult MUST be with you the first 24 hours after you arrive home. 8. Bring a current list of your medications, and the last time and date medication taken. 9. Bring ID and current insurance cards. 10.Please wear clothes that are easy to get on and off and wear slip-on shoes.  Thank you for allowing [place] to care for you!   -- [facility]    Follow-Up: At [facility], you and your health needs are our priority.  As part of our continuing mission to provide you with exceptional heart care,  we have created designated Provider Care Teams.  These Care Teams include your primary Cardiologist (physician) and Advanced Practice Providers (APPs -  Physician Assistants and Nurse Practitioners) who all work together to provide you with the care you need, when you need it.  We recommend signing up for the patient portal called "MyChart".  Sign up information is provided on this After Visit Summary.  MyChart is used to connect with patients for Virtual Visits (Telemedicine).  Patients are  able to view lab/test results, encounter notes, upcoming appointments, etc.  Non-urgent messages can be sent to your provider as well.   To learn more about what you can do with MyChart, go to [email or web address].    Your next appointment:   2 week(s) Post Catherization  The format for your next appointment:   In Person  Provider:   Mertie Moores, MD       Important Information About Sugar

## 2022-02-08 LAB — CBC
Hematocrit: 50.6 % (ref 37.5–51.0)
Hemoglobin: 17.7 g/dL (ref 13.0–17.7)
MCH: 33 pg (ref 26.6–33.0)
MCHC: 35 g/dL (ref 31.5–35.7)
MCV: 94 fL (ref 79–97)
Platelets: 106 10*3/uL — ABNORMAL LOW (ref 150–450)
RBC: 5.36 x10E6/uL (ref 4.14–5.80)
RDW: 13.3 % (ref 11.6–15.4)
WBC: 5.7 10*3/uL (ref 3.4–10.8)

## 2022-02-08 LAB — BASIC METABOLIC PANEL
BUN/Creatinine Ratio: 11 (ref 10–24)
BUN: 8 mg/dL (ref 8–27)
CO2: 17 mmol/L — ABNORMAL LOW (ref 20–29)
Calcium: 9 mg/dL (ref 8.6–10.2)
Chloride: 105 mmol/L (ref 96–106)
Creatinine, Ser: 0.7 mg/dL — ABNORMAL LOW (ref 0.76–1.27)
Glucose: 105 mg/dL — ABNORMAL HIGH (ref 70–99)
Potassium: 4.2 mmol/L (ref 3.5–5.2)
Sodium: 142 mmol/L (ref 134–144)
eGFR: 99 mL/min/{1.73_m2} (ref >59)

## 2022-02-10 ENCOUNTER — Telehealth: Payer: Self-pay | Admitting: *Deleted

## 2022-02-10 NOTE — Telephone Encounter (Signed)
Cardiac Catheterization scheduled at [facility] for: Tuesday Feb 11, 2022 9 AM Arrival time and place: [facility] Main Entrance A at: 7 AM   Nothing to eat after midnight prior to procedure, clear liquids until 5 AM day of procedure.  Medication instructions: -Usual morning medications can be taken with sips of water including aspirin 81 mg.  Confirmed patient has responsible adult to drive home post procedure and be with patient first 24 hours after arriving home.  Patient reports no new symptoms concerning for COVID-19/no exposure to COVID-19 in the past 10 days.  Reviewed procedure instructions with patient.

## 2022-02-11 ENCOUNTER — Ambulatory Visit (HOSPITAL_BASED_OUTPATIENT_CLINIC_OR_DEPARTMENT_OTHER): Payer: Medicare HMO

## 2022-02-11 ENCOUNTER — Encounter (HOSPITAL_COMMUNITY)
Admission: RE | Disposition: A | Discharge status: Home / Self Care | Payer: Self-pay | Source: Home / Self Care | Attending: Cardiovascular Disease

## 2022-02-11 ENCOUNTER — Ambulatory Visit (HOSPITAL_COMMUNITY)
Admission: RE | Admit: 2022-02-11 | Discharge: 2022-02-11 | Disposition: A | Discharge status: Home / Self Care | Payer: Medicare HMO | Attending: Cardiovascular Disease | Admitting: Cardiovascular Disease

## 2022-02-11 ENCOUNTER — Other Ambulatory Visit: Payer: Self-pay

## 2022-02-11 DIAGNOSIS — I25119 Atherosclerotic heart disease of native coronary artery with unspecified angina pectoris: Secondary | ICD-10-CM | POA: Diagnosis not present

## 2022-02-11 DIAGNOSIS — I2510 Atherosclerotic heart disease of native coronary artery without angina pectoris: Secondary | ICD-10-CM | POA: Diagnosis not present

## 2022-02-11 DIAGNOSIS — R931 Abnormal findings on diagnostic imaging of heart and coronary circulation: Secondary | ICD-10-CM

## 2022-02-11 DIAGNOSIS — I77819 Aortic ectasia, unspecified site: Secondary | ICD-10-CM | POA: Diagnosis not present

## 2022-02-11 DIAGNOSIS — N400 Enlarged prostate without lower urinary tract symptoms: Secondary | ICD-10-CM | POA: Diagnosis not present

## 2022-02-11 DIAGNOSIS — R7303 Prediabetes: Secondary | ICD-10-CM | POA: Diagnosis not present

## 2022-02-11 DIAGNOSIS — K219 Gastro-esophageal reflux disease without esophagitis: Secondary | ICD-10-CM | POA: Diagnosis not present

## 2022-02-11 DIAGNOSIS — F419 Anxiety disorder, unspecified: Secondary | ICD-10-CM | POA: Insufficient documentation

## 2022-02-11 DIAGNOSIS — I209 Angina pectoris, unspecified: Secondary | ICD-10-CM | POA: Diagnosis present

## 2022-02-11 DIAGNOSIS — Z8673 Personal history of transient ischemic attack (TIA), and cerebral infarction without residual deficits: Secondary | ICD-10-CM | POA: Diagnosis not present

## 2022-02-11 DIAGNOSIS — E7800 Pure hypercholesterolemia, unspecified: Secondary | ICD-10-CM | POA: Diagnosis not present

## 2022-02-11 HISTORY — PX: LEFT HEART CATH AND CORONARY ANGIOGRAPHY: CATH118249

## 2022-02-11 LAB — ECHOCARDIOGRAM COMPLETE
Area-P 1/2: 3.53 cm2
Calc EF: 65.3 %
Height: 75 in
S' Lateral: 2.8 cm
Single Plane A2C EF: 65.1 %
Single Plane A4C EF: 62.3 %
Weight: 3472 oz

## 2022-02-11 SURGERY — LEFT HEART CATH AND CORONARY ANGIOGRAPHY
Anesthesia: LOCAL

## 2022-02-11 MED ORDER — AMLODIPINE BESYLATE 5 MG PO TABS
5.0000 mg | ORAL_TABLET | Freq: Every day | ORAL | Status: DC
  Administered 2022-02-11: 5 mg via ORAL
  Filled 2022-02-11: qty 1

## 2022-02-11 MED ORDER — ASPIRIN 81 MG PO CHEW: 81.0000 mg | CHEWABLE_TABLET | ORAL | Status: DC

## 2022-02-11 MED ORDER — SODIUM CHLORIDE 0.9% FLUSH: 3.0000 mL | Freq: Two times a day (BID) | INTRAVENOUS | Status: DC

## 2022-02-11 MED ORDER — SODIUM CHLORIDE 0.9% FLUSH
3.0000 mL | Freq: Two times a day (BID) | INTRAVENOUS | Status: DC
Start: 2022-02-11 — End: 2022-02-11

## 2022-02-11 MED ORDER — HEPARIN (PORCINE) IN NACL 1000-0.9 UT/500ML-% IV SOLN
INTRAVENOUS | Status: AC
  Filled 2022-02-11: qty 1000

## 2022-02-11 MED ORDER — IOHEXOL 350 MG/ML SOLN
INTRAVENOUS | Status: DC | PRN
  Administered 2022-02-11: 80 mL

## 2022-02-11 MED ORDER — SODIUM CHLORIDE 0.9% FLUSH: 3.0000 mL | INTRAVENOUS | Status: DC | PRN

## 2022-02-11 MED ORDER — FENTANYL CITRATE (PF) 100 MCG/2ML IJ SOLN
INTRAMUSCULAR | Status: DC | PRN
  Administered 2022-02-11: 25 ug via INTRAVENOUS

## 2022-02-11 MED ORDER — FENTANYL CITRATE (PF) 100 MCG/2ML IJ SOLN
INTRAMUSCULAR | Status: AC
  Filled 2022-02-11: qty 2

## 2022-02-11 MED ORDER — LIDOCAINE HCL (PF) 1 % IJ SOLN
INTRAMUSCULAR | Status: AC
  Filled 2022-02-11: qty 30

## 2022-02-11 MED ORDER — HEPARIN (PORCINE) IN NACL 2-0.9 UNITS/ML
INTRAMUSCULAR | Status: DC | PRN
  Administered 2022-02-11: 10 mL via INTRA_ARTERIAL

## 2022-02-11 MED ORDER — ATORVASTATIN CALCIUM 80 MG PO TABS: 80.0000 mg | ORAL_TABLET | Freq: Every day | ORAL | 11 refills | Status: DC

## 2022-02-11 MED ORDER — HEPARIN (PORCINE) IN NACL 1000-0.9 UT/500ML-% IV SOLN
INTRAVENOUS | Status: DC | PRN
  Administered 2022-02-11 (×2): 500 mL

## 2022-02-11 MED ORDER — METOPROLOL TARTRATE 12.5 MG HALF TABLET
12.5000 mg | ORAL_TABLET | Freq: Two times a day (BID) | ORAL | Status: DC
  Administered 2022-02-11: 12.5 mg via ORAL
  Filled 2022-02-11: qty 1

## 2022-02-11 MED ORDER — SODIUM CHLORIDE 0.9 % WEIGHT BASED INFUSION
3.0000 mL/kg/h | INTRAVENOUS | Status: AC
  Administered 2022-02-11: 3 mL/kg/h via INTRAVENOUS

## 2022-02-11 MED ORDER — SODIUM CHLORIDE 0.9 % IV SOLN: 250.0000 mL | INTRAVENOUS | Status: DC | PRN

## 2022-02-11 MED ORDER — ONDANSETRON HCL 4 MG/2ML IJ SOLN: 4.0000 mg | Freq: Four times a day (QID) | INTRAMUSCULAR | Status: DC | PRN

## 2022-02-11 MED ORDER — METOPROLOL TARTRATE 25 MG PO TABS
25.0000 mg | ORAL_TABLET | Freq: Two times a day (BID) | ORAL | 11 refills | Status: DC
Start: 2022-02-11 — End: 2022-03-01

## 2022-02-11 MED ORDER — HYDRALAZINE HCL 20 MG/ML IJ SOLN: 10.0000 mg | INTRAMUSCULAR | Status: DC | PRN

## 2022-02-11 MED ORDER — PERFLUTREN LIPID MICROSPHERE
1.0000 mL | INTRAVENOUS | Status: DC | PRN
  Administered 2022-02-11: 3 mL via INTRAVENOUS

## 2022-02-11 MED ORDER — HEPARIN SODIUM (PORCINE) 1000 UNIT/ML IJ SOLN
INTRAMUSCULAR | Status: AC
  Filled 2022-02-11: qty 10

## 2022-02-11 MED ORDER — ASPIRIN 81 MG PO CHEW: 81.0000 mg | CHEWABLE_TABLET | Freq: Every day | ORAL | Status: DC

## 2022-02-11 MED ORDER — VERAPAMIL HCL 2.5 MG/ML IV SOLN
INTRAVENOUS | Status: AC
  Filled 2022-02-11: qty 2

## 2022-02-11 MED ORDER — ATORVASTATIN CALCIUM 80 MG PO TABS
80.0000 mg | ORAL_TABLET | Freq: Every day | ORAL | Status: DC
  Administered 2022-02-11: 80 mg via ORAL
  Filled 2022-02-11 (×2): qty 1

## 2022-02-11 MED ORDER — AMLODIPINE BESYLATE 5 MG PO TABS: 5.0000 mg | ORAL_TABLET | Freq: Every day | ORAL | 11 refills | Status: DC

## 2022-02-11 MED ORDER — MIDAZOLAM HCL 2 MG/2ML IJ SOLN
INTRAMUSCULAR | Status: DC | PRN
  Administered 2022-02-11: 2 mg via INTRAVENOUS

## 2022-02-11 MED ORDER — ATORVASTATIN CALCIUM 80 MG PO TABS
80.0000 mg | ORAL_TABLET | Freq: Every day | ORAL | Status: DC
Start: 2022-02-11 — End: 2022-02-11

## 2022-02-11 MED ORDER — MIDAZOLAM HCL 2 MG/2ML IJ SOLN
INTRAMUSCULAR | Status: AC
  Filled 2022-02-11: qty 2

## 2022-02-11 MED ORDER — LIDOCAINE HCL (PF) 1 % IJ SOLN
INTRAMUSCULAR | Status: DC | PRN
  Administered 2022-02-11: 2 mL

## 2022-02-11 MED ORDER — SODIUM CHLORIDE 0.9 % IV SOLN: INTRAVENOUS | Status: DC

## 2022-02-11 MED ORDER — SODIUM CHLORIDE 0.9 % WEIGHT BASED INFUSION: 1.0000 mL/kg/h | INTRAVENOUS | Status: DC

## 2022-02-11 MED ORDER — HEPARIN SODIUM (PORCINE) 1000 UNIT/ML IJ SOLN
INTRAMUSCULAR | Status: DC | PRN
  Administered 2022-02-11: 5000 [IU] via INTRAVENOUS

## 2022-02-11 MED ORDER — LABETALOL HCL 5 MG/ML IV SOLN: 10.0000 mg | INTRAVENOUS | Status: DC | PRN

## 2022-02-11 MED ORDER — ACETAMINOPHEN 325 MG PO TABS: 650.0000 mg | ORAL_TABLET | ORAL | Status: DC | PRN

## 2022-02-11 SURGICAL SUPPLY — 11 items
BAND ZEPHYR COMPRESS 30 LONG (HEMOSTASIS) ×1 IMPLANT
CATH INFINITI JR4 5F (CATHETERS) ×1 IMPLANT
CATH OPTITORQUE TIG 4.0 5F (CATHETERS) ×1 IMPLANT
GLIDESHEATH SLEND SS 6F .021 (SHEATH) ×1 IMPLANT
GUIDEWIRE INQWIRE 1.5J.035X260 (WIRE) IMPLANT
INQWIRE 1.5J .035X260CM (WIRE) ×2
KIT HEART LEFT (KITS) ×2 IMPLANT
PACK CARDIAC CATHETERIZATION (CUSTOM PROCEDURE TRAY) ×2 IMPLANT
SHEATH PROBE COVER 6X72 (BAG) ×1 IMPLANT
TRANSDUCER W/STOPCOCK (MISCELLANEOUS) ×2 IMPLANT
TUBING CIL FLEX 10 FLL-RA (TUBING) ×2 IMPLANT

## 2022-02-11 NOTE — Progress Notes (Incomplete)
  Echocardiogram 2D Echocardiogram has been performed.  Kyle Davidson 02/11/2022, 12:12 PM

## 2022-02-11 NOTE — Interval H&P Note (Signed)
Cath Lab Visit (complete for each Cath Lab visit)  Clinical Evaluation Leading to the Procedure:   ACS: No.  Non-ACS:    Anginal Classification: CCS III  Anti-ischemic medical therapy: No Therapy  Non-Invasive Test Results: Intermediate-risk stress test findings: cardiac mortality 1-3%/year  Prior CABG: No previous CABG      History and Physical Interval Note:  02/11/2022 10:12 AM  [name]  has presented today for surgery, with the diagnosis of coronary artery disease.  The various methods of treatment have been discussed with the patient and family. After consideration of risks, benefits and other options for treatment, the patient has consented to  Procedure(s): LEFT HEART CATH AND CORONARY ANGIOGRAPHY (N/A) as a surgical intervention.  The patient's history has been reviewed, patient examined, no change in status, stable for surgery.  I have reviewed the patient's chart and labs.  Questions were answered to the patient's satisfaction.     [name]

## 2022-02-12 ENCOUNTER — Encounter (HOSPITAL_COMMUNITY): Payer: Self-pay | Admitting: Cardiovascular Disease

## 2022-02-13 ENCOUNTER — Telehealth: Payer: Self-pay
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
## 2022-02-13 NOTE — Telephone Encounter (Addendum)
Called patient regarding results. Left message results viewed on My Chart by patient.----- Message from Lendon Colonel, NP sent at 02/08/2022 12:38 PM EDT ----- I have reviewed labs. Kidney function is okay. His platelets are low compared to prior labs. Guidelines, do not advise against having catheterization , despite having low platelet level. 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   KL

## 2022-02-14 ENCOUNTER — Other Ambulatory Visit: Payer: Self-pay | Admitting: *Deleted

## 2022-02-14 ENCOUNTER — Encounter: Payer: Self-pay | Admitting: *Deleted

## 2022-02-14 ENCOUNTER — Institutional Professional Consult (permissible substitution): Payer: Medicare HMO | Admitting: Thoracic Surgery (Cardiothoracic Vascular Surgery)

## 2022-02-14 VITALS — BP 145/88 | HR 75 | Resp 20 | Ht 75.0 in | Wt 217.0 lb

## 2022-02-14 DIAGNOSIS — I2510 Atherosclerotic heart disease of native coronary artery without angina pectoris: Secondary | ICD-10-CM | POA: Diagnosis not present

## 2022-02-20 NOTE — Progress Notes (Signed)
Surgical Instructions    Your procedure is scheduled on Tuesday, June 6th.  Report to [facility] Main Entrance "A" at 5:30 A.M., then check in with the Admitting office.  Call this number if you have problems the morning of surgery:  [phone]   If you have any questions prior to your surgery date call [phone]: Open Monday-Friday 8am-4pm    Remember:  Do not eat or drink after midnight the night before your surgery     Take these medicines the morning of surgery with A SIP OF WATER:   Amlodipine (Norvasc)  Atorvastatin (Lipitor)  Metoprolol (Lopressor)  Pantoprazole (Protonix)   If needed:  Flonase Nasal Spray  As of today, STOP taking any Aspirin (unless otherwise instructed by your surgeon) Aleve, Naproxen, Ibuprofen, Motrin, Advil, Goody's, BC's, all herbal medications, fish oil, and all vitamins.           DAY OF SURGERY: Do not wear jewelry Do not wear lotions, powders, colognes, or deodorant. Men may shave face and neck. Do not bring valuables to the hospital.  [facility] is not responsible for any belongings or valuables. .   Do NOT Smoke (Tobacco/Vaping)  24 hours prior to your procedure  If you use a CPAP at night, you may bring your mask for your overnight stay.   Contacts, glasses, hearing aids, dentures or partials may not be worn into surgery, please bring cases for these belongings   For patients admitted to the hospital, discharge time will be determined by your treatment team.   Patients discharged the day of surgery will not be allowed to drive home, and someone needs to stay with them for 24 hours.   SURGICAL WAITING ROOM VISITATION Patients having surgery or a procedure in a hospital may have two support people. Children under the age of 53 must have an adult with them who is not the patient. They may stay in the waiting area during the procedure and may switch out with other visitors. If the patient needs to stay at the hospital during part  of their recovery, the visitor guidelines for inpatient rooms apply.  Please refer to the [facility] website for the visitor guidelines for Inpatients (after your surgery is over and you are in a regular room).    Special instructions:    Oral Hygiene is also important to reduce your risk of infection.  Remember - BRUSH YOUR TEETH THE MORNING OF SURGERY WITH YOUR REGULAR TOOTHPASTE   [place]- Preparing For Surgery  Before surgery, you can play an important role. Because skin is not sterile, your skin needs to be as free of germs as possible. You can reduce the number of germs on your skin by washing with CHG (chlorahexidine gluconate) Soap before surgery.  CHG is an antiseptic cleaner which kills germs and bonds with the skin to continue killing germs even after washing.     Please do not use if you have an allergy to CHG or antibacterial soaps. If your skin becomes reddened/irritated stop using the CHG.  Do not shave (including legs and underarms) for at least 48 hours prior to first CHG shower. It is OK to shave your face.  Please follow these instructions carefully.     Shower the NIGHT BEFORE SURGERY and the MORNING OF SURGERY with CHG Soap.   If you chose to wash your hair, wash your hair first as usual with your normal shampoo. After you shampoo, rinse your hair and body thoroughly to remove the  shampoo.  Then [employer] and genitals (private parts) with your normal soap and rinse thoroughly to remove soap.  After that Use CHG Soap as you would any other liquid soap. You can apply CHG directly to the skin and wash gently with a scrungie or a clean washcloth.   Apply the CHG Soap to your body ONLY FROM THE NECK DOWN.  Do not use on open wounds or open sores. Avoid contact with your eyes, ears, mouth and genitals (private parts). Wash Face and genitals (private parts)  with your normal soap.   Wash thoroughly, paying special attention to the area where your surgery will be  performed.  Thoroughly rinse your body with warm water from the neck down.  DO NOT shower/wash with your normal soap after using and rinsing off the CHG Soap.  Pat yourself dry with a CLEAN TOWEL.  Wear CLEAN PAJAMAS to bed the night before surgery  Place CLEAN SHEETS on your bed the night before your surgery  DO NOT SLEEP WITH PETS.   Day of Surgery:  Take a shower with CHG soap. Wear Clean/Comfortable clothing the morning of surgery Do not apply any deodorants/lotions.   Remember to brush your teeth WITH YOUR REGULAR TOOTHPASTE.   If you received a COVID test during your pre-op visit, it is requested that you wear a mask when out in public, stay away from anyone that may not be feeling well, and notify your surgeon if you develop symptoms. If you have been in contact with anyone that has tested positive in the last 10 days, please notify your surgeon.    Please read over the following fact sheets that you were given.

## 2022-02-21 ENCOUNTER — Encounter (HOSPITAL_COMMUNITY): Payer: Self-pay

## 2022-02-21 ENCOUNTER — Ambulatory Visit (HOSPITAL_COMMUNITY)
Admission: RE | Admit: 2022-02-21 | Discharge: 2022-02-21 | Disposition: A | Discharge status: Home / Self Care | Payer: Medicare HMO | Source: Ambulatory Visit | Attending: Thoracic Surgery (Cardiothoracic Vascular Surgery) | Admitting: Thoracic Surgery (Cardiothoracic Vascular Surgery)

## 2022-02-21 ENCOUNTER — Other Ambulatory Visit: Payer: Self-pay

## 2022-02-21 ENCOUNTER — Encounter (HOSPITAL_COMMUNITY)
Admission: RE | Admit: 2022-02-21 | Discharge: 2022-02-21 | Disposition: A | Discharge status: Home / Self Care | Payer: Medicare HMO | Source: Ambulatory Visit | Attending: Thoracic Surgery (Cardiothoracic Vascular Surgery) | Admitting: Thoracic Surgery (Cardiothoracic Vascular Surgery)

## 2022-02-21 VITALS — BP 137/81 | HR 66 | Temp 97.7°F | Resp 18 | Ht 74.0 in | Wt 214.2 lb

## 2022-02-21 DIAGNOSIS — Z01818 Encounter for other preprocedural examination: Secondary | ICD-10-CM | POA: Insufficient documentation

## 2022-02-21 DIAGNOSIS — I2510 Atherosclerotic heart disease of native coronary artery without angina pectoris: Secondary | ICD-10-CM

## 2022-02-21 DIAGNOSIS — Z8673 Personal history of transient ischemic attack (TIA), and cerebral infarction without residual deficits: Secondary | ICD-10-CM | POA: Insufficient documentation

## 2022-02-21 DIAGNOSIS — Z20822 Contact with and (suspected) exposure to covid-19: Secondary | ICD-10-CM | POA: Insufficient documentation

## 2022-02-21 DIAGNOSIS — I10 Essential (primary) hypertension: Secondary | ICD-10-CM | POA: Insufficient documentation

## 2022-02-21 DIAGNOSIS — Q2546 Tortuous aortic arch: Secondary | ICD-10-CM | POA: Insufficient documentation

## 2022-02-21 HISTORY — DX: Atherosclerotic heart disease of native coronary artery without angina pectoris: I25.10

## 2022-02-21 HISTORY — DX: Anxiety disorder, unspecified: F41.9

## 2022-02-21 LAB — CBC
HCT: 50.2 % (ref 39.0–52.0)
Hemoglobin: 17.6 g/dL — ABNORMAL HIGH (ref 13.0–17.0)
MCH: 33.3 pg (ref 26.0–34.0)
MCHC: 35.1 g/dL (ref 30.0–36.0)
MCV: 95.1 fL (ref 80.0–100.0)
Platelets: 163 10*3/uL (ref 150–400)
RBC: 5.28 MIL/uL (ref 4.22–5.81)
RDW: 12.1 % (ref 11.5–15.5)
WBC: 7.8 10*3/uL (ref 4.0–10.5)
nRBC: 0 % (ref 0.0–0.2)

## 2022-02-21 LAB — COMPREHENSIVE METABOLIC PANEL
ALT: 18 U/L (ref 0–44)
AST: 25 U/L (ref 15–41)
Albumin: 4.1 g/dL (ref 3.5–5.0)
Alkaline Phosphatase: 49 U/L (ref 38–126)
Anion gap: 10 (ref 5–15)
BUN: 14 mg/dL (ref 8–23)
CO2: 19 mmol/L — ABNORMAL LOW (ref 22–32)
Calcium: 9.4 mg/dL (ref 8.9–10.3)
Chloride: 111 mmol/L (ref 98–111)
Creatinine, Ser: 0.78 mg/dL (ref 0.61–1.24)
GFR, Estimated: >60 mL/min (ref >60)
Glucose, Bld: 209 mg/dL — ABNORMAL HIGH (ref 70–99)
Potassium: 4.1 mmol/L (ref 3.5–5.1)
Sodium: 140 mmol/L (ref 135–145)
Total Bilirubin: 1.9 mg/dL — ABNORMAL HIGH (ref 0.3–1.2)
Total Protein: 7.5 g/dL (ref 6.5–8.1)

## 2022-02-21 LAB — URINALYSIS, ROUTINE W REFLEX MICROSCOPIC
Bilirubin Urine: NEGATIVE
Glucose, UA: 50 mg/dL — AB
Hgb urine dipstick: NEGATIVE
Ketones, ur: NEGATIVE mg/dL
Leukocytes,Ua: NEGATIVE
Nitrite: NEGATIVE
Protein, ur: NEGATIVE mg/dL
Specific Gravity, Urine: 1.02 (ref 1.005–1.030)
pH: 5 (ref 5.0–8.0)

## 2022-02-21 LAB — BLOOD GAS, ARTERIAL
Acid-Base Excess: 0.1 mmol/L (ref 0.0–2.0)
Bicarbonate: 23.9 mmol/L (ref 20.0–28.0)
Drawn by: 58793
O2 Saturation: 98.9 %
Patient temperature: 37
pCO2 arterial: 36 mmHg (ref 32–48)
pH, Arterial: 7.43 (ref 7.35–7.45)
pO2, Arterial: 112 mmHg — ABNORMAL HIGH (ref 83–108)

## 2022-02-21 LAB — TYPE AND SCREEN
ABO/RH(D): O POS
Antibody Screen: NEGATIVE

## 2022-02-21 LAB — HEMOGLOBIN A1C
Hgb A1c MFr Bld: 5.6 % (ref 4.8–5.6)
Mean Plasma Glucose: 114.02 mg/dL

## 2022-02-21 LAB — SURGICAL PCR SCREEN
MRSA, PCR: NEGATIVE
Staphylococcus aureus: NEGATIVE

## 2022-02-21 LAB — SARS CORONAVIRUS 2 (TAT 6-24 HRS): SARS Coronavirus 2: NEGATIVE

## 2022-02-21 LAB — APTT: aPTT: 33 seconds (ref 24–36)

## 2022-02-21 LAB — PROTIME-INR
INR: 1.1 (ref 0.8–1.2)
Prothrombin Time: 14.3 seconds (ref 11.4–15.2)

## 2022-02-21 NOTE — Progress Notes (Signed)
Abnormal labs in PAT: Hgb 17.6; Glucose, UA 50; Glucose 209 - patient pre-diabetic. Dr. Kipp Brood office was notified Thurmond Butts, Rolena Infante, [place]).

## 2022-02-21 NOTE — Progress Notes (Signed)
PCP - Clyde Lundborg, MD Cardiologist - Arnette Norris. Nahser, MD  PPM/ICD - denies Device Orders - n/a Rep Notified - n/a  Chest x-ray - 02/21/2022 EKG - 02/21/2022 Stress Test - 10/22/2020 ECHO - 02/11/2022 Cardiac Cath - 02/11/2022  Sleep Study - patient verbalized that he had a sleep study done in the past which was negative for OSA CPAP - n/a  Fasting Blood Sugar - n/a  Blood Thinner Instructions: n/a  Aspirin Instructions - hold the day of surgery  Patient was instructed: As of today, STOP taking any Aspirin (unless otherwise instructed by your surgeon) Aleve, Naproxen, Ibuprofen, Motrin, Advil, Goody's, BC's, all herbal medications, fish oil, and all vitamins.  ERAS Protcol - n/a  COVID TEST- yes, done in PAT on 02/21/2022   Anesthesia review: yes - CAD; patient had a recent cardiac cath  Patient denies shortness of breath, fever, cough and chest pain at PAT appointment   All instructions explained to the patient, with a verbal understanding of the material. Patient agrees to go over the instructions while at home for a better understanding. Patient also instructed to self quarantine after being tested for COVID-19. The opportunity to ask questions was provided.

## 2022-02-21 NOTE — Progress Notes (Signed)
Patient states that he has a heat rash under left breast from recent trip to [place].  Patient states that he has been self treating at home with lamisil.  No oozing noted.  Levonne Spiller, RN made aware.

## 2022-02-24 ENCOUNTER — Encounter (HOSPITAL_COMMUNITY): Payer: Self-pay | Admitting: Thoracic Surgery (Cardiothoracic Vascular Surgery)

## 2022-02-24 MED ORDER — DEXMEDETOMIDINE HCL IN NACL 400 MCG/100ML IV SOLN
0.1000 ug/kg/h | INTRAVENOUS | Status: AC
  Administered 2022-02-25: .5 ug/kg/h via INTRAVENOUS
  Filled 2022-02-24: qty 100

## 2022-02-24 MED ORDER — CEFAZOLIN SODIUM-DEXTROSE 2-4 GM/100ML-% IV SOLN
2.0000 g | INTRAVENOUS | Status: AC
  Administered 2022-02-25: 2 g via INTRAVENOUS
  Filled 2022-02-24: qty 100

## 2022-02-24 MED ORDER — EPINEPHRINE HCL 5 MG/250ML IV SOLN IN NS
0.0000 ug/min | INTRAVENOUS | Status: DC
  Filled 2022-02-24: qty 250

## 2022-02-24 MED ORDER — VANCOMYCIN HCL 1500 MG/300ML IV SOLN
1500.0000 mg | INTRAVENOUS | Status: AC
  Administered 2022-02-25: 1500 mg via INTRAVENOUS
  Filled 2022-02-24: qty 300

## 2022-02-24 MED ORDER — TRANEXAMIC ACID (OHS) PUMP PRIME SOLUTION
2.0000 mg/kg | INTRAVENOUS | Status: DC
  Filled 2022-02-24: qty 1.94

## 2022-02-24 MED ORDER — MILRINONE LACTATE IN DEXTROSE 20-5 MG/100ML-% IV SOLN
0.3000 ug/kg/min | INTRAVENOUS | Status: DC
  Filled 2022-02-24: qty 100

## 2022-02-24 MED ORDER — NOREPINEPHRINE 4 MG/250ML-% IV SOLN
0.0000 ug/min | INTRAVENOUS | Status: DC
  Filled 2022-02-24: qty 250

## 2022-02-24 MED ORDER — HEPARIN 30,000 UNITS/1000 ML (OHS) CELLSAVER SOLUTION
Status: DC
  Filled 2022-02-24: qty 1000

## 2022-02-24 MED ORDER — TRANEXAMIC ACID 1000 MG/10ML IV SOLN
1.5000 mg/kg/h | INTRAVENOUS | Status: AC
  Administered 2022-02-25: 1.5 mg/kg/h via INTRAVENOUS
  Filled 2022-02-24: qty 25

## 2022-02-24 MED ORDER — MANNITOL 20 % IV SOLN
INTRAVENOUS | Status: DC
  Filled 2022-02-24: qty 13

## 2022-02-24 MED ORDER — NITROGLYCERIN IN D5W 200-5 MCG/ML-% IV SOLN
2.0000 ug/min | INTRAVENOUS | Status: DC
  Filled 2022-02-24: qty 250

## 2022-02-24 MED ORDER — INSULIN REGULAR(HUMAN) IN NACL 100-0.9 UT/100ML-% IV SOLN
INTRAVENOUS | Status: AC
  Administered 2022-02-25: 1.3 [IU]/h via INTRAVENOUS
  Filled 2022-02-24: qty 100

## 2022-02-24 MED ORDER — PLASMA-LYTE A IV SOLN
INTRAVENOUS | Status: DC
  Filled 2022-02-24: qty 2.5

## 2022-02-24 MED ORDER — PHENYLEPHRINE HCL-NACL 20-0.9 MG/250ML-% IV SOLN
30.0000 ug/min | INTRAVENOUS | Status: AC
  Administered 2022-02-25: 25 ug/min via INTRAVENOUS
  Filled 2022-02-24: qty 250

## 2022-02-24 MED ORDER — POTASSIUM CHLORIDE 2 MEQ/ML IV SOLN
80.0000 meq | INTRAVENOUS | Status: DC
  Filled 2022-02-24: qty 40

## 2022-02-24 MED ORDER — TRANEXAMIC ACID (OHS) BOLUS VIA INFUSION
15.0000 mg/kg | INTRAVENOUS | Status: AC
  Administered 2022-02-25: 1458 mg via INTRAVENOUS
  Filled 2022-02-24: qty 1458

## 2022-02-25 ENCOUNTER — Inpatient Hospital Stay (HOSPITAL_COMMUNITY): Payer: Medicare HMO | Admitting: Certified Registered Nurse Anesthetist

## 2022-02-25 ENCOUNTER — Inpatient Hospital Stay (HOSPITAL_COMMUNITY)
Admission: RE | Admit: 2022-02-25 | Discharge: 2022-03-01 | DRG: 236 | Disposition: A | Discharge status: Home / Self Care | Payer: Medicare HMO | Attending: Thoracic Surgery (Cardiothoracic Vascular Surgery) | Admitting: Thoracic Surgery (Cardiothoracic Vascular Surgery)

## 2022-02-25 ENCOUNTER — Inpatient Hospital Stay (HOSPITAL_COMMUNITY): Payer: Medicare HMO

## 2022-02-25 ENCOUNTER — Encounter (HOSPITAL_COMMUNITY): Payer: Self-pay | Admitting: Thoracic Surgery (Cardiothoracic Vascular Surgery)

## 2022-02-25 ENCOUNTER — Other Ambulatory Visit: Payer: Self-pay

## 2022-02-25 ENCOUNTER — Inpatient Hospital Stay (HOSPITAL_COMMUNITY): Payer: Medicare HMO | Admitting: Physician Assistant

## 2022-02-25 ENCOUNTER — Inpatient Hospital Stay (HOSPITAL_COMMUNITY)
Admission: RE | Disposition: A | Discharge status: Home / Self Care | Payer: Self-pay | Source: Home / Self Care | Attending: Thoracic Surgery (Cardiothoracic Vascular Surgery)

## 2022-02-25 DIAGNOSIS — E785 Hyperlipidemia, unspecified: Secondary | ICD-10-CM | POA: Diagnosis present

## 2022-02-25 DIAGNOSIS — I2510 Atherosclerotic heart disease of native coronary artery without angina pectoris: Secondary | ICD-10-CM | POA: Diagnosis not present

## 2022-02-25 DIAGNOSIS — I310 Chronic adhesive pericarditis: Secondary | ICD-10-CM | POA: Diagnosis present

## 2022-02-25 DIAGNOSIS — I25119 Atherosclerotic heart disease of native coronary artery with unspecified angina pectoris: Secondary | ICD-10-CM

## 2022-02-25 DIAGNOSIS — Z8249 Family history of ischemic heart disease and other diseases of the circulatory system: Secondary | ICD-10-CM | POA: Diagnosis not present

## 2022-02-25 DIAGNOSIS — I469 Cardiac arrest, cause unspecified: Secondary | ICD-10-CM | POA: Diagnosis not present

## 2022-02-25 DIAGNOSIS — N400 Enlarged prostate without lower urinary tract symptoms: Secondary | ICD-10-CM | POA: Diagnosis not present

## 2022-02-25 DIAGNOSIS — Z79899 Other long term (current) drug therapy: Secondary | ICD-10-CM | POA: Diagnosis not present

## 2022-02-25 DIAGNOSIS — K5900 Constipation, unspecified: Secondary | ICD-10-CM | POA: Diagnosis not present

## 2022-02-25 DIAGNOSIS — Z8052 Family history of malignant neoplasm of bladder: Secondary | ICD-10-CM | POA: Diagnosis not present

## 2022-02-25 DIAGNOSIS — Z85828 Personal history of other malignant neoplasm of skin: Secondary | ICD-10-CM | POA: Diagnosis not present

## 2022-02-25 DIAGNOSIS — D72829 Elevated white blood cell count, unspecified: Secondary | ICD-10-CM | POA: Diagnosis not present

## 2022-02-25 DIAGNOSIS — Z981 Arthrodesis status: Secondary | ICD-10-CM

## 2022-02-25 DIAGNOSIS — Z87442 Personal history of urinary calculi: Secondary | ICD-10-CM

## 2022-02-25 DIAGNOSIS — E8770 Fluid overload, unspecified: Secondary | ICD-10-CM | POA: Diagnosis not present

## 2022-02-25 DIAGNOSIS — I351 Nonrheumatic aortic (valve) insufficiency: Secondary | ICD-10-CM | POA: Diagnosis not present

## 2022-02-25 DIAGNOSIS — Z794 Long term (current) use of insulin: Secondary | ICD-10-CM

## 2022-02-25 DIAGNOSIS — D62 Acute posthemorrhagic anemia: Secondary | ICD-10-CM | POA: Diagnosis not present

## 2022-02-25 DIAGNOSIS — K219 Gastro-esophageal reflux disease without esophagitis: Secondary | ICD-10-CM | POA: Diagnosis present

## 2022-02-25 DIAGNOSIS — I7121 Aneurysm of the ascending aorta, without rupture: Secondary | ICD-10-CM | POA: Diagnosis not present

## 2022-02-25 DIAGNOSIS — F32A Depression, unspecified: Secondary | ICD-10-CM | POA: Diagnosis not present

## 2022-02-25 DIAGNOSIS — Z7982 Long term (current) use of aspirin: Secondary | ICD-10-CM

## 2022-02-25 DIAGNOSIS — K449 Diaphragmatic hernia without obstruction or gangrene: Secondary | ICD-10-CM

## 2022-02-25 DIAGNOSIS — I10 Essential (primary) hypertension: Secondary | ICD-10-CM | POA: Diagnosis not present

## 2022-02-25 DIAGNOSIS — Z8042 Family history of malignant neoplasm of prostate: Secondary | ICD-10-CM

## 2022-02-25 DIAGNOSIS — Z8781 Personal history of (healed) traumatic fracture: Secondary | ICD-10-CM | POA: Diagnosis not present

## 2022-02-25 DIAGNOSIS — Z8673 Personal history of transient ischemic attack (TIA), and cerebral infarction without residual deficits: Secondary | ICD-10-CM

## 2022-02-25 DIAGNOSIS — D696 Thrombocytopenia, unspecified: Secondary | ICD-10-CM | POA: Diagnosis not present

## 2022-02-25 DIAGNOSIS — J9811 Atelectasis: Secondary | ICD-10-CM | POA: Diagnosis not present

## 2022-02-25 DIAGNOSIS — M1990 Unspecified osteoarthritis, unspecified site: Secondary | ICD-10-CM | POA: Diagnosis not present

## 2022-02-25 DIAGNOSIS — R7303 Prediabetes: Secondary | ICD-10-CM | POA: Diagnosis present

## 2022-02-25 DIAGNOSIS — J939 Pneumothorax, unspecified: Secondary | ICD-10-CM | POA: Diagnosis not present

## 2022-02-25 DIAGNOSIS — R112 Nausea with vomiting, unspecified: Secondary | ICD-10-CM | POA: Diagnosis not present

## 2022-02-25 DIAGNOSIS — Z951 Presence of aortocoronary bypass graft: Principal | ICD-10-CM

## 2022-02-25 DIAGNOSIS — R0602 Shortness of breath: Secondary | ICD-10-CM | POA: Diagnosis not present

## 2022-02-25 HISTORY — PX: TEE WITHOUT CARDIOVERSION: SHX5443

## 2022-02-25 HISTORY — PX: CORONARY ARTERY BYPASS GRAFT: SHX141

## 2022-02-25 HISTORY — PX: ENDOVEIN HARVEST OF GREATER SAPHENOUS VEIN: SHX5059

## 2022-02-25 LAB — POCT I-STAT, CHEM 8
BUN: 15 mg/dL (ref 8–23)
BUN: 14 mg/dL (ref 8–23)
BUN: 13 mg/dL (ref 8–23)
BUN: 12 mg/dL (ref 8–23)
BUN: 13 mg/dL (ref 8–23)
Calcium, Ion: 1.25 mmol/L (ref 1.15–1.40)
Calcium, Ion: 1.07 mmol/L — ABNORMAL LOW (ref 1.15–1.40)
Calcium, Ion: 1.01 mmol/L — ABNORMAL LOW (ref 1.15–1.40)
Calcium, Ion: 1.19 mmol/L (ref 1.15–1.40)
Calcium, Ion: 1.26 mmol/L (ref 1.15–1.40)
Chloride: 104 mmol/L (ref 98–111)
Chloride: 105 mmol/L (ref 98–111)
Chloride: 104 mmol/L (ref 98–111)
Chloride: 103 mmol/L (ref 98–111)
Chloride: 104 mmol/L (ref 98–111)
Creatinine, Ser: 0.6 mg/dL — ABNORMAL LOW (ref 0.61–1.24)
Creatinine, Ser: 0.6 mg/dL — ABNORMAL LOW (ref 0.61–1.24)
Creatinine, Ser: 0.5 mg/dL — ABNORMAL LOW (ref 0.61–1.24)
Creatinine, Ser: 0.5 mg/dL — ABNORMAL LOW (ref 0.61–1.24)
Creatinine, Ser: 0.5 mg/dL — ABNORMAL LOW (ref 0.61–1.24)
Glucose, Bld: 122 mg/dL — ABNORMAL HIGH (ref 70–99)
Glucose, Bld: 138 mg/dL — ABNORMAL HIGH (ref 70–99)
Glucose, Bld: 118 mg/dL — ABNORMAL HIGH (ref 70–99)
Glucose, Bld: 126 mg/dL — ABNORMAL HIGH (ref 70–99)
Glucose, Bld: 172 mg/dL — ABNORMAL HIGH (ref 70–99)
HCT: 44 % (ref 39.0–52.0)
HCT: 44 % (ref 39.0–52.0)
HCT: 36 % — ABNORMAL LOW (ref 39.0–52.0)
HCT: 30 % — ABNORMAL LOW (ref 39.0–52.0)
HCT: 34 % — ABNORMAL LOW (ref 39.0–52.0)
Hemoglobin: 15 g/dL (ref 13.0–17.0)
Hemoglobin: 15 g/dL (ref 13.0–17.0)
Hemoglobin: 12.2 g/dL — ABNORMAL LOW (ref 13.0–17.0)
Hemoglobin: 10.2 g/dL — ABNORMAL LOW (ref 13.0–17.0)
Hemoglobin: 11.6 g/dL — ABNORMAL LOW (ref 13.0–17.0)
Potassium: 3.9 mmol/L (ref 3.5–5.1)
Potassium: 4.2 mmol/L (ref 3.5–5.1)
Potassium: 5.1 mmol/L (ref 3.5–5.1)
Potassium: 4.3 mmol/L (ref 3.5–5.1)
Potassium: 4.6 mmol/L (ref 3.5–5.1)
Sodium: 142 mmol/L (ref 135–145)
Sodium: 139 mmol/L (ref 135–145)
Sodium: 138 mmol/L (ref 135–145)
Sodium: 139 mmol/L (ref 135–145)
Sodium: 139 mmol/L (ref 135–145)
TCO2: 33 mmol/L — ABNORMAL HIGH (ref 22–32)
TCO2: 28 mmol/L (ref 22–32)
TCO2: 27 mmol/L (ref 22–32)
TCO2: 25 mmol/L (ref 22–32)
TCO2: 27 mmol/L (ref 22–32)

## 2022-02-25 LAB — POCT I-STAT 7, (LYTES, BLD GAS, ICA,H+H)
Acid-Base Excess: 1 mmol/L (ref 0.0–2.0)
Acid-Base Excess: 2 mmol/L (ref 0.0–2.0)
Acid-base deficit: 2 mmol/L (ref 0.0–2.0)
Acid-base deficit: 5 mmol/L — ABNORMAL HIGH (ref 0.0–2.0)
Bicarbonate: 25.7 mmol/L (ref 20.0–28.0)
Bicarbonate: 26.1 mmol/L (ref 20.0–28.0)
Bicarbonate: 22.3 mmol/L (ref 20.0–28.0)
Bicarbonate: 21.4 mmol/L (ref 20.0–28.0)
Calcium, Ion: 0.98 mmol/L — ABNORMAL LOW (ref 1.15–1.40)
Calcium, Ion: 1.21 mmol/L (ref 1.15–1.40)
Calcium, Ion: 1.17 mmol/L (ref 1.15–1.40)
Calcium, Ion: 1.18 mmol/L (ref 1.15–1.40)
HCT: 34 % — ABNORMAL LOW (ref 39.0–52.0)
HCT: 32 % — ABNORMAL LOW (ref 39.0–52.0)
HCT: 35 % — ABNORMAL LOW (ref 39.0–52.0)
HCT: 38 % — ABNORMAL LOW (ref 39.0–52.0)
Hemoglobin: 11.6 g/dL — ABNORMAL LOW (ref 13.0–17.0)
Hemoglobin: 10.9 g/dL — ABNORMAL LOW (ref 13.0–17.0)
Hemoglobin: 11.9 g/dL — ABNORMAL LOW (ref 13.0–17.0)
Hemoglobin: 12.9 g/dL — ABNORMAL LOW (ref 13.0–17.0)
O2 Saturation: 100 %
O2 Saturation: 100 %
O2 Saturation: 99 %
O2 Saturation: 98 %
Patient temperature: 36.3
Patient temperature: 98.8
Potassium: 4.2 mmol/L (ref 3.5–5.1)
Potassium: 4.3 mmol/L (ref 3.5–5.1)
Potassium: 3.8 mmol/L (ref 3.5–5.1)
Potassium: 3.9 mmol/L (ref 3.5–5.1)
Sodium: 140 mmol/L (ref 135–145)
Sodium: 139 mmol/L (ref 135–145)
Sodium: 143 mmol/L (ref 135–145)
Sodium: 145 mmol/L (ref 135–145)
TCO2: 27 mmol/L (ref 22–32)
TCO2: 27 mmol/L (ref 22–32)
TCO2: 23 mmol/L (ref 22–32)
TCO2: 23 mmol/L (ref 22–32)
pCO2 arterial: 38.7 mmHg (ref 32–48)
pCO2 arterial: 39.3 mmHg (ref 32–48)
pCO2 arterial: 35.4 mmHg (ref 32–48)
pCO2 arterial: 42.5 mmHg (ref 32–48)
pH, Arterial: 7.431 (ref 7.35–7.45)
pH, Arterial: 7.43 (ref 7.35–7.45)
pH, Arterial: 7.404 (ref 7.35–7.45)
pH, Arterial: 7.31 — ABNORMAL LOW (ref 7.35–7.45)
pO2, Arterial: 330 mmHg — ABNORMAL HIGH (ref 83–108)
pO2, Arterial: 300 mmHg — ABNORMAL HIGH (ref 83–108)
pO2, Arterial: 154 mmHg — ABNORMAL HIGH (ref 83–108)
pO2, Arterial: 115 mmHg — ABNORMAL HIGH (ref 83–108)

## 2022-02-25 LAB — CBC
HCT: 35.9 % — ABNORMAL LOW (ref 39.0–52.0)
HCT: 37.8 % — ABNORMAL LOW (ref 39.0–52.0)
Hemoglobin: 12.4 g/dL — ABNORMAL LOW (ref 13.0–17.0)
Hemoglobin: 13.4 g/dL (ref 13.0–17.0)
MCH: 33.4 pg (ref 26.0–34.0)
MCH: 33.8 pg (ref 26.0–34.0)
MCHC: 34.5 g/dL (ref 30.0–36.0)
MCHC: 35.4 g/dL (ref 30.0–36.0)
MCV: 96.8 fL (ref 80.0–100.0)
MCV: 95.2 fL (ref 80.0–100.0)
Platelets: 88 10*3/uL — ABNORMAL LOW (ref 150–400)
Platelets: 112 10*3/uL — ABNORMAL LOW (ref 150–400)
RBC: 3.71 MIL/uL — ABNORMAL LOW (ref 4.22–5.81)
RBC: 3.97 MIL/uL — ABNORMAL LOW (ref 4.22–5.81)
RDW: 11.9 % (ref 11.5–15.5)
RDW: 11.9 % (ref 11.5–15.5)
WBC: 12.8 10*3/uL — ABNORMAL HIGH (ref 4.0–10.5)
WBC: 13.3 10*3/uL — ABNORMAL HIGH (ref 4.0–10.5)
nRBC: 0 % (ref 0.0–0.2)
nRBC: 0 % (ref 0.0–0.2)

## 2022-02-25 LAB — GLUCOSE, CAPILLARY
Glucose-Capillary: 132 mg/dL — ABNORMAL HIGH (ref 70–99)
Glucose-Capillary: 131 mg/dL — ABNORMAL HIGH (ref 70–99)
Glucose-Capillary: 143 mg/dL — ABNORMAL HIGH (ref 70–99)
Glucose-Capillary: 132 mg/dL — ABNORMAL HIGH (ref 70–99)
Glucose-Capillary: 122 mg/dL — ABNORMAL HIGH (ref 70–99)
Glucose-Capillary: 139 mg/dL — ABNORMAL HIGH (ref 70–99)
Glucose-Capillary: 162 mg/dL — ABNORMAL HIGH (ref 70–99)
Glucose-Capillary: 171 mg/dL — ABNORMAL HIGH (ref 70–99)

## 2022-02-25 LAB — BASIC METABOLIC PANEL
Anion gap: 7 (ref 5–15)
BUN: 10 mg/dL (ref 8–23)
CO2: 20 mmol/L — ABNORMAL LOW (ref 22–32)
Calcium: 7.6 mg/dL — ABNORMAL LOW (ref 8.9–10.3)
Chloride: 115 mmol/L — ABNORMAL HIGH (ref 98–111)
Creatinine, Ser: 0.84 mg/dL (ref 0.61–1.24)
GFR, Estimated: >60 mL/min (ref >60)
Glucose, Bld: 147 mg/dL — ABNORMAL HIGH (ref 70–99)
Potassium: 3.9 mmol/L (ref 3.5–5.1)
Sodium: 142 mmol/L (ref 135–145)

## 2022-02-25 LAB — POCT I-STAT EG7
Acid-Base Excess: 0 mmol/L (ref 0.0–2.0)
Bicarbonate: 25.2 mmol/L (ref 20.0–28.0)
Calcium, Ion: 1.06 mmol/L — ABNORMAL LOW (ref 1.15–1.40)
HCT: 36 % — ABNORMAL LOW (ref 39.0–52.0)
Hemoglobin: 12.2 g/dL — ABNORMAL LOW (ref 13.0–17.0)
O2 Saturation: 86 %
Potassium: 3.9 mmol/L (ref 3.5–5.1)
Sodium: 142 mmol/L (ref 135–145)
TCO2: 26 mmol/L (ref 22–32)
pCO2, Ven: 40 mmHg — ABNORMAL LOW (ref 44–60)
pH, Ven: 7.406 (ref 7.25–7.43)
pO2, Ven: 51 mmHg — ABNORMAL HIGH (ref 32–45)

## 2022-02-25 LAB — PLATELET COUNT: Platelets: 124 10*3/uL — ABNORMAL LOW (ref 150–400)

## 2022-02-25 LAB — APTT: aPTT: 41 seconds — ABNORMAL HIGH (ref 24–36)

## 2022-02-25 LAB — PROTIME-INR
INR: 1.6 — ABNORMAL HIGH (ref 0.8–1.2)
Prothrombin Time: 18.6 seconds — ABNORMAL HIGH (ref 11.4–15.2)

## 2022-02-25 LAB — HEMOGLOBIN AND HEMATOCRIT, BLOOD
HCT: 35.3 % — ABNORMAL LOW (ref 39.0–52.0)
Hemoglobin: 12.8 g/dL — ABNORMAL LOW (ref 13.0–17.0)

## 2022-02-25 LAB — MAGNESIUM: Magnesium: 2.6 mg/dL — ABNORMAL HIGH (ref 1.7–2.4)

## 2022-02-25 SURGERY — CORONARY ARTERY BYPASS GRAFTING (CABG)
Anesthesia: General | Site: Chest | Laterality: Right

## 2022-02-25 MED ORDER — ROCURONIUM BROMIDE 10 MG/ML (PF) SYRINGE
PREFILLED_SYRINGE | INTRAVENOUS | Status: AC
Start: 2022-02-25 — End: ?
  Filled 2022-02-25: qty 10

## 2022-02-25 MED ORDER — FENTANYL CITRATE (PF) 250 MCG/5ML IJ SOLN
INTRAMUSCULAR | Status: AC
  Filled 2022-02-25: qty 5

## 2022-02-25 MED ORDER — PHENYLEPHRINE HCL-NACL 20-0.9 MG/250ML-% IV SOLN
0.0000 ug/min | INTRAVENOUS | Status: DC
  Administered 2022-02-26: 10 ug/min via INTRAVENOUS
  Administered 2022-02-26: 25 ug/min via INTRAVENOUS
  Filled 2022-02-25: qty 250

## 2022-02-25 MED ORDER — LACTATED RINGERS IV SOLN: INTRAVENOUS | Status: DC | PRN

## 2022-02-25 MED ORDER — PANTOPRAZOLE SODIUM 40 MG PO TBEC
40.0000 mg | DELAYED_RELEASE_TABLET | Freq: Every day | ORAL | Status: DC
  Administered 2022-02-27 – 2022-03-01 (×3): 40 mg via ORAL
  Filled 2022-02-25 (×3): qty 1

## 2022-02-25 MED ORDER — LACTATED RINGERS IV SOLN
500.0000 mL | Freq: Once | INTRAVENOUS | Status: AC | PRN
  Administered 2022-02-25: 500 mL via INTRAVENOUS

## 2022-02-25 MED ORDER — ONDANSETRON HCL 4 MG/2ML IJ SOLN
INTRAMUSCULAR | Status: AC
  Filled 2022-02-25: qty 2

## 2022-02-25 MED ORDER — BISACODYL 10 MG RE SUPP: 10.0000 mg | Freq: Every day | RECTAL | Status: DC

## 2022-02-25 MED ORDER — DEXAMETHASONE SODIUM PHOSPHATE 10 MG/ML IJ SOLN
INTRAMUSCULAR | Status: AC
Start: 2022-02-25 — End: ?
  Filled 2022-02-25: qty 1

## 2022-02-25 MED ORDER — PROPOFOL 10 MG/ML IV BOLUS
INTRAVENOUS | Status: DC | PRN
  Administered 2022-02-25: 50 mg via INTRAVENOUS
  Administered 2022-02-25: 30 mg via INTRAVENOUS

## 2022-02-25 MED ORDER — ACETAMINOPHEN 500 MG PO TABS
1000.0000 mg | ORAL_TABLET | Freq: Four times a day (QID) | ORAL | Status: DC
  Administered 2022-02-26 – 2022-03-01 (×14): 1000 mg via ORAL
  Filled 2022-02-25 (×14): qty 2

## 2022-02-25 MED ORDER — MAGNESIUM SULFATE 4 GM/100ML IV SOLN
4.0000 g | Freq: Once | INTRAVENOUS | Status: AC
  Administered 2022-02-25: 4 g via INTRAVENOUS
  Filled 2022-02-25: qty 100

## 2022-02-25 MED ORDER — ROCURONIUM BROMIDE 10 MG/ML (PF) SYRINGE
PREFILLED_SYRINGE | INTRAVENOUS | Status: AC
  Filled 2022-02-25: qty 10

## 2022-02-25 MED ORDER — ROCURONIUM BROMIDE 10 MG/ML (PF) SYRINGE
PREFILLED_SYRINGE | INTRAVENOUS | Status: DC | PRN
  Administered 2022-02-25: 20 mg via INTRAVENOUS
  Administered 2022-02-25: 40 mg via INTRAVENOUS
  Administered 2022-02-25: 50 mg via INTRAVENOUS
  Administered 2022-02-25: 80 mg via INTRAVENOUS
  Administered 2022-02-25: 30 mg via INTRAVENOUS
  Administered 2022-02-25: 20 mg via INTRAVENOUS

## 2022-02-25 MED ORDER — MIDAZOLAM HCL 2 MG/2ML IJ SOLN: 2.0000 mg | INTRAMUSCULAR | Status: DC | PRN

## 2022-02-25 MED ORDER — ASPIRIN 325 MG PO TBEC
325.0000 mg | DELAYED_RELEASE_TABLET | Freq: Every day | ORAL | Status: DC
  Administered 2022-02-26 – 2022-03-01 (×4): 325 mg via ORAL
  Filled 2022-02-25 (×4): qty 1

## 2022-02-25 MED ORDER — ACETAMINOPHEN 500 MG PO TABS
1000.0000 mg | ORAL_TABLET | Freq: Once | ORAL | Status: AC
  Administered 2022-02-25: 1000 mg via ORAL
  Filled 2022-02-25: qty 2

## 2022-02-25 MED ORDER — INSULIN REGULAR(HUMAN) IN NACL 100-0.9 UT/100ML-% IV SOLN: INTRAVENOUS | Status: DC

## 2022-02-25 MED ORDER — ARTIFICIAL TEARS OPHTHALMIC OINT
TOPICAL_OINTMENT | OPHTHALMIC | Status: AC
  Filled 2022-02-25: qty 3.5

## 2022-02-25 MED ORDER — FENTANYL CITRATE PF 50 MCG/ML IJ SOSY
50.0000 ug | PREFILLED_SYRINGE | INTRAMUSCULAR | Status: DC | PRN
  Administered 2022-02-25: 100 ug via INTRAVENOUS
  Administered 2022-02-25 – 2022-02-26 (×3): 50 ug via INTRAVENOUS
  Administered 2022-02-26 (×2): 100 ug via INTRAVENOUS
  Administered 2022-02-26: 50 ug via INTRAVENOUS
  Administered 2022-02-26: 100 ug via INTRAVENOUS
  Filled 2022-02-25 (×2): qty 2
  Filled 2022-02-25: qty 1
  Filled 2022-02-25 (×2): qty 2
  Filled 2022-02-25 (×3): qty 1

## 2022-02-25 MED ORDER — BISACODYL 5 MG PO TBEC
10.0000 mg | DELAYED_RELEASE_TABLET | Freq: Every day | ORAL | Status: DC
Start: 2022-02-26 — End: 2022-03-01
  Administered 2022-02-26 – 2022-03-01 (×4): 10 mg via ORAL
  Filled 2022-02-25 (×4): qty 2

## 2022-02-25 MED ORDER — MIDAZOLAM HCL (PF) 5 MG/ML IJ SOLN
INTRAMUSCULAR | Status: DC | PRN
  Administered 2022-02-25: 1 mg via INTRAVENOUS
  Administered 2022-02-25: 3 mg via INTRAVENOUS
  Administered 2022-02-25: 2 mg via INTRAVENOUS
  Administered 2022-02-25: 1 mg via INTRAVENOUS
  Administered 2022-02-25: 2 mg via INTRAVENOUS
  Administered 2022-02-25: 1 mg via INTRAVENOUS

## 2022-02-25 MED ORDER — LACTATED RINGERS IV SOLN: INTRAVENOUS | Status: DC

## 2022-02-25 MED ORDER — PROTAMINE SULFATE 10 MG/ML IV SOLN
INTRAVENOUS | Status: DC | PRN
  Administered 2022-02-25: 280 mg via INTRAVENOUS
  Administered 2022-02-25: 20 mg via INTRAVENOUS

## 2022-02-25 MED ORDER — ONDANSETRON HCL 4 MG/2ML IJ SOLN
INTRAMUSCULAR | Status: DC | PRN
  Administered 2022-02-25: 4 mg via INTRAVENOUS

## 2022-02-25 MED ORDER — SUCCINYLCHOLINE CHLORIDE 200 MG/10ML IV SOSY
PREFILLED_SYRINGE | INTRAVENOUS | Status: AC
  Filled 2022-02-25: qty 10

## 2022-02-25 MED ORDER — SODIUM CHLORIDE 0.9 % IV SOLN: INTRAVENOUS | Status: DC | PRN

## 2022-02-25 MED ORDER — NICARDIPINE HCL IN NACL 20-0.86 MG/200ML-% IV SOLN: 5.0000 mg/h | INTRAVENOUS | Status: DC

## 2022-02-25 MED ORDER — 0.9 % SODIUM CHLORIDE (POUR BTL) OPTIME
TOPICAL | Status: DC | PRN
  Administered 2022-02-25: 5000 mL

## 2022-02-25 MED ORDER — ACETAMINOPHEN 650 MG RE SUPP
650.0000 mg | Freq: Once | RECTAL | Status: AC
  Administered 2022-02-25: 650 mg via RECTAL

## 2022-02-25 MED ORDER — METOPROLOL TARTRATE 12.5 MG HALF TABLET
12.5000 mg | ORAL_TABLET | Freq: Once | ORAL | Status: DC
  Filled 2022-02-25: qty 1

## 2022-02-25 MED ORDER — PROPOFOL 10 MG/ML IV BOLUS
INTRAVENOUS | Status: AC
  Filled 2022-02-25: qty 20

## 2022-02-25 MED ORDER — CEFAZOLIN SODIUM-DEXTROSE 2-4 GM/100ML-% IV SOLN
2.0000 g | Freq: Three times a day (TID) | INTRAVENOUS | Status: AC
  Administered 2022-02-25 – 2022-02-27 (×6): 2 g via INTRAVENOUS
  Filled 2022-02-25 (×6): qty 100

## 2022-02-25 MED ORDER — LIDOCAINE 2% (20 MG/ML) 5 ML SYRINGE
INTRAMUSCULAR | Status: DC | PRN
  Administered 2022-02-25: 80 mg via INTRAVENOUS

## 2022-02-25 MED ORDER — ORAL CARE MOUTH RINSE
15.0000 mL | OROMUCOSAL | Status: DC
  Administered 2022-02-25 (×3): 15 mL via OROMUCOSAL

## 2022-02-25 MED ORDER — PLASMA-LYTE A IV SOLN
INTRAVENOUS | Status: DC | PRN
  Administered 2022-02-25: 500 mL via INTRAVASCULAR

## 2022-02-25 MED ORDER — SODIUM CHLORIDE 0.45 % IV SOLN: INTRAVENOUS | Status: DC | PRN

## 2022-02-25 MED ORDER — ~~LOC~~ CARDIAC SURGERY, PATIENT & FAMILY EDUCATION
Freq: Once | Status: DC
  Filled 2022-02-25: qty 1

## 2022-02-25 MED ORDER — ORAL CARE MOUTH RINSE: 15.0000 mL | Freq: Once | OROMUCOSAL | Status: AC

## 2022-02-25 MED ORDER — SODIUM CHLORIDE 0.9% FLUSH: 3.0000 mL | INTRAVENOUS | Status: DC | PRN

## 2022-02-25 MED ORDER — CHLORHEXIDINE GLUCONATE 0.12 % MT SOLN
15.0000 mL | OROMUCOSAL | Status: AC
  Administered 2022-02-25: 15 mL via OROMUCOSAL

## 2022-02-25 MED ORDER — DEXAMETHASONE SODIUM PHOSPHATE 10 MG/ML IJ SOLN
INTRAMUSCULAR | Status: DC | PRN
  Administered 2022-02-25: 10 mg via INTRAVENOUS

## 2022-02-25 MED ORDER — SODIUM CHLORIDE 0.9 % IV SOLN: 250.0000 mL | INTRAVENOUS | Status: DC

## 2022-02-25 MED ORDER — CHLORHEXIDINE GLUCONATE 0.12 % MT SOLN
15.0000 mL | Freq: Once | OROMUCOSAL | Status: DC
  Filled 2022-02-25: qty 15

## 2022-02-25 MED ORDER — ACETAMINOPHEN 160 MG/5ML PO SOLN
650.0000 mg | Freq: Once | ORAL | Status: AC
Start: 2022-02-25 — End: 2022-02-25

## 2022-02-25 MED ORDER — SODIUM CHLORIDE 0.9 % IV SOLN: INTRAVENOUS | Status: DC

## 2022-02-25 MED ORDER — VANCOMYCIN HCL IN DEXTROSE 1-5 GM/200ML-% IV SOLN
1000.0000 mg | Freq: Once | INTRAVENOUS | Status: AC
  Administered 2022-02-25: 1000 mg via INTRAVENOUS
  Filled 2022-02-25: qty 200

## 2022-02-25 MED ORDER — ACETAMINOPHEN 160 MG/5ML PO SOLN: 1000.0000 mg | Freq: Four times a day (QID) | ORAL | Status: DC

## 2022-02-25 MED ORDER — FAMOTIDINE IN NACL 20-0.9 MG/50ML-% IV SOLN
20.0000 mg | Freq: Two times a day (BID) | INTRAVENOUS | Status: AC
  Administered 2022-02-25 (×2): 20 mg via INTRAVENOUS
  Filled 2022-02-25 (×2): qty 50

## 2022-02-25 MED ORDER — ALBUMIN HUMAN 5 % IV SOLN: INTRAVENOUS | Status: DC | PRN

## 2022-02-25 MED ORDER — POTASSIUM CHLORIDE 10 MEQ/50ML IV SOLN
10.0000 meq | INTRAVENOUS | Status: AC
  Administered 2022-02-25 (×3): 10 meq via INTRAVENOUS
  Filled 2022-02-25: qty 50

## 2022-02-25 MED ORDER — TRAMADOL HCL 50 MG PO TABS
50.0000 mg | ORAL_TABLET | ORAL | Status: DC | PRN
  Administered 2022-02-25 – 2022-02-27 (×6): 100 mg via ORAL
  Filled 2022-02-25 (×6): qty 2

## 2022-02-25 MED ORDER — FENTANYL CITRATE (PF) 250 MCG/5ML IJ SOLN
INTRAMUSCULAR | Status: DC | PRN
  Administered 2022-02-25: 50 ug via INTRAVENOUS
  Administered 2022-02-25: 100 ug via INTRAVENOUS
  Administered 2022-02-25: 50 ug via INTRAVENOUS
  Administered 2022-02-25: 150 ug via INTRAVENOUS
  Administered 2022-02-25: 100 ug via INTRAVENOUS
  Administered 2022-02-25: 150 ug via INTRAVENOUS
  Administered 2022-02-25 (×2): 100 ug via INTRAVENOUS
  Administered 2022-02-25: 50 ug via INTRAVENOUS
  Administered 2022-02-25: 100 ug via INTRAVENOUS
  Administered 2022-02-25: 150 ug via INTRAVENOUS
  Administered 2022-02-25: 50 ug via INTRAVENOUS
  Administered 2022-02-25: 150 ug via INTRAVENOUS
  Administered 2022-02-25: 50 ug via INTRAVENOUS
  Administered 2022-02-25: 150 ug via INTRAVENOUS

## 2022-02-25 MED ORDER — HEPARIN SODIUM (PORCINE) 1000 UNIT/ML IJ SOLN
INTRAMUSCULAR | Status: DC | PRN
  Administered 2022-02-25: 30000 [IU] via INTRAVENOUS

## 2022-02-25 MED ORDER — PROTAMINE SULFATE 10 MG/ML IV SOLN
INTRAVENOUS | Status: AC
Start: 2022-02-25 — End: ?
  Filled 2022-02-25: qty 25

## 2022-02-25 MED ORDER — METOPROLOL TARTRATE 12.5 MG HALF TABLET
12.5000 mg | ORAL_TABLET | Freq: Two times a day (BID) | ORAL | Status: DC
  Administered 2022-02-25 – 2022-02-27 (×5): 12.5 mg via ORAL
  Filled 2022-02-25 (×5): qty 1

## 2022-02-25 MED ORDER — DOCUSATE SODIUM 100 MG PO CAPS
200.0000 mg | ORAL_CAPSULE | Freq: Every day | ORAL | Status: DC
  Administered 2022-02-26 – 2022-03-01 (×4): 200 mg via ORAL
  Filled 2022-02-25 (×4): qty 2

## 2022-02-25 MED ORDER — MIDAZOLAM HCL (PF) 10 MG/2ML IJ SOLN
INTRAMUSCULAR | Status: AC
  Filled 2022-02-25: qty 2

## 2022-02-25 MED ORDER — CHLORHEXIDINE GLUCONATE 4 % EX LIQD: 30.0000 mL | CUTANEOUS | Status: DC

## 2022-02-25 MED ORDER — NOREPINEPHRINE 4 MG/250ML-% IV SOLN: 0.0000 ug/min | INTRAVENOUS | Status: DC

## 2022-02-25 MED ORDER — PROTAMINE SULFATE 10 MG/ML IV SOLN
INTRAVENOUS | Status: AC
  Filled 2022-02-25: qty 5

## 2022-02-25 MED ORDER — METOPROLOL TARTRATE 5 MG/5ML IV SOLN: 2.5000 mg | INTRAVENOUS | Status: DC | PRN

## 2022-02-25 MED ORDER — HEPARIN SODIUM (PORCINE) 1000 UNIT/ML IJ SOLN
INTRAMUSCULAR | Status: AC
Start: 2022-02-25 — End: ?
  Filled 2022-02-25: qty 1

## 2022-02-25 MED ORDER — METOPROLOL TARTRATE 25 MG/10 ML ORAL SUSPENSION
12.5000 mg | Freq: Two times a day (BID) | ORAL | Status: DC
  Filled 2022-02-25 (×2): qty 5

## 2022-02-25 MED ORDER — CHLORHEXIDINE GLUCONATE CLOTH 2 % EX PADS
6.0000 | MEDICATED_PAD | Freq: Every day | CUTANEOUS | Status: DC
  Administered 2022-02-25 – 2022-03-01 (×5): 6 via TOPICAL

## 2022-02-25 MED ORDER — SODIUM CHLORIDE (PF) 0.9 % IJ SOLN
OROMUCOSAL | Status: DC | PRN
  Administered 2022-02-25: 8 mL via TOPICAL

## 2022-02-25 MED ORDER — LIDOCAINE 2% (20 MG/ML) 5 ML SYRINGE
INTRAMUSCULAR | Status: AC
  Filled 2022-02-25: qty 5

## 2022-02-25 MED ORDER — ATORVASTATIN CALCIUM 80 MG PO TABS
80.0000 mg | ORAL_TABLET | Freq: Every day | ORAL | Status: DC
  Administered 2022-02-26 – 2022-03-01 (×4): 80 mg via ORAL
  Filled 2022-02-25 (×4): qty 1

## 2022-02-25 MED ORDER — NITROGLYCERIN IN D5W 200-5 MCG/ML-% IV SOLN: 0.0000 ug/min | INTRAVENOUS | Status: DC

## 2022-02-25 MED ORDER — DEXMEDETOMIDINE HCL IN NACL 400 MCG/100ML IV SOLN
0.0000 ug/kg/h | INTRAVENOUS | Status: DC
  Administered 2022-02-25: 0.7 ug/kg/h via INTRAVENOUS
  Filled 2022-02-25: qty 100

## 2022-02-25 MED ORDER — DEXTROSE 50 % IV SOLN: 0.0000 mL | INTRAVENOUS | Status: DC | PRN

## 2022-02-25 MED ORDER — ASPIRIN 81 MG PO CHEW: 324.0000 mg | CHEWABLE_TABLET | Freq: Every day | ORAL | Status: DC

## 2022-02-25 MED ORDER — ALBUMIN HUMAN 5 % IV SOLN
250.0000 mL | INTRAVENOUS | Status: AC | PRN
  Administered 2022-02-25 (×4): 12.5 g via INTRAVENOUS
  Filled 2022-02-25 (×2): qty 250

## 2022-02-25 MED ORDER — SODIUM CHLORIDE 0.9% FLUSH
3.0000 mL | Freq: Two times a day (BID) | INTRAVENOUS | Status: DC
  Administered 2022-02-26 – 2022-03-01 (×3): 3 mL via INTRAVENOUS

## 2022-02-25 MED ORDER — POTASSIUM CHLORIDE 10 MEQ/50ML IV SOLN
10.0000 meq | INTRAVENOUS | Status: AC
  Administered 2022-02-25 (×3): 10 meq via INTRAVENOUS

## 2022-02-25 MED ORDER — CHLORHEXIDINE GLUCONATE 0.12 % MT SOLN
15.0000 mL | Freq: Once | OROMUCOSAL | Status: AC
  Administered 2022-02-25: 15 mL via OROMUCOSAL

## 2022-02-25 MED ORDER — PHENYLEPHRINE 80 MCG/ML (10ML) SYRINGE FOR IV PUSH (FOR BLOOD PRESSURE SUPPORT)
PREFILLED_SYRINGE | INTRAVENOUS | Status: DC | PRN
  Administered 2022-02-25 (×2): 40 ug via INTRAVENOUS
  Administered 2022-02-25: 80 ug via INTRAVENOUS
  Administered 2022-02-25: 160 ug via INTRAVENOUS

## 2022-02-25 MED ORDER — ONDANSETRON HCL 4 MG/2ML IJ SOLN
4.0000 mg | Freq: Four times a day (QID) | INTRAMUSCULAR | Status: DC | PRN
  Administered 2022-02-25: 4 mg via INTRAVENOUS
  Filled 2022-02-25: qty 2

## 2022-02-25 MED ORDER — CHLORHEXIDINE GLUCONATE 0.12% ORAL RINSE (MEDLINE KIT)
15.0000 mL | Freq: Two times a day (BID) | OROMUCOSAL | Status: DC
  Administered 2022-02-25: 15 mL via OROMUCOSAL

## 2022-02-25 SURGICAL SUPPLY — 85 items
BAG DECANTER FOR FLEXI CONT (MISCELLANEOUS) ×4 IMPLANT
BLADE CLIPPER SURG (BLADE) ×4 IMPLANT
BLADE STERNUM SYSTEM 6 (BLADE) ×4 IMPLANT
BNDG ELASTIC 4X5.8 VLCR STR LF (GAUZE/BANDAGES/DRESSINGS) ×4 IMPLANT
BNDG ELASTIC 6X5.8 VLCR STR LF (GAUZE/BANDAGES/DRESSINGS) ×4 IMPLANT
BNDG GAUZE ELAST 4 BULKY (GAUZE/BANDAGES/DRESSINGS) ×4 IMPLANT
CABLE SURGICAL S-101-97-12 (CABLE) ×4 IMPLANT
CANISTER SUCT 3000ML PPV (MISCELLANEOUS) ×4 IMPLANT
CANNULA MC2 2 STG 29/37 NON-V (CANNULA) ×3 IMPLANT
CANNULA MC2 TWO STAGE (CANNULA) ×4
CANNULA NON VENT 20FR 12 (CANNULA) ×4 IMPLANT
CATH ROBINSON RED A/P 18FR (CATHETERS) ×8 IMPLANT
CLIP RETRACTION 3.0MM CORONARY (MISCELLANEOUS) ×4 IMPLANT
CLIP VESOCCLUDE MED 24/CT (CLIP) IMPLANT
CLIP VESOCCLUDE SM WIDE 24/CT (CLIP) IMPLANT
CONN ST 1/2X1/2  BEN (MISCELLANEOUS) ×4
CONN ST 1/2X1/2 BEN (MISCELLANEOUS) ×3 IMPLANT
CONNECTOR BLAKE 2:1 CARIO BLK (MISCELLANEOUS) ×4 IMPLANT
CONTAINER PROTECT SURGISLUSH (MISCELLANEOUS) ×8 IMPLANT
DERMABOND ADVANCED (GAUZE/BANDAGES/DRESSINGS) ×1
DERMABOND ADVANCED .7 DNX12 (GAUZE/BANDAGES/DRESSINGS) IMPLANT
DRAIN CHANNEL 19F RND (DRAIN) ×12 IMPLANT
DRAIN CONNECTOR BLAKE 1:1 (MISCELLANEOUS) ×4 IMPLANT
DRAPE CARDIOVASCULAR INCISE (DRAPES) ×4
DRAPE INCISE IOBAN 66X45 STRL (DRAPES) IMPLANT
DRAPE SRG 135X102X78XABS (DRAPES) ×3 IMPLANT
DRAPE WARM FLUID 44X44 (DRAPES) ×4 IMPLANT
DRSG AQUACEL AG ADV 3.5X10 (GAUZE/BANDAGES/DRESSINGS) ×4 IMPLANT
DRSG COVADERM 4X14 (GAUZE/BANDAGES/DRESSINGS) ×3 IMPLANT
ELECT BLADE 4.0 EZ CLEAN MEGAD (MISCELLANEOUS) ×4
ELECT REM PT RETURN 9FT ADLT (ELECTROSURGICAL) ×8
ELECTRODE BLDE 4.0 EZ CLN MEGD (MISCELLANEOUS) ×3 IMPLANT
ELECTRODE REM PT RTRN 9FT ADLT (ELECTROSURGICAL) ×6 IMPLANT
FELT TEFLON 1X6 (MISCELLANEOUS) ×7 IMPLANT
GAUZE 4X4 16PLY ~~LOC~~+RFID DBL (SPONGE) ×5 IMPLANT
GAUZE SPONGE 4X4 12PLY STRL (GAUZE/BANDAGES/DRESSINGS) ×7 IMPLANT
GAUZE SPONGE 4X4 12PLY STRL LF (GAUZE/BANDAGES/DRESSINGS) ×1 IMPLANT
GLOVE BIO SURGEON STRL SZ7 (GLOVE) ×9 IMPLANT
GLOVE BIOGEL M STRL SZ7.5 (GLOVE) ×8 IMPLANT
GOWN STRL REUS W/ TWL LRG LVL3 (GOWN DISPOSABLE) ×12 IMPLANT
GOWN STRL REUS W/ TWL XL LVL3 (GOWN DISPOSABLE) ×6 IMPLANT
GOWN STRL REUS W/TWL LRG LVL3 (GOWN DISPOSABLE) ×16
GOWN STRL REUS W/TWL XL LVL3 (GOWN DISPOSABLE) ×8
HEMOSTAT POWDER SURGIFOAM 1G (HEMOSTASIS) ×11 IMPLANT
INSERT SUTURE HOLDER (MISCELLANEOUS) ×4 IMPLANT
KIT BASIN OR (CUSTOM PROCEDURE TRAY) ×4 IMPLANT
KIT SUCTION CATH 14FR (SUCTIONS) ×4 IMPLANT
KIT TURNOVER KIT B (KITS) ×4 IMPLANT
KIT VASOVIEW HEMOPRO 2 VH 4000 (KITS) ×4 IMPLANT
LEAD PACING MYOCARDI (MISCELLANEOUS) ×5 IMPLANT
MARKER GRAFT CORONARY BYPASS (MISCELLANEOUS) ×12 IMPLANT
NS IRRIG 1000ML POUR BTL (IV SOLUTION) ×20 IMPLANT
PACK ACCESSORY CANNULA KIT (KITS) ×4 IMPLANT
PACK E OPEN HEART (SUTURE) ×4 IMPLANT
PACK OPEN HEART (CUSTOM PROCEDURE TRAY) ×4 IMPLANT
PAD ARMBOARD 7.5X6 YLW CONV (MISCELLANEOUS) ×8 IMPLANT
PAD ELECT DEFIB RADIOL ZOLL (MISCELLANEOUS) ×4 IMPLANT
PENCIL BUTTON HOLSTER BLD 10FT (ELECTRODE) ×4 IMPLANT
POSITIONER ACROBAT-I OFFPUMP (MISCELLANEOUS) ×1 IMPLANT
POSITIONER HEAD DONUT 9IN (MISCELLANEOUS) ×4 IMPLANT
PUNCH AORTIC ROTATE 4.0MM (MISCELLANEOUS) ×4 IMPLANT
SET MPS 3-ND DEL (MISCELLANEOUS) ×1 IMPLANT
SPONGE T-LAP 18X18 ~~LOC~~+RFID (SPONGE) ×17 IMPLANT
SUPPORT HEART JANKE-BARRON (MISCELLANEOUS) ×4 IMPLANT
SUT BONE WAX W31G (SUTURE) ×4 IMPLANT
SUT ETHIBOND X763 2 0 SH 1 (SUTURE) ×8 IMPLANT
SUT MNCRL AB 3-0 PS2 18 (SUTURE) ×9 IMPLANT
SUT PDS AB 1 CTX 36 (SUTURE) ×8 IMPLANT
SUT PROLENE 4 0 RB 1 (SUTURE) ×8
SUT PROLENE 4 0 SH DA (SUTURE) ×4 IMPLANT
SUT PROLENE 4-0 RB1 .5 CRCL 36 (SUTURE) IMPLANT
SUT PROLENE 5 0 C 1 36 (SUTURE) ×12 IMPLANT
SUT PROLENE 7 0 BV 1 (SUTURE) ×1 IMPLANT
SUT PROLENE 7 0 BV1 MDA (SUTURE) ×4 IMPLANT
SUT STEEL 6MS V (SUTURE) ×8 IMPLANT
SUT VIC AB 2-0 CT1 27 (SUTURE) ×4
SUT VIC AB 2-0 CT1 TAPERPNT 27 (SUTURE) IMPLANT
SYSTEM SAHARA CHEST DRAIN ATS (WOUND CARE) ×4 IMPLANT
TAPE PAPER 2X10 WHT MICROPORE (GAUZE/BANDAGES/DRESSINGS) ×1 IMPLANT
TOWEL GREEN STERILE (TOWEL DISPOSABLE) ×4 IMPLANT
TOWEL GREEN STERILE FF (TOWEL DISPOSABLE) ×4 IMPLANT
TRAY FOLEY SLVR 16FR TEMP STAT (SET/KITS/TRAYS/PACK) ×4 IMPLANT
TUBING LAP HI FLOW INSUFFLATIO (TUBING) ×4 IMPLANT
UNDERPAD 30X36 HEAVY ABSORB (UNDERPADS AND DIAPERS) ×4 IMPLANT
WATER STERILE IRR 1000ML POUR (IV SOLUTION) ×8 IMPLANT

## 2022-02-25 NOTE — Interval H&P Note (Signed)
History and Physical Interval Note:  02/25/2022 7:31 AM  Kyle Davidson  has presented today for surgery, with the diagnosis of CAD.  The various methods of treatment have been discussed with the patient and family. After consideration of risks, benefits and other options for treatment, the patient has consented to  Procedure(s): CORONARY ARTERY BYPASS GRAFTING (CABG) (N/A) TRANSESOPHAGEAL ECHOCARDIOGRAM (TEE) (N/A) as a surgical intervention.  The patient's history has been reviewed, patient examined, no change in status, stable for surgery.  I have reviewed the patient's chart and labs.  Questions were answered to the patient's satisfaction.     Twisha Vanpelt Bary Leriche

## 2022-02-25 NOTE — Brief Op Note (Signed)
02/25/2022  11:15 AM  PATIENT:  Kyle Davidson  71 y.o. male  PRE-OPERATIVE DIAGNOSIS:  Coronary artery disease  POST-OPERATIVE DIAGNOSIS:  Coronary artery disease  PROCEDURE:   CORONARY ARTERY BYPASS GRAFTING (CABG) x2 USING LEFT INTERNAL MAMMARY ARTERY AND RIGHT GREATER SAPHENOUS VEIN   LIMA-LAD SVG-OM  TRANSESOPHAGEAL ECHOCARDIOGRAM (TEE) (N/A) ENDOVEIN HARVEST OF GREATER SAPHENOUS VEIN (Right) Vein harvest time: 57mn Vein prep time: 130m  SURGEON:  Lightfoot, HaLucile CraterMD - Primary  PHYSICIAN ASSISTANT: Jadien Lehigh  ASSISTANTS: Philip AspenRNFA   ANESTHESIA:   general  EBL:  15032m BLOOD ADMINISTERED:none  DRAINS:  Left pleural and mediastinal drains    LOCAL MEDICATIONS USED:  NONE  SPECIMEN:  No Specimen  DISPOSITION OF SPECIMEN:  N/A  COUNTS:  Correct  DICTATION: .Dragon Dictation  PLAN OF CARE: Admit to inpatient   PATIENT DISPOSITION:  ICU - intubated and hemodynamically stable.   Delay start of Pharmacological VTE agent (>24hrs) due to surgical blood loss or risk of bleeding: yes

## 2022-02-25 NOTE — Anesthesia Procedure Notes (Signed)
Arterial Line Insertion Start/End6/02/2022 6:50 AM, 02/25/2022 6:58 AM Performed by: Harden Mo, CRNA, CRNA  Patient location: Pre-op. Preanesthetic checklist: patient identified, IV checked, site marked, risks and benefits discussed, surgical consent, monitors and equipment checked, pre-op evaluation and anesthesia consent Lidocaine 1% used for infiltration Left, radial was placed Catheter size: 20 G Hand hygiene performed  and maximum sterile barriers used   Attempts: 1 Procedure performed without using ultrasound guided technique. Ultrasound Notes:anatomy identified, needle tip was noted to be adjacent to the nerve/plexus identified and no ultrasound evidence of intravascular and/or intraneural injection Following insertion, dressing applied and Biopatch. Post procedure assessment: normal and unchanged  Patient tolerated the procedure well with no immediate complications.

## 2022-02-25 NOTE — Transfer of Care (Signed)
Immediate Anesthesia Transfer of Care Note  Patient: Kyle Davidson  Procedure(s) Performed: CORONARY ARTERY BYPASS GRAFTING (CABG) x4 USING LEFT INTERNAL MAMMARY ARTERY AND RIGHT GREATER SAPHENOUS VEIN (Chest) TRANSESOPHAGEAL ECHOCARDIOGRAM (TEE) ENDOVEIN HARVEST OF GREATER SAPHENOUS VEIN (Right)  Patient Location: ICU  Anesthesia Type:General  Level of Consciousness: sedated, unresponsive and Patient remains intubated per anesthesia plan  Airway & Oxygen Therapy: Patient remains intubated per anesthesia plan and Patient placed on Ventilator (see vital sign flow sheet for setting)  Post-op Assessment: Report given to RN and Post -op Vital signs reviewed and stable  Post vital signs: Reviewed and stable  Last Vitals:  Vitals Value Taken Time  BP 110/78   Temp    Pulse 80 02/25/22 1249  Resp 12 02/25/22 1249  SpO2 99 % 02/25/22 1249  Vitals shown include unvalidated device data.  Last Pain:  Vitals:   02/25/22 0604  TempSrc:   PainSc: 0-No pain         Complications: No notable events documented.

## 2022-02-25 NOTE — Anesthesia Procedure Notes (Addendum)
Central Venous Catheter Insertion Performed by: Suzette Battiest, MD, anesthesiologist Start/End6/02/2022 6:45 AM, 02/25/2022 7:00 AM Patient location: Pre-op. Preanesthetic checklist: patient identified, IV checked, site marked, risks and benefits discussed, surgical consent, monitors and equipment checked, pre-op evaluation, timeout performed and anesthesia consent Position: Trendelenburg Lidocaine 1% used for infiltration and patient sedated Hand hygiene performed , maximum sterile barriers used  and Seldinger technique used Catheter size: 8.5 Fr Total catheter length 10. Central line was placed.Sheath introducer Swan type:thermodilution Procedure performed using ultrasound guided technique. Ultrasound Notes:anatomy identified, needle tip was noted to be adjacent to the nerve/plexus identified, no ultrasound evidence of intravascular and/or intraneural injection and image(s) printed for medical record Attempts: 1 Following insertion, line sutured, dressing applied and Biopatch. Post procedure assessment: blood return through all ports, free fluid flow and no air  Patient tolerated the procedure well with no immediate complications. Additional procedure comments: Triple lumen introducer catheter inserted through introducer port. All ports aspirated and flushed.Marland Kitchen

## 2022-02-25 NOTE — Hospital Course (Addendum)
  Referring: Troy Sine, MD Primary Care: Heywood Bene, PA-C Primary Cardiologist:Philip Nahser, MD (Appointments requested 02/25/2022)  History of Present Illness:     71 year old male presents for surgical evaluation of left main coronary artery disease.  The patient states that over the last several months he has had worsening anginal symptoms, and occasional unstable angina.  He admits to some exertional dyspnea.   He has preserved biventricular function on echocardiogram and no significant valvular disease.  CT of the chest does show a 3.7 ascending aortic aneurysm.  We reviewed all of the images discussed risk benefits of surgical revascularization.  He is agreeable to proceed.  He is tentatively scheduled for June 7.  Hospital Course: Kyle Davidson was admitted for elective surgery on 02/25/2022.  He was taken to the operating room where two-vessel coronary bypass grafting was carried out without complication.  Following the procedure, he separated from cardiopulmonary bypass without difficulty.  He was transferred to the ICU in stable condition.  He remained hemodynamically stable and was weaned from the vent support allowing for extubation by 8pm on the day of surgery. Monitoring lines and the pleural tubes were removed routinely and he was mobilized. He was ready for transfer to [facility]E Progressive [facility] on the 2nd post-op day.  On postop day 3 he was having some episodes of sinus tachycardia and beta-blocker was uptitrated.  Oxygen has been fully weaned and he maintains good saturations on room air.  He is voiding well and weight is almost at preop at this point.  He is maintaining normal renal function.  His leukocytosis has shown a steady improvement over time.  His expected acute blood loss anemia has stabilized.  It is not at the transfusion threshold.  Most recent hemoglobin is 11.8.  Incisions are noted to be healing well without evidence of infection.  He is tolerating diet.  He is  tolerating routine cardiac rehab phase 1 modalities.  Overall at the time of discharge the patient is felt to be quite stable.

## 2022-02-25 NOTE — Progress Notes (Signed)
Pt with small rash to feet- pt states is athlete's foot. Pt reports being treated for athletes foot for a while. Pt also has rash to chest. Pt states this is a heat rash that Dr. Kipp Brood is aware of. Pt reports that rash is more red and burning today d/t using CHG wipes on area. Dr. Kipp Brood notified. Per MD this is OK for surgery today.

## 2022-02-25 NOTE — Op Note (Signed)
[address]       [place] [address]             [phone]                                          02/25/2022 Patient:  Darrold Junker Pre-Op Dx: Left main coronary artery disease HTN HLP    Post-op Dx:  same Procedure: CABG X 2.  LIMA LAD, RSVG OM   Endoscopic greater saphenous vein harvest on the right   Surgeon and Role:      * Jakara Blatter, Lucile Crater, MD - [place], PA-C - assisting An experienced assistant was required given the complexity of this surgery and the standard of surgical care. The assistant was needed for exposure, dissection, suctioning, retraction of delicate tissues and sutures, instrument exchange and for overall help during this procedure.    Anesthesia  general EBL:  561m Blood Administration: none Xclamp Time:  55 min Pump Time:  1059m  Drains: 1944 blake drain: L, mediastinal  Wires: ventricular Counts: correct   Indications: [age]ear old male with left main coronary artery disease.  He has preserved biventricular function on echocardiogram and no significant valvular disease.  CT of the chest does show a 3.7 ascending aortic aneurysm.  We reviewed all of the images discussed risk benefits of surgical revascularization.  He is agreeable to proceed.   Findings: Dense pericardial adhesion with calcifications posteriorly.  LAD was intramyocardial.  OM was a good target  Operative Technique: All invasive lines were placed in pre-op holding.  After the risks, benefits and alternatives were thoroughly discussed, the patient was brought to the operative theatre.  Anesthesia was induced, and the patient was prepped and draped in normal sterile fashion.  An appropriate surgical pause was performed, and pre-operative antibiotics were dosed accordingly.  We began with simultaneous incisions along the right leg for harvesting of the greater saphenous vein and the chest for the sternotomy.  In regards to the  sternotomy, this was carried down with bovie cautery, and the sternum was divided with a reciprocating saw.  Meticulous hemostasis was obtained.  The left internal thoracic artery was exposed and harvested in in pedicled fashion.  The patient was systemically heparinized, and the artery was divided distally, and placed in a papaverine sponge.    The sternal elevator was removed, and a retractor was placed.  The pericardium was opened, but there were dense adhesion present.  We worked in a retrograde fashion to expose our cannulation sites.  The pericardium was fashioned into a cradle with pericardial stitches.   After we confirmed an appropriate ACT, the ascending aorta was cannulated in standard fashion.  The right atrial appendage was used for venous cannulation site.  Cardiopulmonary bypass was initiated, and we continued our adhesiolysis.   The cross clamp was applied, and a dose of anterograde cardioplegia was given with good arrest of the heart.  Next we exposed the lateral wall, and found a good target on the OM.  An end to side anastomosis with the vein graft was then created.  Finally, we exposed a good target on the LAD, and fashioned an end to side anastomosis between it and the LITA.  We began to re-warm, and a re-animation dose of cardioplegia was given.  The heart was de-aired, and  the cross clamp was removed.  Meticulous hemostasis was obtained.    A partial occludding clamp was then placed on the ascending aorta, and we created an end to side anastomosis between it and the proximal vein graft.  Rings were placed on the proximal anastomosis.  Hemostasis was obtained, and we separated from cardiopulmonary bypass without event.  The heparin was reversed with protamine.  Chest tubes and wires were placed, and the sternum was re-approximated with sternal wires.  The soft tissue and skin were re-approximated wth absorbable suture.    The patient tolerated the procedure without any immediate  complications, and was transferred to the ICU in guarded condition.  Rubbie Goostree Bary Leriche

## 2022-02-25 NOTE — Procedures (Signed)
Extubation Procedure Note  Patient Details:   Name: Kyle Davidson DOB: 1950/10/28 MRN: [id number]   Airway Documentation:    Vent end date: 02/25/22 Vent end time: 2000   Evaluation  O2 sats: stable throughout Complications: No apparent complications Patient did tolerate procedure well. Bilateral Breath Sounds: Clear, Diminished   Yes  Patient did NIF -24 and FVC 1.2L. RT extubated patient to 4L [place]. Patient able to speak clearly. Patient did 743m on IS x 5. Pt tolerated procedure well. No complications noted.  JAlvera Singh6/02/2022, 9:13 PM

## 2022-02-25 NOTE — Discharge Instructions (Signed)

## 2022-02-25 NOTE — Anesthesia Procedure Notes (Signed)
Procedure Name: Intubation Date/Time: 02/25/2022 7:55 AM Performed by: Harden Mo, CRNA Pre-anesthesia Checklist: Patient identified, Emergency Drugs available, Suction available and Patient being monitored Patient Re-evaluated:Patient Re-evaluated prior to induction Oxygen Delivery Method: Circle System Utilized Preoxygenation: Pre-oxygenation with 100% oxygen Induction Type: IV induction Ventilation: Mask ventilation without difficulty and Oral airway inserted - appropriate to patient size Laryngoscope Size: Glidescope and 4 Grade View: Grade I Tube type: Oral Tube size: 8.0 mm Number of attempts: 1 Airway Equipment and Method: Stylet and Oral airway Placement Confirmation: ETT inserted through vocal cords under direct vision, positive ETCO2 and breath sounds checked- equal and bilateral Secured at: 23 cm Tube secured with: Tape Dental Injury: Teeth and Oropharynx as per pre-operative assessment

## 2022-02-25 NOTE — Discharge Summary (Incomplete)
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         69 LV SV Index:   31 LVOT Area:     4.15 cm  LV Volumes (MOD) LV vol d, MOD A2C: 39.5 ml LV vol d, MOD A4C: 46.2 ml LV vol s, MOD A2C: 13.8 ml LV vol s, MOD A4C: 17.4 ml LV SV MOD A2C:     25.7 ml LV SV MOD A4C:     46.2 ml LV SV MOD BP:      29.3 ml RIGHT VENTRICLE RV S prime:     9.55 cm/s TAPSE (M-mode): 1.5 cm LEFT ATRIUM             Index        RIGHT ATRIUM           Index LA diam:        3.40 cm 1.50 cm/m   RA Area:     12.80 cm LA Vol (A2C):   26.7 ml 11.75 ml/m  RA Volume:   25.40 ml  11.18 ml/m LA Vol (A4C):   28.2 ml 12.41 ml/m LA Biplane Vol: 28.9 ml 12.72 ml/m  AORTIC VALVE LVOT Vmax:   89.20 cm/s LVOT Vmean:  58.700 cm/s LVOT VTI:    0.167 m  AORTA Ao Root diam: 4.00 cm Ao Asc diam:  4.00 cm MITRAL VALVE MV Area (PHT): 3.53 cm     SHUNTS MV Mean grad:  2.0 mmHg     Systemic VTI:  0.17 m MV Decel Time: 215 msec     Systemic Diam: 2.30 cm MV  E velocity: 65.00 cm/s MV A velocity: 111.00 cm/s MV E/A ratio:  0.59 Rudean Haskell MD Electronically signed by Rudean Haskell MD Signature Date/Time: 02/11/2022/12:27:10 PM    Final    CT CORONARY FRACTIONAL FLOW RESERVE DATA PREP  Result Date: 02/04/2022 EXAM: FFRCT ANALYSIS FINDINGS: FFRct analysis was performed on the original cardiac CT angiogram dataset. Diagrammatic representation of the FFRct analysis is provided in a separate PDF document in PACS. This dictation was created using the PDF document and an interactive 3D model of the results. 3D model is not available in the EMR/PACS. Normal FFR  range is >0.80. 1. Left Main: findings 0.90, 0.90 0.90 2. LAD: findings 0.87, 0.74 0.68 3. LCX: findings 0.89, 0.88 <0.50 4. RCA: findings 0.96, 0.92 0.90 IMPRESSION: FFR suggests LAD and Lcx lesions are significant/flow limiting. Note: These examples are not recommendations of [employer] and only provided as examples of what other customers are doing. Electronically Signed   By: Kirk Ruths M.D.   On: 02/04/2022 13:13   CT CORONARY MORPH W/CTA COR W/SCORE W/CA W/CM &/OR WO/CM  Addendum Date: 02/04/2022   ADDENDUM REPORT: 02/04/2022 12:54 CLINICAL DATA:  71 yo male with chest pain EXAM: Cardiac/Coronary CTA TECHNIQUE: A non-contrast, gated CT scan was obtained with axial slices of 3 mm through the heart for calcium scoring. Calcium scoring was performed using the Agatston method. A 120 kV prospective, gated, contrast cardiac scan was obtained. Gantry rotation speed was 250 msecs and collimation was 0.6 mm. Two sublingual nitroglycerin tablets (0.8 mg) were given. The 3D data set was reconstructed in 5% intervals of the 35-75% of the R-R cycle. Diastolic phases were analyzed on a dedicated workstation using MPR, MIP, and VRT modes. The patient received 95 cc of contrast. FINDINGS: Image quality: Excellent, average, poor. Noise artifact is: Limited, moderate, severe. (signal-to-noise  (obese)/beam/movement/mis-registration). Coronary Arteries:  Normal coronary origin.  Right dominance. Left main: The left main is a large caliber vessel with a normal take off from the left coronary cusp that trifurcates into a LAD, LCX, and ramus intermedius. There is moderate (50-69) mixed plaque stenosis in the proximal vessel. Left anterior descending artery: The LAD has mild (25-49) calcified plaque in the proximal vessel followed by severe (70-99) mixed plaque stenosis and then mild (25-49) calcified stenosis. The LAD gives off 1 diagonal branch with mild (25-49) ostial disease. Ramus intermedius: Small. Left circumflex artery: The LCX is non-dominant; there is mild (25-49) calcified plaque in the proximal vessel; there is severe (70-99) soft plaque stenosis in the mid vessel. The LCX gives off 2 obtuse marginal branches. There is mild (25-49) plaque in the proximal OM1 and moderate (50-69) soft plaque stenosis in the mid vessel; there is mild (25-49) calcified stenosis in the ostium of OM2. Right coronary artery: The RCA is dominant with normal take off from the right coronary cusp. There is mild (25-49) mixed plaque stenosis in the proximal vessel. The RCA terminates as a PDA and 2 right posterolateral branch without evidence of plaque or stenosis. Right Atrium: Right atrial size is within normal limits. Right Ventricle: The right ventricular cavity is within normal limits. Left Atrium: Left atrial size is normal in size with no left atrial appendage filling defect. Left Ventricle: The ventricular cavity size is within normal limits. There are no stigmata of prior infarction. There is no abnormal filling defect. Pulmonary arteries: Normal in size without proximal filling defect. Pulmonary veins: Normal pulmonary venous drainage. Pericardium: Normal thickness with no significant effusion; calcification noted (predominantly posterior) pericardium. Cardiac valves: The aortic valve is trileaflet without  significant calcification. The mitral valve is normal structure without significant calcification. Aorta: Normal caliber with no significant disease. Extra-cardiac findings: See attached radiology report for non-cardiac structures. IMPRESSION: 1. Coronary calcium score of 1324. This was 89 percentile for age-, sex, and race-matched controls. 2. Normal coronary origin with right dominance. 3. Severe CAD as outlined above including moderate LM disease and severe stenosis in the LAD and Lcx. 4. Study will be sent for FFR. RECOMMENDATIONS: CAD-RADS 4: Severe stenosis. (70-99% or > 50% left main). Cardiac catheterization or  CT FFR is recommended. Consider symptom-guided anti-ischemic pharmacotherapy as well as risk factor modification per guideline directed care. Invasive coronary angiography recommended with revascularization per published guideline statements. Kirk Ruths, MD Electronically Signed   By: Kirk Ruths M.D.   On: 02/04/2022 12:54   Result Date: 02/04/2022 EXAM: OVER-READ INTERPRETATION  CT CHEST The following report is a limited chest CT over-read performed by radiologist Dr. Vinnie Langton of [facility], [place] on 02/04/2022. This over-read does not include interpretation of cardiac or coronary anatomy or pathology. The coronary calcium score and cardiac CTA interpretation by the cardiologist is attached. COMPARISON:  Chest CTA 03/08/2019. FINDINGS: Extracardiac findings will be described separately under dictation for contemporaneously obtained chest CTA dated 02/04/2022. IMPRESSION: Please see separate dictation for contemporaneously obtained chest CTA dated 02/04/2022 for full description of relevant extracardiac findings. Electronically Signed: By: Vinnie Langton M.D. On: 02/04/2022 10:46   CT ANGIO CHEST AORTA W/CM & OR WO/CM  Result Date: 02/04/2022 CLINICAL DATA:  71 year old male with history of aortic aneurysm. Follow-up study. EXAM: CT ANGIOGRAPHY CHEST WITH CONTRAST  TECHNIQUE: Multidetector CT imaging of the chest was performed using the standard protocol during bolus administration of intravenous contrast. Multiplanar CT image reconstructions and MIPs were obtained to evaluate the vascular anatomy. RADIATION DOSE REDUCTION: This exam was performed according to the departmental dose-optimization program which includes automated exposure control, adjustment of the mA and/or kV according to patient size and/or use of iterative reconstruction technique. CONTRAST:  172m OMNIPAQUE IOHEXOL 350 MG/ML SOLN COMPARISON:  Chest CTA 03/08/2019. FINDINGS: Comment: Superior aspect of the thorax and extreme lung bases were incompletely imaged. Cardiovascular: Heart size is normal. There is no significant pericardial fluid, thickening or pericardial calcification. There is aortic atherosclerosis, as well as atherosclerosis of the great vessels of the mediastinum and the coronary arteries, including calcified atherosclerotic plaque in the left main, left anterior descending, left circumflex and right coronary arteries. Ascending thoracic aorta measures up to 3.7 cm in diameter. Mid aortic arch measures up to 2.5 cm in diameter. Descending thoracic aorta measures 2.2 cm in diameter. No evidence of thoracic aortic dissection. No acute abnormality of the visualized great vessels. Mediastinum/Nodes: No pathologically enlarged mediastinal or hilar lymph nodes. Esophagus is unremarkable in appearance. No axillary lymphadenopathy. Lungs/Pleura: No acute consolidative airspace disease. No pleural effusions. No suspicious appearing pulmonary nodules or masses are noted in the visualized portions of the thorax. Upper Abdomen: Unremarkable. Musculoskeletal: There are no aggressive appearing lytic or blastic lesions noted in the visualized portions of the skeleton. Review of the MIP images confirms the above findings. IMPRESSION: 1. There is aortic atherosclerosis, as well as atherosclerosis of the great  vessels of the mediastinum and the coronary arteries, including calcified atherosclerotic plaque in the left main and three-vessel coronary arteries. 2. No evidence of thoracic aortic aneurysm. No acute findings in the visualized thorax. Aortic Atherosclerosis (ICD10-I70.0). Electronically Signed   By: DVinnie LangtonM.D.   On: 02/04/2022 10:51    Treatments: Surgery  02/25/2022 Patient:  Kyle JunkerPre-Op Dx: Left main coronary artery disease HTN HLP    Post-op Dx:  same Procedure: CABG X 2.  LIMA LAD, RSVG OM   Endoscopic greater saphenous vein harvest on the right     Surgeon and Role:      * Lightfoot, HLucile Crater MD - [place] PA-C - assisting An experienced assistant was required given the complexity of this surgery and the standard of surgical  care. The assistant was needed for exposure, dissection, suctioning, retraction of delicate tissues and sutures, instrument exchange and for overall help during this procedure.     Anesthesia  general EBL:  575m Blood Administration: none Xclamp Time:  55 min Pump Time:  1037m   Drains: 1931 blake drain: L, mediastinal  Wires: ventricular Counts: correct     Indications: [age]ear old male with left main coronary artery disease.  He has preserved biventricular function on echocardiogram and no significant valvular disease.  CT of the chest does show a 3.7 ascending aortic aneurysm.  We reviewed all of the images discussed risk benefits of surgical revascularization.  He is agreeable to proceed.    Findings: Dense pericardial adhesion with calcifications posteriorly.  LAD was intramyocardial.  OM was a good target  Discharge Exam: Blood pressure (!) 145/78, pulse (!) 101, temperature 98.3 F (36.8 C), temperature source Oral, resp. rate 20, height '6\' 3"'$  (1.905 m), weight 98.1 kg, SpO2 97 %. {physical exHENI:[id number]}Disposition:  There are no questions and answers to display.         Allergies as of  02/28/2022       Reactions   Codeine Other (See Comments)   Bad headaches.....makes him see things   Oxycodone    Pt stated, "makes me constipated", hallucinations     Med Rec must be completed [facility]       Follow-up Information     LiLajuana MatteMD Follow up on 03/14/2022.   Specialty: Cardiothoracic Surgery Why: Your telephone follow-up with Dr. LiKipp Broodill be on Friday, 03/14/2022 at about 3 PM.  Do not go to the office.  Dr. LiKipp Broodill call you. Contact information: [address]  [place] [phone]         Nahser, PhWonda ChengMD. Go on 03/18/2022.   Specialty: Cardiology Why: Your appointment is at 10:20 AM. Contact information: [address] [place] [phone]             The patient has been discharged on:   1.Beta Blocker:  Yes [  y ]                              No   [   ]                              If No, reason:  2.Ace Inhibitor/ARB: Yes [  ]                                     No  [ n   ]                                     If No, reason:labile BP  3.Statin:   Yes [  y ]                  No  [   ]                  If No, reason:  4.Ecasa:  Yes  [  y ]  No   [   ]                  If No, reason:  5 ACS/plavix- No   Signed: John Giovanni PA-C 02/28/2022, 1:46 PM

## 2022-02-25 NOTE — Anesthesia Preprocedure Evaluation (Signed)
Anesthesia Evaluation  Patient identified by MRN, date of birth, ID band Patient awake    Reviewed: Allergy & Precautions, NPO status , Patient's Chart, lab work & pertinent test results  History of Anesthesia Complications (+) PONV and history of anesthetic complications  Airway Mallampati: II  TM Distance: >3 FB Neck ROM: Full    Dental  (+) Dental Advisory Given   Pulmonary neg pulmonary ROS,    breath sounds clear to auscultation       Cardiovascular hypertension, Pt. on medications and Pt. on home beta blockers + angina + CAD   Rhythm:Regular Rate:Normal     Neuro/Psych  Neuromuscular disease CVA    GI/Hepatic Neg liver ROS, hiatal hernia, GERD  ,  Endo/Other  negative endocrine ROS  Renal/GU negative Renal ROS     Musculoskeletal  (+) Arthritis ,   Abdominal   Peds  Hematology negative hematology ROS (+)   Anesthesia Other Findings   Reproductive/Obstetrics                             Lab Results  Component Value Date   WBC 7.8 02/21/2022   HGB 17.6 (H) 02/21/2022   HCT 50.2 02/21/2022   MCV 95.1 02/21/2022   PLT 163 02/21/2022   Lab Results  Component Value Date   CREATININE 0.78 02/21/2022   BUN 14 02/21/2022   NA 140 02/21/2022   K 4.1 02/21/2022   CL 111 02/21/2022   CO2 19 (L) 02/21/2022    Anesthesia Physical Anesthesia Plan  ASA: 4  Anesthesia Plan: General   Post-op Pain Management: Tylenol PO (pre-op)*   Induction: Intravenous  PONV Risk Score and Plan: 3 and Midazolam, Dexamethasone, Ondansetron and Treatment may vary due to age or medical condition  Airway Management Planned: Oral ETT  Additional Equipment: Arterial line, CVP and TEE  Intra-op Plan:   Post-operative Plan: Post-operative intubation/ventilation  Informed Consent: I have reviewed the patients History and Physical, chart, labs and discussed the procedure including the risks,  benefits and alternatives for the proposed anesthesia with the patient or authorized representative who has indicated his/her understanding and acceptance.     Dental advisory given  Plan Discussed with: CRNA  Anesthesia Plan Comments:         Anesthesia Quick Evaluation

## 2022-02-25 NOTE — Progress Notes (Signed)
  Echocardiogram Echocardiogram Transesophageal has been performed.  Kyle Davidson 02/25/2022, 10:12 AM

## 2022-02-26 ENCOUNTER — Encounter (HOSPITAL_COMMUNITY): Payer: Self-pay | Admitting: Thoracic Surgery (Cardiothoracic Vascular Surgery)

## 2022-02-26 ENCOUNTER — Inpatient Hospital Stay (HOSPITAL_COMMUNITY): Payer: Medicare HMO

## 2022-02-26 LAB — CBC
HCT: 35.6 % — ABNORMAL LOW (ref 39.0–52.0)
HCT: 35.4 % — ABNORMAL LOW (ref 39.0–52.0)
Hemoglobin: 12.8 g/dL — ABNORMAL LOW (ref 13.0–17.0)
Hemoglobin: 12.2 g/dL — ABNORMAL LOW (ref 13.0–17.0)
MCH: 34.3 pg — ABNORMAL HIGH (ref 26.0–34.0)
MCH: 33.7 pg (ref 26.0–34.0)
MCHC: 36 g/dL (ref 30.0–36.0)
MCHC: 34.5 g/dL (ref 30.0–36.0)
MCV: 95.4 fL (ref 80.0–100.0)
MCV: 97.8 fL (ref 80.0–100.0)
Platelets: 98 10*3/uL — ABNORMAL LOW (ref 150–400)
Platelets: 104 10*3/uL — ABNORMAL LOW (ref 150–400)
RBC: 3.73 MIL/uL — ABNORMAL LOW (ref 4.22–5.81)
RBC: 3.62 MIL/uL — ABNORMAL LOW (ref 4.22–5.81)
RDW: 11.9 % (ref 11.5–15.5)
RDW: 12.1 % (ref 11.5–15.5)
WBC: 13.9 10*3/uL — ABNORMAL HIGH (ref 4.0–10.5)
WBC: 17.5 10*3/uL — ABNORMAL HIGH (ref 4.0–10.5)
nRBC: 0 % (ref 0.0–0.2)
nRBC: 0 % (ref 0.0–0.2)

## 2022-02-26 LAB — MAGNESIUM
Magnesium: 2.4 mg/dL (ref 1.7–2.4)
Magnesium: 2.4 mg/dL (ref 1.7–2.4)

## 2022-02-26 LAB — GLUCOSE, CAPILLARY
Glucose-Capillary: 140 mg/dL — ABNORMAL HIGH (ref 70–99)
Glucose-Capillary: 157 mg/dL — ABNORMAL HIGH (ref 70–99)
Glucose-Capillary: 157 mg/dL — ABNORMAL HIGH (ref 70–99)
Glucose-Capillary: 158 mg/dL — ABNORMAL HIGH (ref 70–99)
Glucose-Capillary: 158 mg/dL — ABNORMAL HIGH (ref 70–99)
Glucose-Capillary: 160 mg/dL — ABNORMAL HIGH (ref 70–99)
Glucose-Capillary: 169 mg/dL — ABNORMAL HIGH (ref 70–99)
Glucose-Capillary: 171 mg/dL — ABNORMAL HIGH (ref 70–99)
Glucose-Capillary: 187 mg/dL — ABNORMAL HIGH (ref 70–99)
Glucose-Capillary: 188 mg/dL — ABNORMAL HIGH (ref 70–99)
Glucose-Capillary: 199 mg/dL — ABNORMAL HIGH (ref 70–99)

## 2022-02-26 LAB — BASIC METABOLIC PANEL
Anion gap: 10 (ref 5–15)
Anion gap: 4 — ABNORMAL LOW (ref 5–15)
BUN: 11 mg/dL (ref 8–23)
BUN: 11 mg/dL (ref 8–23)
CO2: 20 mmol/L — ABNORMAL LOW (ref 22–32)
CO2: 26 mmol/L (ref 22–32)
Calcium: 8.2 mg/dL — ABNORMAL LOW (ref 8.9–10.3)
Calcium: 8.7 mg/dL — ABNORMAL LOW (ref 8.9–10.3)
Chloride: 109 mmol/L (ref 98–111)
Chloride: 109 mmol/L (ref 98–111)
Creatinine, Ser: 0.89 mg/dL (ref 0.61–1.24)
Creatinine, Ser: 0.92 mg/dL (ref 0.61–1.24)
GFR, Estimated: >60 mL/min (ref >60)
GFR, Estimated: >60 mL/min (ref >60)
Glucose, Bld: 171 mg/dL — ABNORMAL HIGH (ref 70–99)
Glucose, Bld: 164 mg/dL — ABNORMAL HIGH (ref 70–99)
Potassium: 4.3 mmol/L (ref 3.5–5.1)
Potassium: 4.3 mmol/L (ref 3.5–5.1)
Sodium: 139 mmol/L (ref 135–145)
Sodium: 139 mmol/L (ref 135–145)

## 2022-02-26 MED ORDER — SODIUM CHLORIDE 0.9 % IV SOLN
12.5000 mg | Freq: Four times a day (QID) | INTRAVENOUS | Status: DC | PRN
  Administered 2022-02-26: 12.5 mg via INTRAVENOUS
  Filled 2022-02-26 (×3): qty 0.5

## 2022-02-26 MED ORDER — ORAL CARE MOUTH RINSE
15.0000 mL | Freq: Two times a day (BID) | OROMUCOSAL | Status: DC
  Administered 2022-02-26 – 2022-03-01 (×4): 15 mL via OROMUCOSAL

## 2022-02-26 MED ORDER — INSULIN ASPART 100 UNIT/ML IJ SOLN
0.0000 [IU] | INTRAMUSCULAR | Status: DC
  Administered 2022-02-26: 4 [IU] via SUBCUTANEOUS
  Administered 2022-02-26: 2 [IU] via SUBCUTANEOUS
  Administered 2022-02-26: 4 [IU] via SUBCUTANEOUS
  Administered 2022-02-27 (×3): 2 [IU] via SUBCUTANEOUS
  Administered 2022-02-27: 4 [IU] via SUBCUTANEOUS

## 2022-02-26 MED ORDER — KETOROLAC TROMETHAMINE 15 MG/ML IJ SOLN
15.0000 mg | Freq: Four times a day (QID) | INTRAMUSCULAR | Status: AC
  Administered 2022-02-26 – 2022-02-27 (×2): 15 mg via INTRAVENOUS
  Filled 2022-02-26 (×2): qty 1

## 2022-02-26 MED ORDER — ACETAMINOPHEN 500 MG PO TABS
ORAL_TABLET | ORAL | Status: AC
  Filled 2022-02-26: qty 1

## 2022-02-26 MED ORDER — KETOROLAC TROMETHAMINE 15 MG/ML IJ SOLN: 15.0000 mg | Freq: Four times a day (QID) | INTRAMUSCULAR | Status: DC

## 2022-02-26 NOTE — Progress Notes (Addendum)
TCTS DAILY ICU PROGRESS NOTE                   [address]            [place] [address]          [phone]   1 Day Post-Op Procedure(s) (LRB): CORONARY ARTERY BYPASS GRAFTING (CABG) x4 USING LEFT INTERNAL MAMMARY ARTERY AND RIGHT GREATER SAPHENOUS VEIN (N/A) TRANSESOPHAGEAL ECHOCARDIOGRAM (TEE) (N/A) ENDOVEIN HARVEST OF GREATER SAPHENOUS VEIN (Right)  Total Length of Stay:  LOS: 1 day   Subjective: Feels fair, + NV overnight  Objective: Vital signs in last 24 hours: Temp:  [96.6 F (35.9 C)-100 F (37.8 C)] 98.6 F (37 C) (06/07 0600) Pulse Rate:  [65-87] 87 (06/07 0600) Cardiac Rhythm: Normal sinus rhythm (06/06 2000) Resp:  [10-26] 20 (06/07 0600) BP: (100-127)/(52-92) 117/69 (06/07 0100) SpO2:  [90 %-100 %] 98 % (06/07 0600) Arterial Line BP: (87-169)/(40-80) 169/50 (06/07 0600) FiO2 (%):  [40 %-50 %] 40 % (06/06 1926) Weight:  [107.8 kg] 107.8 kg (06/07 0600)  Filed Weights   02/25/22 0551 02/26/22 0600  Weight: 97.1 kg 107.8 kg    Weight change: 10.7 kg   Hemodynamic parameters for last 24 hours: CVP:  [0 mmHg-34 mmHg] 1 mmHg  Intake/Output from previous day: 06/06 0701 - 06/07 0700 In: 5169 [I.V.:3030; Blood:532; IV [id number]:1829] Out: 9371 [Urine:5695; Emesis/NG output:4; Chest Tube:430]  Intake/Output this shift: No intake/output data recorded.  Current Meds: Scheduled Meds:  acetaminophen  1,000 mg Oral Q6H   Or   acetaminophen (TYLENOL) oral liquid 160 mg/5 mL  1,000 mg Per Tube Q6H   aspirin EC  325 mg Oral Daily   Or   aspirin  324 mg Per Tube Daily   atorvastatin  80 mg Oral Daily   bisacodyl  10 mg Oral Daily   Or   bisacodyl  10 mg Rectal Daily   Chlorhexidine Gluconate Cloth  6 each Topical Daily   docusate sodium  200 mg Oral Daily   mouth rinse  15 mL Mouth Rinse BID   metoprolol tartrate  12.5 mg Oral BID   Or   metoprolol tartrate  12.5 mg Per Tube BID   [START ON 02/27/2022] pantoprazole  40 mg Oral Daily    sodium chloride flush  3 mL Intravenous Q12H   Continuous Infusions:  sodium chloride 20 mL/hr at 02/26/22 0500   sodium chloride     sodium chloride      ceFAZolin (ANCEF) IV 2 g (02/26/22 0504)   dexmedetomidine (PRECEDEX) IV infusion Stopped (02/25/22 1626)   insulin 0.9 Units/hr (02/26/22 0500)   lactated ringers     lactated ringers 50 mL/hr at 02/26/22 0500   niCARDipine     nitroGLYCERIN     norepinephrine (LEVOPHED) Adult infusion     phenylephrine (NEO-SYNEPHRINE) Adult infusion 25 mcg/min (02/26/22 0500)   promethazine (PHENERGAN) injection (IM or IVPB) Stopped (02/26/22 0246)   PRN Meds:.sodium chloride, dextrose, fentaNYL (SUBLIMAZE) injection, metoprolol tartrate, midazolam, ondansetron (ZOFRAN) IV, promethazine (PHENERGAN) injection (IM or IVPB), sodium chloride flush, traMADol  General appearance: alert, cooperative, and no distress Heart: regular rate and rhythm and + rub with chest tube in place Lungs: clear anteriorly Abdomen: benign Extremities: no edema Wound: dressings intact  Lab Results: CBC: Recent Labs    02/25/22 1825 02/25/22 1953 02/26/22 0141  WBC 13.3*  --  13.9*  HGB 13.4 12.9* 12.8*  HCT 37.8* 38.0* 35.6*  PLT 112*  --  98*   BMET:  Recent Labs    02/25/22 1825 02/25/22 1953 02/26/22 0141  NA 142 145 139  K 3.9 3.9 4.3  CL 115*  --  109  CO2 20*  --  20*  GLUCOSE 147*  --  171*  BUN 10  --  11  CREATININE 0.84  --  0.89  CALCIUM 7.6*  --  8.2*    CMET: Lab Results  Component Value Date   WBC 13.9 (H) 02/26/2022   HGB 12.8 (L) 02/26/2022   HCT 35.6 (L) 02/26/2022   PLT 98 (L) 02/26/2022   GLUCOSE 171 (H) 02/26/2022   CHOL 201 (H) 02/01/2014   TRIG 113.0 02/01/2014   HDL 28.90 (L) 02/01/2014   LDLCALC 150 (H) 02/01/2014   ALT 18 02/21/2022   AST 25 02/21/2022   NA 139 02/26/2022   K 4.3 02/26/2022   CL 109 02/26/2022   CREATININE 0.89 02/26/2022   BUN 11 02/26/2022   CO2 20 (L) 02/26/2022   TSH 2.410 05/05/2018    INR 1.6 (H) 02/25/2022   HGBA1C 5.6 02/21/2022      PT/INR:  Recent Labs    02/25/22 1302  LABPROT 18.6*  INR 1.6*   Radiology: [facility] Chest Port 1 View  Result Date: 02/26/2022 CLINICAL DATA:  Status post CABG yesterday. EXAM: PORTABLE CHEST 1 VIEW COMPARISON:  February 25, 2022 FINDINGS: The heart size and mediastinal contours are stable. Right jugular venous catheter is identified unchanged. Left chest tube is stable. Minor atelectasis of bilateral lung bases are noted. The visualized skeletal structures are stable. IMPRESSION: Minor atelectasis of bilateral lung bases. No pneumothorax. Electronically Signed   By: Abelardo Diesel M.D.   On: 02/26/2022 07:18   DG CHEST PORT 1 VIEW  Result Date: 02/25/2022 CLINICAL DATA:  [id number].  Short of breath EXAM: PORTABLE CHEST 1 VIEW COMPARISON:  Chest x-ray 08/12/2018, chest x-ray 02/25/2022 1 p.m., chest x-ray 02/21/2022 FINDINGS: Right internal jugular central venous catheter with tip overlying the expected region of the distal superior vena cava. Stable mediastinal surgical drain. Stable left chest tube. Interval removal of an endotracheal tube and enteric tube. The heart and mediastinal contours are unchanged. Left base atelectasis. No pulmonary edema. Persistent trace left pleural effusion. No pneumothorax. No acute osseous abnormality.  Sternotomy wires are stable. IMPRESSION: 1. Persistent trace left pleural effusion with left base atelectasis. Superimposed pulmonary infection/inflammation not excluded. 2. Interval removal of an endotracheal tube and enteric tube. 3. Other lines and tubes are stable. Electronically Signed   By: Iven Finn M.D.   On: 02/25/2022 21:46   DG Chest Port 1 View  Result Date: 02/25/2022 CLINICAL DATA:  Post cardiac arrest. EXAM: PORTABLE CHEST 1 VIEW COMPARISON:  June 2 ,2023. FINDINGS: Right IJ central venous catheter with tip overlying the SVC. Endotracheal tube with tip projecting over the distal thoracic trachea. Enteric  tube courses below the diaphragm with tip and side port projecting over the stomach. Prior median sternotomy and CABG. Enlarged cardiac silhouette with central vascular prominence. Linear opacities in bilateral lungs favor atelectasis. Probable small left pleural effusion with adjacent atelectasis. Anterior cervical fusion hardware. IMPRESSION: Enlarged cardiac silhouette with central vascular prominence without overt pulmonary edema. Small left pleural effusion with adjacent atelectasis. Endotracheal tube tip projects over the distal thoracic trachea. Right IJ CVC with tip overlying the SVC common no visible pneumothorax. Electronically Signed   By: Dahlia Bailiff M.D.   On: 02/25/2022 13:12     Assessment/Plan: S/P  Procedure(s) (LRB): CORONARY ARTERY BYPASS GRAFTING (CABG) x4 USING LEFT INTERNAL MAMMARY ARTERY AND RIGHT GREATER SAPHENOUS VEIN (N/A) TRANSESOPHAGEAL ECHOCARDIOGRAM (TEE) (N/A) ENDOVEIN HARVEST OF GREATER SAPHENOUS VEIN (Right) POD#1  1 afeb, VSS off neo, + N/V overnight, sinus rhythm 2 sats ok  3 excellent UOP, weight up 10 kg if accurate 4 normal renal fxn 5 minor reactive leukocytosis 6 CBG ok , transition off insulin gtt 7 thrombocytopenia, platelets 98 K, monitor 8 expected ABLA , minor 9  CXR minor atx 10 CT 430 cc but only 80 overnight, poss d/c soon if wires able to be removed 11 routine pulm hygiene and rehab     Kyle Davidson 02/26/2022 8:04 AM   Agree with above IS, ambulation POD 1 transition  Shresta Risden O Sakiyah Shur

## 2022-02-26 NOTE — Progress Notes (Signed)
CT Surgery  C/o incisional pain Pm labs ok Nsr  Blood pressure 107/71, pulse 84, temperature 98.1 F (36.7 C), temperature source Oral, resp. rate 13, height '6\' 3"'$  (1.905 m), weight 107.8 kg, SpO2 96 %.   Will dose with short course toradol

## 2022-02-26 NOTE — Anesthesia Postprocedure Evaluation (Signed)
Anesthesia Post Note  Patient: Kyle Davidson  Procedure(s) Performed: CORONARY ARTERY BYPASS GRAFTING (CABG) x4 USING LEFT INTERNAL MAMMARY ARTERY AND RIGHT GREATER SAPHENOUS VEIN (Chest) TRANSESOPHAGEAL ECHOCARDIOGRAM (TEE) ENDOVEIN HARVEST OF GREATER SAPHENOUS VEIN (Right)     Patient location during evaluation: SICU Anesthesia Type: General Level of consciousness: sedated Pain management: pain level controlled Vital Signs Assessment: post-procedure vital signs reviewed and stable Respiratory status: patient remains intubated per anesthesia plan Cardiovascular status: stable Postop Assessment: no apparent nausea or vomiting Anesthetic complications: no   No notable events documented.  Last Vitals:  Vitals:   02/26/22 1200 02/26/22 1300  BP: 114/61 114/62  Pulse: 79 83  Resp: 16 17  Temp: 36.8 C   SpO2: 98% 96%    Last Pain:  Vitals:   02/26/22 1200  TempSrc: Oral  PainSc: 0-No pain                 Tiajuana Amass

## 2022-02-26 NOTE — Progress Notes (Signed)
  Transition of Care [facility]) Screening Note   Patient Details  Name: Kyle Davidson Date of Birth: Sep 17, 1951   Transition of Care [facility]) CM/SW Contact:    Milas Gain, [place] Phone Number: 02/26/2022, 3:14 PM    Transition of Care Department [facility]) has reviewed patient and no TOC needs have been identified at this time. We will continue to monitor patient advancement through interdisciplinary progression rounds. If new patient transition needs arise, please place a TOC consult.

## 2022-02-27 ENCOUNTER — Inpatient Hospital Stay (HOSPITAL_COMMUNITY): Payer: Medicare HMO

## 2022-02-27 LAB — GLUCOSE, CAPILLARY
Glucose-Capillary: 136 mg/dL — ABNORMAL HIGH (ref 70–99)
Glucose-Capillary: 173 mg/dL — ABNORMAL HIGH (ref 70–99)
Glucose-Capillary: 132 mg/dL — ABNORMAL HIGH (ref 70–99)
Glucose-Capillary: 146 mg/dL — ABNORMAL HIGH (ref 70–99)

## 2022-02-27 LAB — CBC
HCT: 33.7 % — ABNORMAL LOW (ref 39.0–52.0)
Hemoglobin: 11.3 g/dL — ABNORMAL LOW (ref 13.0–17.0)
MCH: 32.9 pg (ref 26.0–34.0)
MCHC: 33.5 g/dL (ref 30.0–36.0)
MCV: 98.3 fL (ref 80.0–100.0)
Platelets: 95 10*3/uL — ABNORMAL LOW (ref 150–400)
RBC: 3.43 MIL/uL — ABNORMAL LOW (ref 4.22–5.81)
RDW: 12.3 % (ref 11.5–15.5)
WBC: 14.8 10*3/uL — ABNORMAL HIGH (ref 4.0–10.5)
nRBC: 0 % (ref 0.0–0.2)

## 2022-02-27 LAB — BASIC METABOLIC PANEL
Anion gap: 7 (ref 5–15)
BUN: 14 mg/dL (ref 8–23)
CO2: 26 mmol/L (ref 22–32)
Calcium: 8.2 mg/dL — ABNORMAL LOW (ref 8.9–10.3)
Chloride: 105 mmol/L (ref 98–111)
Creatinine, Ser: 0.76 mg/dL (ref 0.61–1.24)
GFR, Estimated: >60 mL/min (ref >60)
Glucose, Bld: 150 mg/dL — ABNORMAL HIGH (ref 70–99)
Potassium: 4 mmol/L (ref 3.5–5.1)
Sodium: 138 mmol/L (ref 135–145)

## 2022-02-27 LAB — ECHO INTRAOPERATIVE TEE
Height: 75 in
Weight: 3424 oz

## 2022-02-27 MED ORDER — SODIUM CHLORIDE 0.9 % IV SOLN: 250.0000 mL | INTRAVENOUS | Status: DC | PRN

## 2022-02-27 MED ORDER — SODIUM CHLORIDE 0.9% FLUSH
3.0000 mL | Freq: Two times a day (BID) | INTRAVENOUS | Status: DC
  Administered 2022-02-28 – 2022-03-01 (×2): 3 mL via INTRAVENOUS

## 2022-02-27 MED ORDER — SODIUM CHLORIDE 0.9% FLUSH: 3.0000 mL | INTRAVENOUS | Status: DC | PRN

## 2022-02-27 MED ORDER — INSULIN ASPART 100 UNIT/ML IJ SOLN
0.0000 [IU] | Freq: Three times a day (TID) | INTRAMUSCULAR | Status: DC
  Administered 2022-02-28 – 2022-03-01 (×3): 2 [IU] via SUBCUTANEOUS

## 2022-02-27 MED ORDER — FUROSEMIDE 40 MG PO TABS
40.0000 mg | ORAL_TABLET | Freq: Every day | ORAL | Status: DC
  Administered 2022-02-28 – 2022-03-01 (×2): 40 mg via ORAL
  Filled 2022-02-27 (×2): qty 1

## 2022-02-27 MED ORDER — ~~LOC~~ CARDIAC SURGERY, PATIENT & FAMILY EDUCATION: Freq: Once | Status: AC

## 2022-02-27 MED ORDER — POTASSIUM CHLORIDE CRYS ER 20 MEQ PO TBCR
40.0000 meq | EXTENDED_RELEASE_TABLET | Freq: Every day | ORAL | Status: DC
  Administered 2022-02-27 – 2022-03-01 (×3): 40 meq via ORAL
  Filled 2022-02-27 (×3): qty 2

## 2022-02-27 MED ORDER — FUROSEMIDE 10 MG/ML IJ SOLN
40.0000 mg | Freq: Once | INTRAMUSCULAR | Status: AC
  Administered 2022-02-27: 40 mg via INTRAVENOUS
  Filled 2022-02-27: qty 4

## 2022-02-27 MED FILL — Electrolyte-R (PH 7.4) Solution: INTRAVENOUS | Qty: 6000 | Status: AC

## 2022-02-27 MED FILL — Sodium Chloride IV Soln 0.9%: INTRAVENOUS | Qty: 2000 | Status: AC

## 2022-02-27 MED FILL — Sodium Bicarbonate IV Soln 8.4%: INTRAVENOUS | Qty: 50 | Status: AC

## 2022-02-27 MED FILL — Potassium Chloride Inj 2 mEq/ML: INTRAVENOUS | Qty: 40 | Status: AC

## 2022-02-27 MED FILL — Heparin Sodium (Porcine) Inj 1000 Unit/ML: Qty: 1000 | Status: AC

## 2022-02-27 MED FILL — Calcium Chloride Inj 10%: INTRAVENOUS | Qty: 10 | Status: AC

## 2022-02-27 MED FILL — Mannitol IV Soln 20%: INTRAVENOUS | Qty: 500 | Status: AC

## 2022-02-27 MED FILL — Lidocaine HCl Local Preservative Free (PF) Inj 2%: INTRAMUSCULAR | Qty: 15 | Status: AC

## 2022-02-27 MED FILL — Heparin Sodium (Porcine) Inj 1000 Unit/ML: INTRAMUSCULAR | Qty: 10 | Status: AC

## 2022-02-27 NOTE — Progress Notes (Signed)
CARDIAC REHAB PHASE I   PRE:  Rate/Rhythm: 86 NSR  BP:  Sitting: 140/81      SaO2: 93  MODE:  Ambulation: 430 ft   POST:  Rate/Rhythm: 96 NSR  BP:  Sitting: 137/80      SaO2: 94  Pt seen from 1453-1517, pt was ambulated through hallways with RW with no assistance. Pt gait is slow and steady when using RW. Pt did not complain of SOB or angina during ambulation. Pt was returned to chair.   Kyle Davidson  3:16 PM 02/27/2022

## 2022-02-27 NOTE — Progress Notes (Addendum)
[address]       [place] [address]             [phone]      2 Days Post-Op Procedure(s) (LRB): CORONARY ARTERY BYPASS GRAFTING (CABG) x4 USING LEFT INTERNAL MAMMARY ARTERY AND RIGHT GREATER SAPHENOUS VEIN (N/A) TRANSESOPHAGEAL ECHOCARDIOGRAM (TEE) (N/A) ENDOVEIN HARVEST OF GREATER SAPHENOUS VEIN (Right) Subjective: Feeling better  Objective: Vital signs in last 24 hours: Temp:  [97.7 F (36.5 C)-99.1 F (37.3 C)] 97.7 F (36.5 C) (06/08 0318) Pulse Rate:  [60-91] 79 (06/08 0700) Cardiac Rhythm: Normal sinus rhythm (06/07 2000) Resp:  [12-25] 15 (06/08 0700) BP: (100-138)/(57-80) 124/76 (06/08 0700) SpO2:  [93 %-98 %] 93 % (06/08 0700) Arterial Line BP: (148-153)/(50-53) 153/53 (06/07 1000) Weight:  [100.6 kg] 100.6 kg (06/08 0500)  Hemodynamic parameters for last 24 hours: CVP:  [3 mmHg-7 mmHg] 7 mmHg  Intake/Output from previous day: 06/07 0701 - 06/08 0700 In: 2114 [P.O.:1200; I.V.:597.6; IV Piggyback:316.4] Out: 3135 [Urine:2925; Chest Tube:210] Intake/Output this shift: Total I/O In: -  Out: 300 [Urine:300]  General appearance: alert, cooperative, and no distress Heart: regular rate and rhythm Lungs: clear to auscultation bilaterally Abdomen: benign Extremities: no edema Wound: dressings CDI  Lab Results: Recent Labs    02/26/22 1542 02/27/22 0316  WBC 17.5* 14.8*  HGB 12.2* 11.3*  HCT 35.4* 33.7*  PLT 104* 95*   BMET:  Recent Labs    02/26/22 1542 02/27/22 0316  NA 139 138  K 4.3 4.0  CL 109 105  CO2 26 26  GLUCOSE 164* 150*  BUN 11 14  CREATININE 0.92 0.76  CALCIUM 8.7* 8.2*    PT/INR:  Recent Labs    02/25/22 1302  LABPROT 18.6*  INR 1.6*   ABG    Component Value Date/Time   PHART 7.310 (L) 02/25/2022 1953   HCO3 21.4 02/25/2022 1953   TCO2 23 02/25/2022 1953   ACIDBASEDEF 5.0 (H) 02/25/2022 1953   O2SAT 98 02/25/2022 1953   CBG (last 3)  Recent Labs    02/26/22 2321 02/27/22 0312  02/27/22 0725  GLUCAP 157* 136* 173*    Meds Scheduled Meds:  acetaminophen  1,000 mg Oral Q6H   Or   acetaminophen (TYLENOL) oral liquid 160 mg/5 mL  1,000 mg Per Tube Q6H   aspirin EC  325 mg Oral Daily   Or   aspirin  324 mg Per Tube Daily   atorvastatin  80 mg Oral Daily   bisacodyl  10 mg Oral Daily   Or   bisacodyl  10 mg Rectal Daily   Chlorhexidine Gluconate Cloth  6 each Topical Daily   docusate sodium  200 mg Oral Daily   furosemide  40 mg Intravenous Once   [START ON 02/28/2022] furosemide  40 mg Oral Daily   insulin aspart  0-24 Units Subcutaneous Q4H   ketorolac  15 mg Intravenous Q6H   mouth rinse  15 mL Mouth Rinse BID   metoprolol tartrate  12.5 mg Oral BID   Or   metoprolol tartrate  12.5 mg Per Tube BID   pantoprazole  40 mg Oral Daily   potassium chloride  40 mEq Oral Daily   sodium chloride flush  3 mL Intravenous Q12H   Continuous Infusions:  sodium chloride Stopped (02/26/22 2135)   sodium chloride     sodium chloride     insulin Stopped (02/26/22 1128)   lactated ringers     lactated  ringers 20 mL/hr at 02/27/22 0700   promethazine (PHENERGAN) injection (IM or IVPB) Stopped (02/26/22 0246)   PRN Meds:.sodium chloride, dextrose, fentaNYL (SUBLIMAZE) injection, metoprolol tartrate, midazolam, ondansetron (ZOFRAN) IV, promethazine (PHENERGAN) injection (IM or IVPB), sodium chloride flush, traMADol  Xrays DG Chest Port 1 View  Result Date: 02/26/2022 CLINICAL DATA:  Status post CABG yesterday. EXAM: PORTABLE CHEST 1 VIEW COMPARISON:  February 25, 2022 FINDINGS: The heart size and mediastinal contours are stable. Right jugular venous catheter is identified unchanged. Left chest tube is stable. Minor atelectasis of bilateral lung bases are noted. The visualized skeletal structures are stable. IMPRESSION: Minor atelectasis of bilateral lung bases. No pneumothorax. Electronically Signed   By: Abelardo Diesel M.D.   On: 02/26/2022 07:18   DG CHEST PORT 1  VIEW  Result Date: 02/25/2022 CLINICAL DATA:  [id number].  Short of breath EXAM: PORTABLE CHEST 1 VIEW COMPARISON:  Chest x-ray 08/12/2018, chest x-ray 02/25/2022 1 p.m., chest x-ray 02/21/2022 FINDINGS: Right internal jugular central venous catheter with tip overlying the expected region of the distal superior vena cava. Stable mediastinal surgical drain. Stable left chest tube. Interval removal of an endotracheal tube and enteric tube. The heart and mediastinal contours are unchanged. Left base atelectasis. No pulmonary edema. Persistent trace left pleural effusion. No pneumothorax. No acute osseous abnormality.  Sternotomy wires are stable. IMPRESSION: 1. Persistent trace left pleural effusion with left base atelectasis. Superimposed pulmonary infection/inflammation not excluded. 2. Interval removal of an endotracheal tube and enteric tube. 3. Other lines and tubes are stable. Electronically Signed   By: Iven Finn M.D.   On: 02/25/2022 21:46   DG Chest Port 1 View  Result Date: 02/25/2022 CLINICAL DATA:  Post cardiac arrest. EXAM: PORTABLE CHEST 1 VIEW COMPARISON:  June 2 ,2023. FINDINGS: Right IJ central venous catheter with tip overlying the SVC. Endotracheal tube with tip projecting over the distal thoracic trachea. Enteric tube courses below the diaphragm with tip and side port projecting over the stomach. Prior median sternotomy and CABG. Enlarged cardiac silhouette with central vascular prominence. Linear opacities in bilateral lungs favor atelectasis. Probable small left pleural effusion with adjacent atelectasis. Anterior cervical fusion hardware. IMPRESSION: Enlarged cardiac silhouette with central vascular prominence without overt pulmonary edema. Small left pleural effusion with adjacent atelectasis. Endotracheal tube tip projects over the distal thoracic trachea. Right IJ CVC with tip overlying the SVC common no visible pneumothorax. Electronically Signed   By: Dahlia Bailiff M.D.   On:  02/25/2022 13:12    Assessment/Plan: S/P Procedure(s) (LRB): CORONARY ARTERY BYPASS GRAFTING (CABG) x4 USING LEFT INTERNAL MAMMARY ARTERY AND RIGHT GREATER SAPHENOUS VEIN (N/A) TRANSESOPHAGEAL ECHOCARDIOGRAM (TEE) (N/A) ENDOVEIN HARVEST OF GREATER SAPHENOUS VEIN (Right) POD#2  1 afeb, VSS, no gtts, SR/ST 2 sats good on 2 liters 3 good UOP- lasix ordered, wt up about 3 kg 4 normal renal fxn 5 leukocytosis trending lower 6 thrombocytopenia is pretty stable 7 expected ABLA - equalibrating, not in transfusion threshold 8 CXR - clear lung fields 9 routine pulm hygiene/rehab 10 tx to 4 e      LOS: 2 days    John Giovanni PA-C Pager [phone] 02/27/2022    Agree with above Will diurese Will transfer to floor  [place]

## 2022-02-28 LAB — GLUCOSE, CAPILLARY
Glucose-Capillary: 149 mg/dL — ABNORMAL HIGH (ref 70–99)
Glucose-Capillary: 117 mg/dL — ABNORMAL HIGH (ref 70–99)
Glucose-Capillary: 149 mg/dL — ABNORMAL HIGH (ref 70–99)
Glucose-Capillary: 145 mg/dL — ABNORMAL HIGH (ref 70–99)

## 2022-02-28 LAB — BASIC METABOLIC PANEL
Anion gap: 6 (ref 5–15)
BUN: 11 mg/dL (ref 8–23)
CO2: 25 mmol/L (ref 22–32)
Calcium: 8.3 mg/dL — ABNORMAL LOW (ref 8.9–10.3)
Chloride: 107 mmol/L (ref 98–111)
Creatinine, Ser: 0.66 mg/dL (ref 0.61–1.24)
GFR, Estimated: >60 mL/min (ref >60)
Glucose, Bld: 162 mg/dL — ABNORMAL HIGH (ref 70–99)
Potassium: 3.6 mmol/L (ref 3.5–5.1)
Sodium: 138 mmol/L (ref 135–145)

## 2022-02-28 LAB — CBC
HCT: 34.5 % — ABNORMAL LOW (ref 39.0–52.0)
Hemoglobin: 11.8 g/dL — ABNORMAL LOW (ref 13.0–17.0)
MCH: 33.4 pg (ref 26.0–34.0)
MCHC: 34.2 g/dL (ref 30.0–36.0)
MCV: 97.7 fL (ref 80.0–100.0)
Platelets: 98 10*3/uL — ABNORMAL LOW (ref 150–400)
RBC: 3.53 MIL/uL — ABNORMAL LOW (ref 4.22–5.81)
RDW: 12.2 % (ref 11.5–15.5)
WBC: 11.6 10*3/uL — ABNORMAL HIGH (ref 4.0–10.5)
nRBC: 0 % (ref 0.0–0.2)

## 2022-02-28 MED ORDER — METOPROLOL TARTRATE 25 MG PO TABS
25.0000 mg | ORAL_TABLET | Freq: Two times a day (BID) | ORAL | Status: DC
  Administered 2022-02-28 – 2022-03-01 (×3): 25 mg via ORAL
  Filled 2022-02-28 (×3): qty 1

## 2022-02-28 MED ORDER — METOPROLOL TARTRATE 25 MG/10 ML ORAL SUSPENSION
12.5000 mg | Freq: Two times a day (BID) | ORAL | Status: DC
  Filled 2022-02-28: qty 5

## 2022-02-28 NOTE — Progress Notes (Addendum)
   02/28/22 0530  Mobility  Activity Stood at bedside;Ambulated with assistance in hallway;Ambulated with assistance to bathroom  Range of Motion/Exercises Active;All extremities  Level of Assistance Standby assist, set-up cues, supervision of patient - no hands on  Distance Ambulated (ft) 500 ft  Activity Response Tolerated well    Pt eager to walk this AM- he walked independently from his room to room 1 and back w/ rolling walker. Tolerated well. HR 100-110s.

## 2022-02-28 NOTE — Progress Notes (Addendum)
[address]       [place] [address]             [phone]      3 Days Post-Op Procedure(s) (LRB): CORONARY ARTERY BYPASS GRAFTING (CABG) x4 USING LEFT INTERNAL MAMMARY ARTERY AND RIGHT GREATER SAPHENOUS VEIN (N/A) TRANSESOPHAGEAL ECHOCARDIOGRAM (TEE) (N/A) ENDOVEIN HARVEST OF GREATER SAPHENOUS VEIN (Right) Subjective: Some sternotomy discomfort but overall feels better, no BM, + flatus  Objective: Vital signs in last 24 hours: Temp:  [97.6 F (36.4 C)-98.3 F (36.8 C)] 98.3 F (36.8 C) (06/09 0524) Pulse Rate:  [70-92] 77 (06/09 0524) Cardiac Rhythm: Normal sinus rhythm (06/08 1900) Resp:  [13-27] 20 (06/09 0524) BP: (106-140)/(53-81) 128/72 (06/09 0524) SpO2:  [91 %-97 %] 95 % (06/09 0524) FiO2 (%):  [21 %] 21 % (06/08 1054) Weight:  [98.1 kg] 98.1 kg (06/09 0535)  Hemodynamic parameters for last 24 hours:    Intake/Output from previous day: 06/08 0701 - 06/09 0700 In: 359.8 [P.O.:240; I.V.:36; IV Piggyback:83.8] Out: 1770 [Urine:1770] Intake/Output this shift: No intake/output data recorded.  General appearance: alert, cooperative, and no distress Heart: regular rate and rhythm Lungs: min dim left base Abdomen: benign, + BS Extremities: no edema Wound: evh site ok, chest dressing ok  Lab Results: Recent Labs    02/27/22 0316 02/28/22 0301  WBC 14.8* 11.6*  HGB 11.3* 11.8*  HCT 33.7* 34.5*  PLT 95* 98*   BMET:  Recent Labs    02/27/22 0316 02/28/22 0301  NA 138 138  K 4.0 3.6  CL 105 107  CO2 26 25  GLUCOSE 150* 162*  BUN 14 11  CREATININE 0.76 0.66  CALCIUM 8.2* 8.3*    PT/INR:  Recent Labs    02/25/22 1302  LABPROT 18.6*  INR 1.6*   ABG    Component Value Date/Time   PHART 7.310 (L) 02/25/2022 1953   HCO3 21.4 02/25/2022 1953   TCO2 23 02/25/2022 1953   ACIDBASEDEF 5.0 (H) 02/25/2022 1953   O2SAT 98 02/25/2022 1953   CBG (last 3)  Recent Labs    02/27/22 1135 02/27/22 2123 02/28/22 0610  GLUCAP  132* 146* 149*    Meds Scheduled Meds:  acetaminophen  1,000 mg Oral Q6H   Or   acetaminophen (TYLENOL) oral liquid 160 mg/5 mL  1,000 mg Per Tube Q6H   aspirin EC  325 mg Oral Daily   Or   aspirin  324 mg Per Tube Daily   atorvastatin  80 mg Oral Daily   bisacodyl  10 mg Oral Daily   Or   bisacodyl  10 mg Rectal Daily   Chlorhexidine Gluconate Cloth  6 each Topical Daily   docusate sodium  200 mg Oral Daily   furosemide  40 mg Oral Daily   insulin aspart  0-24 Units Subcutaneous TID WC   mouth rinse  15 mL Mouth Rinse BID   metoprolol tartrate  12.5 mg Oral BID   Or   metoprolol tartrate  12.5 mg Per Tube BID   pantoprazole  40 mg Oral Daily   potassium chloride  40 mEq Oral Daily   sodium chloride flush  3 mL Intravenous Q12H   sodium chloride flush  3 mL Intravenous Q12H   Continuous Infusions:  sodium chloride Stopped (02/26/22 2135)   sodium chloride     sodium chloride     sodium chloride     insulin Stopped (02/26/22 1128)   lactated ringers  lactated ringers Stopped (02/27/22 0847)   promethazine (PHENERGAN) injection (IM or IVPB) Stopped (02/26/22 0246)   PRN Meds:.sodium chloride, sodium chloride, dextrose, fentaNYL (SUBLIMAZE) injection, metoprolol tartrate, midazolam, ondansetron (ZOFRAN) IV, promethazine (PHENERGAN) injection (IM or IVPB), sodium chloride flush, sodium chloride flush, traMADol  Xrays DG Chest Port 1 View  Result Date: 02/27/2022 CLINICAL DATA:  [id number], pneumothorax with shortness of breath. History of CABG x4 on 02/25/2022 EXAM: PORTABLE CHEST 1 VIEW COMPARISON:  Chest x-ray from yesterday FINDINGS: Again seen are the right transjugular central venous catheter, sternotomy wires, left chest tube and mediastinal tube are stable. Cardiomegaly. There is some atelectasis at the left lung base with resolution of the atelectasis at the right lung base. No pneumothorax. IMPRESSION: There has been interval resolution of the atelectasis at the right  lung base. Otherwise, there has been no significant interval change. Electronically Signed   By: Frazier Richards M.D.   On: 02/27/2022 08:15    Assessment/Plan: S/P Procedure(s) (LRB): CORONARY ARTERY BYPASS GRAFTING (CABG) x4 USING LEFT INTERNAL MAMMARY ARTERY AND RIGHT GREATER SAPHENOUS VEIN (N/A) TRANSESOPHAGEAL ECHOCARDIOGRAM (TEE) (N/A) ENDOVEIN HARVEST OF GREATER SAPHENOUS VEIN (Right)   POD#3 1 afeb, VSS, s BP 100's-140's, SR/ST- will increase beta blocker 2 sats good on RA 3 voiding well, weight almost at preop 4 normal renal fxn 5 WBC conts to trend lower 6 anemia is stable 7 thrombocytopenia is stable 8 BS adeq control- no preop meds 9 cont rehab and pulm hygiene 10 prob home 1-2 days        LOS: 3 days    John Giovanni PA-C Pager [phone] 02/28/2022   Agree with above. Overall doing well. Home tomorrow.  Evart Mcdonnell Bary Leriche

## 2022-02-28 NOTE — Progress Notes (Signed)
Seen pt from 1410-1447, pt was educated with wife at bedside. Pt was educated on sternal precautions, restrictions, IS use, diet, ex guidelines, and CRPII. Pt was referred to CRPII  in [place].

## 2022-02-28 NOTE — Progress Notes (Signed)
Seen pt from [phone], pt decline ambulation due to feeling unwell. Offered education but pt would like to wait until daughter comes.

## 2022-02-28 NOTE — Care Management Important Message (Signed)
Important Message  Patient Details  Name: Kyle Davidson MRN: [id number] Date of Birth: 02/19/1951   Medicare Important Message Given:  Yes     Shelda Altes 02/28/2022, 9:19 AM

## 2022-03-01 LAB — GLUCOSE, CAPILLARY
Glucose-Capillary: 143 mg/dL — ABNORMAL HIGH (ref 70–99)
Glucose-Capillary: 136 mg/dL — ABNORMAL HIGH (ref 70–99)

## 2022-03-01 MED ORDER — POTASSIUM CHLORIDE CRYS ER 20 MEQ PO TBCR: 20.0000 meq | EXTENDED_RELEASE_TABLET | Freq: Every day | ORAL | 0 refills | Status: DC

## 2022-03-01 MED ORDER — SORBITOL 70 % SOLN
30.0000 mL | Freq: Once | Status: AC
Start: 2022-03-01 — End: 2022-03-01
  Administered 2022-03-01: 30 mL via ORAL
  Filled 2022-03-01: qty 30

## 2022-03-01 MED ORDER — METOPROLOL TARTRATE 25 MG PO TABS: 25.0000 mg | ORAL_TABLET | Freq: Two times a day (BID) | ORAL | 11 refills | Status: DC

## 2022-03-01 MED ORDER — ATORVASTATIN CALCIUM 80 MG PO TABS
80.0000 mg | ORAL_TABLET | Freq: Every day | ORAL | 2 refills | Status: DC
Start: 2022-03-01 — End: 2022-12-01

## 2022-03-01 MED ORDER — TRAMADOL HCL 50 MG PO TABS: 50.0000 mg | ORAL_TABLET | Freq: Four times a day (QID) | ORAL | 0 refills | Status: DC | PRN

## 2022-03-01 MED ORDER — ASPIRIN 325 MG PO TBEC: 325.0000 mg | DELAYED_RELEASE_TABLET | Freq: Every day | ORAL | Status: DC

## 2022-03-01 MED ORDER — FUROSEMIDE 40 MG PO TABS: 40.0000 mg | ORAL_TABLET | Freq: Every day | ORAL | 0 refills | Status: DC

## 2022-03-01 NOTE — Progress Notes (Signed)
   03/01/22 0557  Mobility  Activity Ambulated independently in hallway  Range of Motion/Exercises Active;All extremities  Level of Assistance Independent  Distance Ambulated (ft) 500 ft  Activity Response Tolerated well

## 2022-03-01 NOTE — Progress Notes (Addendum)
      [address]       [place] [address]             [phone]        4 Days Post-Op Procedure(s) (LRB): CORONARY ARTERY BYPASS GRAFTING (CABG) x4 USING LEFT INTERNAL MAMMARY ARTERY AND RIGHT GREATER SAPHENOUS VEIN (N/A) TRANSESOPHAGEAL ECHOCARDIOGRAM (TEE) (N/A) ENDOVEIN HARVEST OF GREATER SAPHENOUS VEIN (Right)  Subjective: Patient still has not had bowel movement yet. He is not sleeping. He really wants to go home.  Objective: Vital signs in last 24 hours: Temp:  [98 F (36.7 C)-99.3 F (37.4 C)] 98 F (36.7 C) (06/10 0718) Pulse Rate:  [87-101] 100 (06/10 0718) Cardiac Rhythm: Normal sinus rhythm (06/09 1909) Resp:  [18-24] 24 (06/10 0718) BP: (113-145)/(68-85) 132/76 (06/10 0718) SpO2:  [92 %-97 %] 95 % (06/10 0718) Weight:  [95.7 kg] 95.7 kg (06/10 0300)  Pre op weight 97.1 kg Current Weight  03/01/22 95.7 kg      Intake/Output from previous day: 06/09 0701 - 06/10 0700 In: 120 [P.O.:120] Out: 2200 [Urine:2200]   Physical Exam:  Cardiovascular: Tachycardic Pulmonary: Clear to auscultation bilaterally Abdomen: Soft, non tender, bowel sounds present. Extremities: No  lower extremity edema. Wounds: Clean and dry.  No erythema or signs of infection.  Lab Results: CBC: Recent Labs    02/27/22 0316 02/28/22 0301  WBC 14.8* 11.6*  HGB 11.3* 11.8*  HCT 33.7* 34.5*  PLT 95* 98*   BMET:  Recent Labs    02/27/22 0316 02/28/22 0301  NA 138 138  K 4.0 3.6  CL 105 107  CO2 26 25  GLUCOSE 150* 162*  BUN 14 11  CREATININE 0.76 0.66  CALCIUM 8.2* 8.3*    PT/INR:  Lab Results  Component Value Date   INR 1.6 (H) 02/25/2022   INR 1.1 02/21/2022   INR 1.0 01/09/2020   ABG:  INR: Will add last result for INR, ABG once components are confirmed Will add last 4 CBG results once components are confirmed  Assessment/Plan:  1. CV - ST. On Lopressor 12.5 mg bid. Will increase Lopressor 25 mg bid as taken prior to surgery. 2.   Pulmonary - On room air. Encourage incentive spirometer. 3. Volume Overload - On Lasix 40 mg daily 4.  Expected post op acute blood loss anemia - Last H and H stable at 11.8 and 34.5 5. History of pre diabetes-CBGs 149/145/143. Pre op HGA1C 5.6. He may follow up with medical doctor after discharge for further surveillance 6. Thrombocytopenia-last platelets 98,000 7. LOC constipation 8. Will discuss disposition with Dr. Nada Libman ZimmermanPA-C 8:06 AM   Patient seen and examined, agree with above Had a BM a few minutes ago Home later today  Evanee Lubrano C. Roxan Hockey, MD [facility] [phone]

## 2022-03-04 ENCOUNTER — Ambulatory Visit: Payer: Medicare HMO | Admitting: Cardiovascular Disease

## 2022-03-06 ENCOUNTER — Telehealth (HOSPITAL_COMMUNITY): Payer: Self-pay

## 2022-03-06 NOTE — Telephone Encounter (Signed)
Called patient to see if he is interested in the Cardiac Rehab Program. Patient expressed interest. Explained scheduling process, patient verbalized understanding. Will contact patient for scheduling once f/u has been completed.  

## 2022-03-14 ENCOUNTER — Ambulatory Visit (INDEPENDENT_AMBULATORY_CARE_PROVIDER_SITE_OTHER): Payer: Self-pay | Admitting: Thoracic Surgery (Cardiothoracic Vascular Surgery)

## 2022-03-14 DIAGNOSIS — I2510 Atherosclerotic heart disease of native coronary artery without angina pectoris: Secondary | ICD-10-CM

## 2022-03-17 ENCOUNTER — Encounter: Payer: Self-pay | Admitting: Cardiovascular Disease

## 2022-03-18 ENCOUNTER — Ambulatory Visit: Payer: Medicare HMO | Admitting: Cardiovascular Disease

## 2022-03-18 ENCOUNTER — Encounter: Payer: Self-pay | Admitting: Cardiovascular Disease

## 2022-03-18 VITALS — BP 110/78 | HR 76 | Ht 75.0 in | Wt 209.0 lb

## 2022-03-18 DIAGNOSIS — I2510 Atherosclerotic heart disease of native coronary artery without angina pectoris: Secondary | ICD-10-CM

## 2022-03-18 DIAGNOSIS — E7800 Pure hypercholesterolemia, unspecified: Secondary | ICD-10-CM | POA: Diagnosis not present

## 2022-04-08 DIAGNOSIS — L308 Other specified dermatitis: Secondary | ICD-10-CM | POA: Diagnosis not present

## 2022-04-08 DIAGNOSIS — C4442 Squamous cell carcinoma of skin of scalp and neck: Secondary | ICD-10-CM | POA: Diagnosis not present

## 2022-04-11 ENCOUNTER — Other Ambulatory Visit: Payer: Self-pay | Admitting: Emergency Medicine

## 2022-04-14 ENCOUNTER — Other Ambulatory Visit: Payer: Self-pay | Admitting: Thoracic Surgery (Cardiothoracic Vascular Surgery)

## 2022-04-14 DIAGNOSIS — I77810 Thoracic aortic ectasia: Secondary | ICD-10-CM

## 2022-04-15 ENCOUNTER — Ambulatory Visit
Admission: RE | Admit: 2022-04-15 | Discharge: 2022-04-15 | Disposition: A | Discharge status: Home / Self Care | Payer: Medicare HMO | Source: Ambulatory Visit | Attending: Thoracic Surgery (Cardiothoracic Vascular Surgery) | Admitting: Thoracic Surgery (Cardiothoracic Vascular Surgery)

## 2022-04-15 ENCOUNTER — Ambulatory Visit (INDEPENDENT_AMBULATORY_CARE_PROVIDER_SITE_OTHER): Payer: Self-pay | Admitting: Surgical

## 2022-04-15 VITALS — BP 122/74 | HR 80 | Resp 20 | Ht 75.0 in | Wt 211.0 lb

## 2022-04-15 DIAGNOSIS — M47814 Spondylosis without myelopathy or radiculopathy, thoracic region: Secondary | ICD-10-CM | POA: Diagnosis not present

## 2022-04-15 DIAGNOSIS — Z951 Presence of aortocoronary bypass graft: Secondary | ICD-10-CM

## 2022-04-15 DIAGNOSIS — I77810 Thoracic aortic ectasia: Secondary | ICD-10-CM

## 2022-04-15 DIAGNOSIS — I2510 Atherosclerotic heart disease of native coronary artery without angina pectoris: Secondary | ICD-10-CM
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
## 2022-04-15 NOTE — Patient Instructions (Signed)
Activity progression as discussed including lifting and other restrictions

## 2022-04-17 ENCOUNTER — Ambulatory Visit: Payer: Medicare HMO

## 2022-04-21 DIAGNOSIS — D044 Carcinoma in situ of skin of scalp and neck: Secondary | ICD-10-CM | POA: Diagnosis not present

## 2022-04-27 ENCOUNTER — Other Ambulatory Visit: Payer: Self-pay | Admitting: Emergency Medicine

## 2022-05-05 DIAGNOSIS — D044 Carcinoma in situ of skin of scalp and neck: Secondary | ICD-10-CM | POA: Diagnosis not present

## 2022-05-14 DIAGNOSIS — H6693 Otitis media, unspecified, bilateral: Secondary | ICD-10-CM | POA: Diagnosis not present

## 2022-05-14 DIAGNOSIS — J0101 Acute recurrent maxillary sinusitis: Secondary | ICD-10-CM | POA: Diagnosis not present

## 2022-05-22 ENCOUNTER — Telehealth (HOSPITAL_COMMUNITY): Payer: Self-pay

## 2022-05-22 NOTE — Telephone Encounter (Signed)
Called pt to see if he was interested in the cardiac rehab program. Pt stated that he is not interested.  Closed referral.

## 2022-06-09 ENCOUNTER — Other Ambulatory Visit: Payer: Medicare HMO

## 2022-06-11 DIAGNOSIS — R300 Dysuria: Secondary | ICD-10-CM | POA: Diagnosis not present

## 2022-06-11 DIAGNOSIS — E785 Hyperlipidemia, unspecified: Secondary | ICD-10-CM | POA: Diagnosis not present

## 2022-06-11 DIAGNOSIS — R197 Diarrhea, unspecified: Secondary | ICD-10-CM | POA: Diagnosis not present

## 2022-06-11 DIAGNOSIS — Z0000 Encounter for general adult medical examination without abnormal findings: Secondary | ICD-10-CM | POA: Diagnosis not present

## 2022-06-11 DIAGNOSIS — R7303 Prediabetes: Secondary | ICD-10-CM | POA: Diagnosis not present

## 2022-06-11 DIAGNOSIS — Z8042 Family history of malignant neoplasm of prostate: Secondary | ICD-10-CM | POA: Diagnosis not present

## 2022-06-11 DIAGNOSIS — J00 Acute nasopharyngitis [common cold]: Secondary | ICD-10-CM | POA: Diagnosis not present

## 2022-06-11 DIAGNOSIS — Z125 Encounter for screening for malignant neoplasm of prostate: Secondary | ICD-10-CM | POA: Diagnosis not present

## 2022-06-11 DIAGNOSIS — I77819 Aortic ectasia, unspecified site: Secondary | ICD-10-CM | POA: Diagnosis not present

## 2022-06-11 DIAGNOSIS — B353 Tinea pedis: Secondary | ICD-10-CM | POA: Diagnosis not present

## 2022-06-11 DIAGNOSIS — I77810 Thoracic aortic ectasia: Secondary | ICD-10-CM | POA: Diagnosis not present

## 2022-06-24 DIAGNOSIS — N50812 Left testicular pain: Secondary | ICD-10-CM | POA: Diagnosis not present

## 2022-06-30 DIAGNOSIS — D485 Neoplasm of uncertain behavior of skin: Secondary | ICD-10-CM | POA: Diagnosis not present

## 2022-06-30 DIAGNOSIS — Z08 Encounter for follow-up examination after completed treatment for malignant neoplasm: Secondary | ICD-10-CM | POA: Diagnosis not present

## 2022-06-30 DIAGNOSIS — Z86007 Personal history of in-situ neoplasm of skin: Secondary | ICD-10-CM | POA: Diagnosis not present

## 2022-06-30 DIAGNOSIS — L738 Other specified follicular disorders: Secondary | ICD-10-CM | POA: Diagnosis not present

## 2022-07-25 ENCOUNTER — Other Ambulatory Visit: Payer: Self-pay | Admitting: Physician Assistant

## 2022-07-28 ENCOUNTER — Other Ambulatory Visit: Payer: Self-pay | Admitting: Physician Assistant

## 2022-08-01 ENCOUNTER — Ambulatory Visit: Payer: Medicare HMO | Admitting: Thoracic Surgery (Cardiothoracic Vascular Surgery)

## 2022-08-01 ENCOUNTER — Other Ambulatory Visit: Payer: Self-pay | Admitting: Thoracic Surgery (Cardiothoracic Vascular Surgery)

## 2022-08-01 ENCOUNTER — Other Ambulatory Visit: Payer: Self-pay | Admitting: *Deleted

## 2022-08-01 VITALS — BP 132/83 | HR 66 | Resp 20 | Ht 75.0 in | Wt 217.0 lb

## 2022-08-01 DIAGNOSIS — Z951 Presence of aortocoronary bypass graft: Secondary | ICD-10-CM

## 2022-08-01 NOTE — Progress Notes (Signed)
Per Dr. Kipp Brood, referral to cardiac rehab at [facility] placed.

## 2022-08-04 NOTE — Progress Notes (Signed)
      [address]       [place] [address]             [phone]        Jamy T Charpentier [place] Medical Record #[id number] Date of Birth: 06-Feb-1951  Referring: Troy Sine, MD Primary Care: Heywood Bene, PA-C Primary Cardiologist:Philip Nahser, MD  Reason for visit:   follow-up  History of Present Illness:     71yo male s/p CABG in June presents with complaint of chest wall tenderness.  This pain is reproducible to touch and with certain movements.  Physical Exam: BP 132/83 (BP Location: Left Arm, Patient Position: Sitting)   Pulse 66   Resp 20   Ht '6\' 3"'$  (1.905 m)   Wt 217 lb (98.4 kg)   SpO2 99% Comment: RA  BMI 27.12 kg/m   Alert NAD Incision clean.  Sternum stable Abdomen, ND no peripheral edema      Assessment / Plan:   71yo male s/p CABG in June of this year.  He has chest wall pain around his sternotomy.  He did not attend cardiac rehab, and went straight to working out.  I will obtain a CT chest to check his wires.  His sternum appear well healed on exam.     Lajuana Matte 08/04/2022 8:01 AM

## 2022-08-05 DIAGNOSIS — R82998 Other abnormal findings in urine: Secondary | ICD-10-CM | POA: Diagnosis not present

## 2022-08-05 DIAGNOSIS — R8290 Unspecified abnormal findings in urine: Secondary | ICD-10-CM | POA: Diagnosis not present

## 2022-08-08 ENCOUNTER — Ambulatory Visit (HOSPITAL_COMMUNITY)
Admission: RE | Admit: 2022-08-08 | Discharge: 2022-08-08 | Disposition: A | Discharge status: Home / Self Care | Payer: Medicare HMO | Source: Ambulatory Visit | Attending: Thoracic Surgery (Cardiothoracic Vascular Surgery) | Admitting: Thoracic Surgery (Cardiothoracic Vascular Surgery)

## 2022-08-08 DIAGNOSIS — I700 Atherosclerosis of aorta: Secondary | ICD-10-CM | POA: Insufficient documentation

## 2022-08-08 DIAGNOSIS — Z951 Presence of aortocoronary bypass graft: Secondary | ICD-10-CM | POA: Diagnosis not present

## 2022-08-08 DIAGNOSIS — R0789 Other chest pain: Secondary | ICD-10-CM | POA: Insufficient documentation

## 2022-08-25 DIAGNOSIS — R8290 Unspecified abnormal findings in urine: Secondary | ICD-10-CM | POA: Diagnosis not present

## 2022-08-25 DIAGNOSIS — R82998 Other abnormal findings in urine: Secondary | ICD-10-CM | POA: Diagnosis not present

## 2022-08-26 ENCOUNTER — Telehealth (HOSPITAL_COMMUNITY): Payer: Self-pay

## 2022-08-26 NOTE — Telephone Encounter (Signed)
Called and spoke with pt in regards to CR, pt stated he is not interested at this time. He has a lot going on and will not be around.   Closed referral

## 2022-08-28 ENCOUNTER — Ambulatory Visit (INDEPENDENT_AMBULATORY_CARE_PROVIDER_SITE_OTHER): Payer: Medicare HMO | Admitting: Thoracic Surgery (Cardiothoracic Vascular Surgery)

## 2022-08-28 DIAGNOSIS — Z951 Presence of aortocoronary bypass graft: Secondary | ICD-10-CM

## 2022-08-28 DIAGNOSIS — R0789 Other chest pain: Secondary | ICD-10-CM

## 2022-08-28 NOTE — Progress Notes (Signed)
     [address]       ,[place] [address]             [phone]       Patient: Home Provider: Office Consent for Telemedicine visit obtained.  Today's visit was completed via a real-time telehealth (see specific modality noted below). The patient/authorized person provided oral consent at the time of the visit to engage in a telemedicine encounter with the present provider at [facility]. The patient/authorized person was informed of the potential benefits, limitations, and risks of telemedicine. The patient/authorized person expressed understanding that the laws that protect confidentiality also apply to telemedicine. The patient/authorized person acknowledged understanding that telemedicine does not provide emergency services and that he or she would need to call 911 or proceed to the nearest hospital for help if such a need arose.   Total time spent in the clinical discussion 10 minutes.  Telehealth Modality: Phone visit (audio only)  I had a telephone visit with Kyle Davidson.  He recently got back from [place] with his grandchildren.  He states that he did have some swelling while he was in [place] but this is improved.  His sternal pain is unchanged.  He is scheduled to start cardiac rehab.  I reviewed the cross-sectional imaging, and there is no evidence of any broken wires or malunion.  I recommended that he continue with cardiac rehab for more mobility in his chest.

## 2022-09-04 ENCOUNTER — Ambulatory Visit: Payer: Medicare HMO | Attending: Cardiovascular Disease

## 2022-09-04 DIAGNOSIS — I2510 Atherosclerotic heart disease of native coronary artery without angina pectoris: Secondary | ICD-10-CM | POA: Diagnosis not present

## 2022-09-04 DIAGNOSIS — E7800 Pure hypercholesterolemia, unspecified: Secondary | ICD-10-CM | POA: Diagnosis not present

## 2022-09-04 LAB — BASIC METABOLIC PANEL
BUN/Creatinine Ratio: 14 (ref 10–24)
BUN: 12 mg/dL (ref 8–27)
CO2: 23 mmol/L (ref 20–29)
Calcium: 9.6 mg/dL (ref 8.6–10.2)
Chloride: 105 mmol/L (ref 96–106)
Creatinine, Ser: 0.86 mg/dL (ref 0.76–1.27)
Glucose: 137 mg/dL — ABNORMAL HIGH (ref 70–99)
Potassium: 4.2 mmol/L (ref 3.5–5.2)
Sodium: 143 mmol/L (ref 134–144)
eGFR: 93 mL/min/{1.73_m2} (ref >59)

## 2022-09-04 LAB — LIPID PANEL
Chol/HDL Ratio: 5.9 ratio — ABNORMAL HIGH (ref 0.0–5.0)
Cholesterol, Total: 141 mg/dL (ref 100–199)
HDL: 24 mg/dL — ABNORMAL LOW (ref >39)
LDL Chol Calc (NIH): 79 mg/dL (ref 0–99)
Triglycerides: 226 mg/dL — ABNORMAL HIGH (ref 0–149)
VLDL Cholesterol Cal: 38 mg/dL (ref 5–40)

## 2022-09-04 LAB — ALT: ALT: 11 IU/L (ref 0–44)

## 2022-09-07 ENCOUNTER — Encounter: Payer: Self-pay | Admitting: Cardiovascular Disease
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
## 2022-09-08 ENCOUNTER — Encounter: Payer: Self-pay | Admitting: Cardiovascular Disease

## 2022-09-08 ENCOUNTER — Ambulatory Visit: Payer: Medicare HMO | Attending: Cardiovascular Disease | Admitting: Cardiovascular Disease

## 2022-09-08 VITALS — BP 132/58 | HR 74 | Ht 75.0 in | Wt 218.0 lb

## 2022-09-08 DIAGNOSIS — I77810 Thoracic aortic ectasia: Secondary | ICD-10-CM

## 2022-09-08 DIAGNOSIS — E7800 Pure hypercholesterolemia, unspecified: Secondary | ICD-10-CM

## 2022-09-08 DIAGNOSIS — I2510 Atherosclerotic heart disease of native coronary artery without angina pectoris: Secondary | ICD-10-CM

## 2022-09-08 MED ORDER — ASPIRIN 81 MG PO TBEC: 81.0000 mg | DELAYED_RELEASE_TABLET | Freq: Every day | ORAL | 3 refills | Status: AC

## 2022-09-08 MED ORDER — ICOSAPENT ETHYL 1 G PO CAPS: 2.0000 g | ORAL_CAPSULE | Freq: Two times a day (BID) | ORAL | 11 refills | Status: DC

## 2022-09-08 NOTE — Patient Instructions (Addendum)
Medication Instructions:  START Vascepa 2gm twice daily DECREASE Aspirin to '81mg'$  daily *If you need a refill on your cardiac medications before your next appointment, please call your pharmacy*   Lab Work: Lipids, ALT, BMET, LP(a) in 3 months If you have labs (blood work) drawn today and your tests are completely normal, you will receive your results only by: [place] (if you have MyChart) OR A paper copy in the mail If you have any lab test that is abnormal or we need to change your treatment, we will call you to review the results.   Testing/Procedures: NONE   Follow-Up: At [facility], you and your health needs are our priority.  As part of our continuing mission to provide you with exceptional heart care, we have created designated Provider Care Teams.  These Care Teams include your primary Cardiologist (physician) and Advanced Practice Providers (APPs -  Physician Assistants and Nurse Practitioners) who all work together to provide you with the care you need, when you need it.  We recommend signing up for the patient portal called "MyChart".  Sign up information is provided on this After Visit Summary.  MyChart is used to connect with patients for Virtual Visits (Telemedicine).  Patients are able to view lab/test results, encounter notes, upcoming appointments, etc.  Non-urgent messages can be sent to your provider as well.   To learn more about what you can do with MyChart, go to [email or web address].    Your next appointment:   6 month(s)  The format for your next appointment:   In Person  Provider:   Mertie Moores, MD       Important Information About Sugar      Adopting a Healthy Lifestyle.   Weight: Know what a healthy weight is for you (roughly BMI <25) and aim to maintain this. You can calculate your body mass index on your smart phone  Diet: Aim for 7+ servings of fruits and vegetables daily Limit animal fats in diet for cholesterol and  heart health - choose grass fed whenever available Avoid highly processed foods (fast food burgers, tacos, fried chicken, pizza, hot dogs, french fries)  Saturated fat comes in the form of butter, lard, coconut oil, margarine, partially hydrogenated oils, and fat in meat. These increase your risk of cardiovascular disease.  Use healthy plant oils, such as olive, canola, soy, corn, sunflower and peanut.  Whole foods such as fruits, vegetables and whole grains have fiber  Men need > 38 grams of fiber per day Women need > 25 grams of fiber per day  Load up on vegetables and fruits - one-half of your plate: Aim for color and variety, and remember that potatoes dont count. Go for whole grains - one-quarter of your plate: Whole wheat, barley, wheat berries, quinoa, oats, brown rice, and foods made with them. If you want pasta, go with whole wheat pasta. Protein power - one-quarter of your plate: Fish, chicken, beans, and nuts are all healthy, versatile protein sources. Limit red meat. You need carbohydrates for energy! The type of carbohydrate is more important than the amount. Choose carbohydrates such as vegetables, fruits, whole grains, beans, and nuts in the place of white rice, white pasta, potatoes (baked or fried), macaroni and cheese, cakes, cookies, and donuts.  If youre thirsty, drink water. Coffee and tea are good in moderation, but skip sugary drinks and limit milk and dairy products to one or two daily servings. Keep sugar intake at 6 teaspoons or 24 grams  or LESS       Exercise: Aim for 150 min of moderate intensity exercise weekly for heart health, and weights twice weekly for bone health Stay active - any steps are better than no steps! Aim for 7-9 hours of sleep daily

## 2022-09-10 ENCOUNTER — Telehealth: Payer: Self-pay | Admitting: Cardiovascular Disease

## 2022-09-10 DIAGNOSIS — E7800 Pure hypercholesterolemia, unspecified: Secondary | ICD-10-CM

## 2022-09-10 MED ORDER — FENOFIBRATE 145 MG PO TABS: 145.0000 mg | ORAL_TABLET | Freq: Every day | ORAL | 3 refills | Status: DC

## 2022-09-10 NOTE — Telephone Encounter (Signed)
-----   Message from Thayer Headings, MD sent at 09/07/2022  6:09 PM EST ----- Hx of CAD . LDL is 79,  goal is <55 Please refer to lipid clinic for consideration of PSK9 inhibitor  Trigs and glucose levels are elevated  Add fenofibrate 145 mg a day

## 2022-09-10 NOTE — Telephone Encounter (Signed)
Called and spoke with patient who understands to start Fenofibrate. States that the Vascepa was $250 and he could not afford to pick it up. Advised he attempt another pharmacy, but pt refuses. [facility] Will remove from med list. Referral placed to lipid clinic.

## 2022-09-18 DIAGNOSIS — R319 Hematuria, unspecified: Secondary | ICD-10-CM | POA: Diagnosis not present

## 2022-09-18 DIAGNOSIS — R310 Gross hematuria: Secondary | ICD-10-CM | POA: Diagnosis not present

## 2022-09-27 DIAGNOSIS — J0100 Acute maxillary sinusitis, unspecified: Secondary | ICD-10-CM | POA: Diagnosis not present

## 2022-10-10 DIAGNOSIS — R310 Gross hematuria: Secondary | ICD-10-CM | POA: Diagnosis not present

## 2022-10-30 ENCOUNTER — Telehealth: Payer: Self-pay | Admitting: Cardiovascular Disease

## 2022-10-30 NOTE — Telephone Encounter (Signed)
10/30/2022 Patient requested to switch from Dr.Nahser to [facility] from this point forward, and Only see Dr.Kelly or Jory Sims for follow up's.

## 2022-11-07 DIAGNOSIS — R1032 Left lower quadrant pain: Secondary | ICD-10-CM | POA: Diagnosis not present

## 2022-11-17 NOTE — Telephone Encounter (Signed)
OK 

## 2022-11-24 ENCOUNTER — Other Ambulatory Visit: Payer: Self-pay

## 2022-11-24 DIAGNOSIS — E7800 Pure hypercholesterolemia, unspecified: Secondary | ICD-10-CM | POA: Diagnosis not present

## 2022-11-24 DIAGNOSIS — G5782 Other specified mononeuropathies of left lower limb: Secondary | ICD-10-CM | POA: Diagnosis not present

## 2022-11-24 DIAGNOSIS — I2510 Atherosclerotic heart disease of native coronary artery without angina pectoris: Secondary | ICD-10-CM

## 2022-11-24 DIAGNOSIS — I77810 Thoracic aortic ectasia: Secondary | ICD-10-CM | POA: Diagnosis not present

## 2022-11-24 DIAGNOSIS — M25552 Pain in left hip: Secondary | ICD-10-CM | POA: Diagnosis not present

## 2022-11-26 LAB — LIPID PANEL
Chol/HDL Ratio: 6.8 ratio — ABNORMAL HIGH (ref 0.0–5.0)
Cholesterol, Total: 163 mg/dL (ref 100–199)
HDL: 24 mg/dL — ABNORMAL LOW (ref >39)
LDL Chol Calc (NIH): 109 mg/dL — ABNORMAL HIGH (ref 0–99)
Triglycerides: 165 mg/dL — ABNORMAL HIGH (ref 0–149)
VLDL Cholesterol Cal: 30 mg/dL (ref 5–40)

## 2022-11-26 LAB — BASIC METABOLIC PANEL
BUN/Creatinine Ratio: 14 (ref 10–24)
BUN: 14 mg/dL (ref 8–27)
CO2: 23 mmol/L (ref 20–29)
Calcium: 9.8 mg/dL (ref 8.6–10.2)
Chloride: 106 mmol/L (ref 96–106)
Creatinine, Ser: 0.99 mg/dL (ref 0.76–1.27)
Glucose: 104 mg/dL — ABNORMAL HIGH (ref 70–99)
Potassium: 4.6 mmol/L (ref 3.5–5.2)
Sodium: 143 mmol/L (ref 134–144)
eGFR: 81 mL/min/{1.73_m2} (ref >59)

## 2022-11-26 LAB — ALT: ALT: 11 IU/L (ref 0–44)

## 2022-11-26 LAB — LIPOPROTEIN A (LPA): Lipoprotein (a): 19.1 nmol/L (ref <75.0)

## 2022-11-28 ENCOUNTER — Other Ambulatory Visit: Payer: Self-pay | Admitting: Cardiovascular Disease

## 2022-12-01 ENCOUNTER — Telehealth: Payer: Self-pay | Admitting: Cardiovascular Disease

## 2022-12-01 MED ORDER — ATORVASTATIN CALCIUM 80 MG PO TABS: 80.0000 mg | ORAL_TABLET | Freq: Every day | ORAL | 1 refills | Status: DC

## 2022-12-01 NOTE — Telephone Encounter (Signed)
*  STAT* If patient is at the pharmacy, call can be transferred to refill team.   1. Which medications need to be refilled? (please list name of each medication and dose if known) atorvastatin (LIPITOR) 80 MG tablet   2. Which pharmacy/location (including street and city if local pharmacy) is medication to be sent to?  [id number] - [place], [place] - [address]    3. Do they need a 30 day or 90 day supply? [place]

## 2022-12-01 NOTE — Telephone Encounter (Signed)
Refills has been sent to the pharmacy. 

## 2022-12-02 DIAGNOSIS — J069 Acute upper respiratory infection, unspecified: Secondary | ICD-10-CM | POA: Diagnosis not present

## 2022-12-03 DIAGNOSIS — G5782 Other specified mononeuropathies of left lower limb: Secondary | ICD-10-CM | POA: Diagnosis not present

## 2022-12-08 ENCOUNTER — Other Ambulatory Visit: Payer: Medicare HMO

## 2023-01-18 ENCOUNTER — Other Ambulatory Visit: Payer: Self-pay | Admitting: Cardiovascular Disease

## 2023-01-19 ENCOUNTER — Telehealth: Payer: Self-pay | Admitting: Cardiovascular Disease

## 2023-01-19 NOTE — Telephone Encounter (Signed)
Pt c/o medication issue:  1. Name of Medication:     2. How are you currently taking this medication (dosage and times per day)? Patient states he has still been taking this medication   3. Are you having a reaction (difficulty breathing--STAT)?   4. What is your medication issue? Patient would like clarification on if this mediation should be taken still. Please advise.

## 2023-01-19 NOTE — Telephone Encounter (Signed)
Spoke with pt regarding taking amlodipine. Pt states that he is still taking this 5mg  once daily. Per chart review, it looks like pt told Dr. Elease Hashimoto that he was no longer taking this medication so they took it off his medication list. Pt states that he doesn't remember reporting this and has continued to take this medication. Pt does not routinely monitor his blood pressure at home but reports that he hasn't felt any issues related to his blood pressure. Pt would like a refill on this medication if he should continue to take. Pt makes me aware that he is in the middle of a provider switch. Pt was accepted by Dr. Tresa Endo and is scheduled to see Joni Reining, NP on 6/17. Will forward to provider to advise.

## 2023-01-21 MED ORDER — AMLODIPINE BESYLATE 5 MG PO TABS: 5.0000 mg | ORAL_TABLET | Freq: Every day | ORAL | 0 refills | Status: DC

## 2023-01-21 NOTE — Telephone Encounter (Signed)
Sent refill in for 90 days with zero refills on Amlodipine 5 mg to carry pt through next appt with Samara Deist lawrence who can then determine if pt should continue on therapy.   Nahser, Deloris Ping, MD  Eugene Gavia K Caller: Unspecified (2 days ago, 10:11 AM) If his BP is normal and he is taking amlodipine, he may continue to take it and please refill  He will establish with Dr. Tresa Endo soon  PN

## 2023-02-06 DIAGNOSIS — J069 Acute upper respiratory infection, unspecified: Secondary | ICD-10-CM | POA: Diagnosis not present

## 2023-02-06 DIAGNOSIS — L820 Inflamed seborrheic keratosis: Secondary | ICD-10-CM | POA: Diagnosis not present
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
## 2023-03-09 ENCOUNTER — Ambulatory Visit: Payer: Medicare HMO | Attending: Cardiovascular Disease | Admitting: Adult Health

## 2023-03-09 ENCOUNTER — Encounter: Payer: Self-pay | Admitting: Adult Health

## 2023-03-09 VITALS — BP 138/62 | HR 60 | Ht 75.0 in | Wt 211.4 lb

## 2023-03-09 DIAGNOSIS — I714 Abdominal aortic aneurysm, without rupture, unspecified: Secondary | ICD-10-CM

## 2023-03-09 MED ORDER — AMLODIPINE BESYLATE 5 MG PO TABS: 5.0000 mg | ORAL_TABLET | Freq: Every day | ORAL | 0 refills | Status: DC

## 2023-03-09 MED ORDER — ATORVASTATIN CALCIUM 80 MG PO TABS: 80.0000 mg | ORAL_TABLET | Freq: Every day | ORAL | 1 refills | Status: DC

## 2023-03-09 NOTE — Patient Instructions (Signed)
Medication Instructions:  No Changes *If you need a refill on your cardiac medications before your next appointment, please call your pharmacy*   Lab Work: No Labs If you have labs (blood work) drawn today and your tests are completely normal, you will receive your results only by: MyChart Message (if you have MyChart) OR A paper copy in the mail If you have any lab test that is abnormal or we need to change your treatment, we will call you to review the results.   Testing/Procedures: [facility] Non-Cardiac CT scanning, (CAT scanning), is a noninvasive, special x-ray that produces cross-sectional images of the body using x-rays and a computer. CT scans help physicians diagnose and treat medical conditions. For some CT exams, a contrast material is used to enhance visibility in the area of the body being studied. CT scans provide greater clarity and reveal more details than regular x-ray exams.    Follow-Up: At [facility], you and your health needs are our priority.  As part of our continuing mission to provide you with exceptional heart care, we have created designated Provider Care Teams.  These Care Teams include your primary Cardiologist (physician) and Advanced Practice Providers (APPs -  Physician Assistants and Nurse Practitioners) who all work together to provide you with the care you need, when you need it.  We recommend signing up for the patient portal called "MyChart".  Sign up information is provided on this After Visit Summary.  MyChart is used to connect with patients for Virtual Visits (Telemedicine).  Patients are able to view lab/test results, encounter notes, upcoming appointments, etc.  Non-urgent messages can be sent to your provider as well.   To learn more about what you can do with MyChart, go to [email or web address].    Your next appointment:   6 month(s)  Provider:   Nicki Guadalajara, MD

## 2023-03-10 ENCOUNTER — Ambulatory Visit: Payer: Medicare HMO | Admitting: Cardiovascular Disease

## 2023-03-12 ENCOUNTER — Ambulatory Visit (HOSPITAL_COMMUNITY)
Admission: RE | Admit: 2023-03-12 | Discharge: 2023-03-12 | Disposition: A | Discharge status: Home / Self Care | Payer: Medicare HMO | Source: Ambulatory Visit | Attending: Adult Health | Admitting: Adult Health

## 2023-03-12 DIAGNOSIS — I714 Abdominal aortic aneurysm, without rupture, unspecified: Secondary | ICD-10-CM | POA: Insufficient documentation

## 2023-03-12 DIAGNOSIS — J9811 Atelectasis: Secondary | ICD-10-CM | POA: Diagnosis not present

## 2023-03-12 MED ORDER — IOHEXOL 350 MG/ML SOLN
75.0000 mL | Freq: Once | INTRAVENOUS | Status: AC | PRN
  Administered 2023-03-12: 75 mL via INTRAVENOUS

## 2023-03-13 ENCOUNTER — Telehealth: Payer: Self-pay

## 2023-03-13 NOTE — Telephone Encounter (Addendum)
Called patient regarding results. Patient had understanding of results.----- Message from Jodelle Gross, NP sent at 03/13/2023  7:05 AM EDT ----- The CT scan did not indicate enlargement of his aorta.  This means that the measurement is normal, with no evidence of aneurysm. Showed where he had blockages in his coronary arteries, that were bypassed.  Good report

## 2023-04-13 DIAGNOSIS — B353 Tinea pedis: Secondary | ICD-10-CM | POA: Diagnosis not present

## 2023-04-13 DIAGNOSIS — J40 Bronchitis, not specified as acute or chronic: Secondary | ICD-10-CM | POA: Diagnosis not present

## 2023-04-16 ENCOUNTER — Other Ambulatory Visit: Payer: Self-pay | Admitting: Physician Assistant

## 2023-04-16 ENCOUNTER — Other Ambulatory Visit: Payer: Self-pay | Admitting: Cardiovascular Disease

## 2023-04-20 ENCOUNTER — Telehealth: Payer: Self-pay | Admitting: *Deleted

## 2023-04-20 DIAGNOSIS — R194 Change in bowel habit: Secondary | ICD-10-CM | POA: Diagnosis not present

## 2023-04-20 NOTE — Telephone Encounter (Signed)
  Kyle Davidson  We have received a surgical clearance request for Mr. Kyle Davidson. They were seen recently in clinic by you on 03/12/2023. Can you please comment on surgical clearance and guidance on holding ASA. Please forward you guidance and recommendations to P CV DIV PREOP.  Thanks,  Robin Searing, NP

## 2023-04-20 NOTE — Telephone Encounter (Signed)
   Pre-operative Risk Assessment    Patient Name: Kyle Davidson  DOB: 25-Nov-1950 MRN: [id number]      Request for Surgical Clearance    Procedure:   ENDOSCOPY/COLONOSCOPY  (OROPHARYNGEAL DYSPHAGIA, CHANGE IN BOWEL HABITS)  Date of Surgery:  Clearance 05/12/23                                 Surgeon:  DR. PARAG BRAHMBHATT Surgeon's Group or Practice Name:  [facility] Phone number:  [phone] Fax number:  [phone]   Type of Clearance Requested:   - Medical ; NO MEDICATIONS LISTED AS NEEDING TO BE HELD   Type of Anesthesia:   PROPOFOL   Additional requests/questions:    Elpidio Anis   04/20/2023, 12:24 PM

## 2023-04-21 NOTE — Telephone Encounter (Signed)
   Patient Name: Kyle Davidson  DOB: [birth year] MRN: [id number]  Primary Cardiologist: Nicki Guadalajara, MD  Chart reviewed as part of pre-operative protocol coverage. Given past medical history and time since last visit, based on ACC/AHA guidelines, Kyle Davidson is at acceptable risk for the planned procedure without further cardiovascular testing.  He is able to complete g 4 METS of activity and patient's RCRI score is 6.6%.  He reports no current chest discomfort or pain since his follow-up visit in June.  He was advised to hold ASA 81 mg 7 days prior to procedure and should restart postprocedure when surgically safe.  The patient was advised that if he develops new symptoms prior to surgery to contact our office to arrange for a follow-up visit, and he verbalized understanding.  I will route this recommendation to the requesting party via Epic fax function and remove from pre-op pool.  Please call with questions.  Napoleon Form, Leodis Rains, NP 04/21/2023, 9:31 AM

## 2023-04-30 DIAGNOSIS — H2513 Age-related nuclear cataract, bilateral: Secondary | ICD-10-CM | POA: Diagnosis not present

## 2023-04-30 DIAGNOSIS — H524 Presbyopia: Secondary | ICD-10-CM | POA: Diagnosis not present

## 2023-05-12 DIAGNOSIS — Z86010 Personal history of colonic polyps: Secondary | ICD-10-CM | POA: Diagnosis not present

## 2023-05-12 DIAGNOSIS — D125 Benign neoplasm of sigmoid colon: Secondary | ICD-10-CM | POA: Diagnosis not present

## 2023-05-12 DIAGNOSIS — R1310 Dysphagia, unspecified: Secondary | ICD-10-CM | POA: Diagnosis not present

## 2023-05-12 DIAGNOSIS — K648 Other hemorrhoids: Secondary | ICD-10-CM | POA: Diagnosis not present

## 2023-05-12 DIAGNOSIS — R194 Change in bowel habit: Secondary | ICD-10-CM | POA: Diagnosis not present

## 2023-05-12 DIAGNOSIS — D123 Benign neoplasm of transverse colon: Secondary | ICD-10-CM | POA: Diagnosis not present

## 2023-05-12 DIAGNOSIS — K317 Polyp of stomach and duodenum: Secondary | ICD-10-CM | POA: Diagnosis not present

## 2023-05-12 DIAGNOSIS — K224 Dyskinesia of esophagus: Secondary | ICD-10-CM | POA: Diagnosis not present

## 2023-05-12 DIAGNOSIS — D124 Benign neoplasm of descending colon: Secondary | ICD-10-CM | POA: Diagnosis not present

## 2023-05-14 DIAGNOSIS — D124 Benign neoplasm of descending colon: Secondary | ICD-10-CM | POA: Diagnosis not present

## 2023-05-14 DIAGNOSIS — D123 Benign neoplasm of transverse colon: Secondary | ICD-10-CM | POA: Diagnosis not present

## 2023-05-14 DIAGNOSIS — D125 Benign neoplasm of sigmoid colon: Secondary | ICD-10-CM | POA: Diagnosis not present

## 2023-06-16 DIAGNOSIS — E785 Hyperlipidemia, unspecified: Secondary | ICD-10-CM | POA: Diagnosis not present

## 2023-06-16 DIAGNOSIS — R17 Unspecified jaundice: Secondary | ICD-10-CM | POA: Diagnosis not present

## 2023-06-16 DIAGNOSIS — R7303 Prediabetes: Secondary | ICD-10-CM | POA: Diagnosis not present

## 2023-06-16 DIAGNOSIS — B353 Tinea pedis: Secondary | ICD-10-CM | POA: Diagnosis not present

## 2023-06-16 DIAGNOSIS — I10 Essential (primary) hypertension: Secondary | ICD-10-CM | POA: Diagnosis not present

## 2023-06-16 DIAGNOSIS — Z0000 Encounter for general adult medical examination without abnormal findings: Secondary | ICD-10-CM | POA: Diagnosis not present

## 2023-06-16 DIAGNOSIS — R81 Glycosuria: Secondary | ICD-10-CM | POA: Diagnosis not present

## 2023-06-16 DIAGNOSIS — R300 Dysuria: Secondary | ICD-10-CM | POA: Diagnosis not present

## 2023-06-16 DIAGNOSIS — Z125 Encounter for screening for malignant neoplasm of prostate: Secondary | ICD-10-CM | POA: Diagnosis not present

## 2023-06-16 DIAGNOSIS — Z5181 Encounter for therapeutic drug level monitoring: Secondary | ICD-10-CM | POA: Diagnosis not present

## 2023-06-17 DIAGNOSIS — R1032 Left lower quadrant pain: Secondary | ICD-10-CM | POA: Diagnosis not present

## 2023-07-12 ENCOUNTER — Other Ambulatory Visit: Payer: Self-pay | Admitting: Adult Health

## 2023-07-13 DIAGNOSIS — L821 Other seborrheic keratosis: Secondary | ICD-10-CM | POA: Diagnosis not present

## 2023-07-13 DIAGNOSIS — L309 Dermatitis, unspecified: Secondary | ICD-10-CM | POA: Diagnosis not present

## 2023-07-13 DIAGNOSIS — D485 Neoplasm of uncertain behavior of skin: Secondary | ICD-10-CM | POA: Diagnosis not present

## 2023-07-13 DIAGNOSIS — L814 Other melanin hyperpigmentation: Secondary | ICD-10-CM | POA: Diagnosis not present

## 2023-07-13 DIAGNOSIS — D1801 Hemangioma of skin and subcutaneous tissue: Secondary | ICD-10-CM | POA: Diagnosis not present

## 2023-07-13 DIAGNOSIS — K219 Gastro-esophageal reflux disease without esophagitis: Secondary | ICD-10-CM | POA: Diagnosis not present

## 2023-07-13 DIAGNOSIS — L570 Actinic keratosis: Secondary | ICD-10-CM | POA: Diagnosis not present

## 2023-07-13 DIAGNOSIS — Z9889 Other specified postprocedural states: Secondary | ICD-10-CM | POA: Diagnosis not present

## 2023-07-13 DIAGNOSIS — Z860101 Personal history of adenomatous and serrated colon polyps: Secondary | ICD-10-CM | POA: Diagnosis not present

## 2023-07-13 DIAGNOSIS — Z86007 Personal history of in-situ neoplasm of skin: Secondary | ICD-10-CM | POA: Diagnosis not present

## 2023-07-13 DIAGNOSIS — R195 Other fecal abnormalities: Secondary | ICD-10-CM | POA: Diagnosis not present

## 2023-07-13 DIAGNOSIS — Z08 Encounter for follow-up examination after completed treatment for malignant neoplasm: Secondary | ICD-10-CM | POA: Diagnosis not present

## 2023-07-13 DIAGNOSIS — R1312 Dysphagia, oropharyngeal phase: Secondary | ICD-10-CM | POA: Diagnosis not present

## 2023-07-13 DIAGNOSIS — Z8679 Personal history of other diseases of the circulatory system: Secondary | ICD-10-CM | POA: Diagnosis not present

## 2023-07-26 DIAGNOSIS — R051 Acute cough: Secondary | ICD-10-CM | POA: Diagnosis not present

## 2023-07-26 DIAGNOSIS — J320 Chronic maxillary sinusitis: Secondary | ICD-10-CM | POA: Diagnosis not present

## 2023-08-10 DIAGNOSIS — B353 Tinea pedis: Secondary | ICD-10-CM | POA: Diagnosis not present

## 2023-08-10 DIAGNOSIS — L821 Other seborrheic keratosis: Secondary | ICD-10-CM | POA: Diagnosis not present

## 2023-08-16 ENCOUNTER — Other Ambulatory Visit: Payer: Self-pay | Admitting: Cardiovascular Disease

## 2023-08-25 DIAGNOSIS — B353 Tinea pedis: Secondary | ICD-10-CM | POA: Diagnosis not present

## 2023-08-25 DIAGNOSIS — H938X3 Other specified disorders of ear, bilateral: Secondary | ICD-10-CM | POA: Diagnosis not present

## 2023-08-25 DIAGNOSIS — J3489 Other specified disorders of nose and nasal sinuses: Secondary | ICD-10-CM | POA: Diagnosis not present

## 2023-09-07 DIAGNOSIS — B353 Tinea pedis: Secondary | ICD-10-CM | POA: Diagnosis not present

## 2023-09-08 ENCOUNTER — Ambulatory Visit: Payer: Medicare HMO | Attending: Cardiovascular Disease | Admitting: Cardiovascular Disease

## 2023-09-08 ENCOUNTER — Encounter: Payer: Self-pay | Admitting: Cardiovascular Disease

## 2023-09-08 VITALS — BP 124/82 | HR 64 | Ht 75.0 in | Wt 218.8 lb

## 2023-09-08 DIAGNOSIS — M549 Dorsalgia, unspecified: Secondary | ICD-10-CM

## 2023-09-08 DIAGNOSIS — Z951 Presence of aortocoronary bypass graft: Secondary | ICD-10-CM | POA: Diagnosis not present

## 2023-09-08 DIAGNOSIS — I10 Essential (primary) hypertension: Secondary | ICD-10-CM | POA: Diagnosis not present

## 2023-09-08 DIAGNOSIS — E118 Type 2 diabetes mellitus with unspecified complications: Secondary | ICD-10-CM

## 2023-09-08 DIAGNOSIS — E785 Hyperlipidemia, unspecified: Secondary | ICD-10-CM

## 2023-09-08 DIAGNOSIS — I77810 Thoracic aortic ectasia: Secondary | ICD-10-CM

## 2023-09-08 DIAGNOSIS — I2510 Atherosclerotic heart disease of native coronary artery without angina pectoris: Secondary | ICD-10-CM | POA: Diagnosis not present

## 2023-09-08 DIAGNOSIS — E7800 Pure hypercholesterolemia, unspecified: Secondary | ICD-10-CM

## 2023-09-08 DIAGNOSIS — I714 Abdominal aortic aneurysm, without rupture, unspecified: Secondary | ICD-10-CM

## 2023-09-08 LAB — CBC WITH DIFFERENTIAL/PLATELET

## 2023-09-08 MED ORDER — NITROGLYCERIN 0.4 MG SL SUBL: 0.4000 mg | SUBLINGUAL_TABLET | SUBLINGUAL | 3 refills | Status: AC | PRN

## 2023-09-08 NOTE — Patient Instructions (Signed)
Medication Instructions:  Your refill for Nitroglycerin has been sent to your pharmacy,   *If you need a refill on your cardiac medications before your next appointment, please call your pharmacy*   Lab Work: You will have fasting labs drawn today. If you have labs (blood work) drawn today and your tests are completely normal, you will receive your results only by: MyChart Message (if you have MyChart) OR A paper copy in the mail If you have any lab test that is abnormal or we need to change your treatment, we will call you to review the results.   Testing/Procedures: Your physician has requested that you have an echocardiogram in May 2025. Echocardiography is a painless test that uses sound waves to create images of your heart. It provides your doctor with information about the size and shape of your heart and how well your heart's chambers and valves are working. This procedure takes approximately one hour. There are no restrictions for this procedure. Please do NOT wear cologne, perfume, aftershave, or lotions (deodorant is allowed). Please arrive 15 minutes prior to your appointment time.  Please note: We ask at that you not bring children with you during ultrasound (echo/ vascular) testing. Due to room size and safety concerns, children are not allowed in the ultrasound rooms during exams. Our front office staff cannot provide observation of children in our lobby area while testing is being conducted. An adult accompanying a patient to their appointment will only be allowed in the ultrasound room at the discretion of the ultrasound technician under special circumstances. We apologize for any inconvenience.    Follow-Up: At [facility], you and your health needs are our priority.  As part of our continuing mission to provide you with exceptional heart care, we have created designated Provider Care Teams.  These Care Teams include your primary Cardiologist (physician) and  Advanced Practice Providers (APPs -  Physician Assistants and Nurse Practitioners) who all work together to provide you with the care you need, when you need it.  We recommend signing up for the patient portal called "MyChart".  Sign up information is provided on this After Visit Summary.  MyChart is used to connect with patients for Virtual Visits (Telemedicine).  Patients are able to view lab/test results, encounter notes, upcoming appointments, etc.  Non-urgent messages can be sent to your provider as well.   To learn more about what you can do with MyChart, go to [email or web address].    Your next appointment:   5 month(s)  Provider:   Joni Reining, DNP, ANP    Then, Dr. Herbie Baltimore will plan to see you again in 1 year(s).

## 2023-09-09 LAB — COMPREHENSIVE METABOLIC PANEL
ALT: 17 [IU]/L (ref 0–44)
AST: 20 [IU]/L (ref 0–40)
Albumin: 4.5 g/dL (ref 3.8–4.8)
Alkaline Phosphatase: 56 [IU]/L (ref 44–121)
BUN/Creatinine Ratio: 13 (ref 10–24)
BUN: 13 mg/dL (ref 8–27)
Bilirubin Total: 0.8 mg/dL (ref 0.0–1.2)
CO2: 21 mmol/L (ref 20–29)
Calcium: 9.6 mg/dL (ref 8.6–10.2)
Chloride: 106 mmol/L (ref 96–106)
Creatinine, Ser: 1.02 mg/dL (ref 0.76–1.27)
Globulin, Total: 2.5 g/dL (ref 1.5–4.5)
Glucose: 106 mg/dL — ABNORMAL HIGH (ref 70–99)
Potassium: 4.4 mmol/L (ref 3.5–5.2)
Sodium: 143 mmol/L (ref 134–144)
Total Protein: 7 g/dL (ref 6.0–8.5)
eGFR: 78 mL/min/{1.73_m2} (ref >59)

## 2023-09-09 LAB — CBC WITH DIFFERENTIAL/PLATELET
Basos: 1 %
EOS (ABSOLUTE): 0 10*3/uL (ref 0.0–0.2)
Eos: 1 %
Hematocrit: 49 % (ref 37.5–51.0)
Hemoglobin: 16.2 g/dL (ref 13.0–17.7)
Immature Granulocytes: 0 %
Immature Granulocytes: 0 10*3/uL (ref 0.0–0.1)
Lymphs: 32 %
MCH: 32.1 pg (ref 26.6–33.0)
MCHC: 33.1 g/dL (ref 31.5–35.7)
MCV: 97 fL (ref 79–97)
Monocytes Absolute: 0.1 10*3/uL (ref 0.0–0.4)
Monocytes Absolute: 0.5 10*3/uL (ref 0.1–0.9)
Monocytes: 9 %
Neutrophils Absolute: 1.8 10*3/uL (ref 0.7–3.1)
Neutrophils Absolute: 3.3 10*3/uL (ref 1.4–7.0)
Neutrophils: 57 %
Platelets: 181 10*3/uL (ref 150–450)
RBC: 5.05 x10E6/uL (ref 4.14–5.80)
RDW: 13.1 % (ref 11.6–15.4)
WBC: 5.7 10*3/uL (ref 3.4–10.8)

## 2023-09-09 LAB — HEMOGLOBIN A1C
Est. average glucose Bld gHb Est-mCnc: 131 mg/dL
Hgb A1c MFr Bld: 6.2 % — ABNORMAL HIGH (ref 4.8–5.6)

## 2023-09-09 LAB — LIPID PANEL
Chol/HDL Ratio: 3.3 {ratio} (ref 0.0–5.0)
Cholesterol, Total: 96 mg/dL — ABNORMAL LOW (ref 100–199)
HDL: 29 mg/dL — ABNORMAL LOW (ref >39)
LDL Chol Calc (NIH): 49 mg/dL (ref 0–99)
Triglycerides: 92 mg/dL (ref 0–149)
VLDL Cholesterol Cal: 18 mg/dL (ref 5–40)

## 2023-09-09 LAB — TSH: TSH: 2.48 u[IU]/mL (ref 0.450–4.500)

## 2023-09-10 ENCOUNTER — Encounter: Payer: Self-pay | Admitting: Cardiovascular Disease
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
## 2023-09-17 DIAGNOSIS — H6593 Unspecified nonsuppurative otitis media, bilateral: Secondary | ICD-10-CM | POA: Diagnosis not present

## 2023-09-17 DIAGNOSIS — J069 Acute upper respiratory infection, unspecified: Secondary | ICD-10-CM | POA: Diagnosis not present

## 2023-10-07 ENCOUNTER — Other Ambulatory Visit: Payer: Self-pay | Admitting: Adult Health

## 2023-10-23 DIAGNOSIS — H00014 Hordeolum externum left upper eyelid: Secondary | ICD-10-CM | POA: Diagnosis not present

## 2023-10-23 DIAGNOSIS — H6591 Unspecified nonsuppurative otitis media, right ear: Secondary | ICD-10-CM | POA: Diagnosis not present

## 2023-10-23 DIAGNOSIS — J0100 Acute maxillary sinusitis, unspecified: Secondary | ICD-10-CM | POA: Diagnosis not present

## 2023-11-20 DIAGNOSIS — R051 Acute cough: Secondary | ICD-10-CM | POA: Diagnosis not present

## 2023-12-03 DIAGNOSIS — R300 Dysuria: Secondary | ICD-10-CM | POA: Diagnosis not present

## 2023-12-03 DIAGNOSIS — J309 Allergic rhinitis, unspecified: Secondary | ICD-10-CM | POA: Diagnosis not present

## 2023-12-11 DIAGNOSIS — Z20822 Contact with and (suspected) exposure to covid-19: Secondary | ICD-10-CM | POA: Diagnosis not present

## 2023-12-11 DIAGNOSIS — J101 Influenza due to other identified influenza virus with other respiratory manifestations: Secondary | ICD-10-CM | POA: Diagnosis not present

## 2023-12-11 DIAGNOSIS — B349 Viral infection, unspecified: Secondary | ICD-10-CM | POA: Diagnosis not present

## 2024-01-11 DIAGNOSIS — R208 Other disturbances of skin sensation: Secondary | ICD-10-CM | POA: Diagnosis not present

## 2024-01-11 DIAGNOSIS — L538 Other specified erythematous conditions: Secondary | ICD-10-CM | POA: Diagnosis not present

## 2024-01-11 DIAGNOSIS — B353 Tinea pedis: Secondary | ICD-10-CM | POA: Diagnosis not present

## 2024-01-11 DIAGNOSIS — H00014 Hordeolum externum left upper eyelid: Secondary | ICD-10-CM | POA: Diagnosis not present

## 2024-01-11 DIAGNOSIS — L820 Inflamed seborrheic keratosis: Secondary | ICD-10-CM | POA: Diagnosis not present

## 2024-01-11 DIAGNOSIS — Z789 Other specified health status: Secondary | ICD-10-CM | POA: Diagnosis not present

## 2024-01-11 DIAGNOSIS — L2989 Other pruritus: Secondary | ICD-10-CM | POA: Diagnosis not present

## 2024-01-18 ENCOUNTER — Telehealth: Payer: Self-pay | Admitting: Cardiovascular Disease

## 2024-01-18 NOTE — Telephone Encounter (Signed)
 Pt c/o of Chest Pain: STAT if active CP, including tightness, pressure, jaw pain, radiating pain to shoulder/upper arm/back, CP unrelieved by Nitro. Symptoms reported of SOB, nausea, vomiting, sweating.  1. Are you having CP right now? Yes    2. Are you experiencing any other symptoms (ex. SOB, nausea, vomiting, sweating)? No    3. Is your CP continuous or coming and going? Continuous    4. Have you taken Nitroglycerin ? No    5. How long have you been experiencing CP? 3 to 4 weeks    6. If NO CP at time of call then end call with telling Pt to call back or call 911 if Chest pain returns prior to return call from triage team.   States it hurts right where he was cut open for open heart surgery.

## 2024-01-18 NOTE — Telephone Encounter (Signed)
 Spoke with pt, he reports for several week now he has had a pressure in his chest. He reports it is in the middle of his chest and has been there pretty consistent. He does report that the pain gets worse with movement or taking a deep breath. Patient aware that is not heart pain. Patient feels like the wires they used after his CABG is causing the pain. [facility] nurse at [facility] felt the same and told him he needs to be seen. Again confirmed for the patient he is not having heart pain but he insists he needs to be seen. Follow up scheduled

## 2024-01-19 ENCOUNTER — Encounter: Payer: Self-pay | Admitting: Emergency Medicine

## 2024-01-19 ENCOUNTER — Ambulatory Visit: Attending: Emergency Medicine | Admitting: Emergency Medicine

## 2024-01-19 VITALS — BP 106/78 | HR 63 | Ht 75.0 in | Wt 220.6 lb

## 2024-01-19 DIAGNOSIS — E785 Hyperlipidemia, unspecified: Secondary | ICD-10-CM | POA: Diagnosis not present

## 2024-01-19 DIAGNOSIS — R0602 Shortness of breath: Secondary | ICD-10-CM | POA: Diagnosis not present

## 2024-01-19 DIAGNOSIS — R079 Chest pain, unspecified: Secondary | ICD-10-CM | POA: Diagnosis not present

## 2024-01-19 DIAGNOSIS — Z951 Presence of aortocoronary bypass graft: Secondary | ICD-10-CM

## 2024-01-19 DIAGNOSIS — I77810 Thoracic aortic ectasia: Secondary | ICD-10-CM

## 2024-01-19 DIAGNOSIS — Z79899 Other long term (current) drug therapy: Secondary | ICD-10-CM | POA: Diagnosis not present

## 2024-01-19 DIAGNOSIS — I10 Essential (primary) hypertension: Secondary | ICD-10-CM | POA: Diagnosis not present

## 2024-01-19 NOTE — Patient Instructions (Addendum)
 Medication Instructions:  NO CHANGES   Lab Work: CBC, BMET, AND D-DIMER TO BE DONE TODAY.   Testing/Procedures: A chest x-ray takes a picture of the organs and structures inside the chest, including the heart, lungs, and blood vessels. This test can show several things, including, whether the heart is enlarges; whether fluid is building up in the lungs; and whether pacemaker / defibrillator leads are still in place.       Please report to Radiology at the [facility] Main Entrance 30 minutes early for your test.  [address] [place], [place] [address]  How to Prepare for Your Cardiac PET/CT Stress Test:  Nothing to eat or drink, except water, 3 hours prior to arrival time.  NO caffeine/decaffeinated products, or chocolate 12 hours prior to arrival. (Please note decaffeinated beverages (teas/coffees) still contain caffeine).  If you have caffeine within 12 hours prior, the test will need to be rescheduled.  Medication instructions: Do not take erectile dysfunction medications for 72 hours prior to test (sildenafil, tadalafil) Do not take nitrates (isosorbide mononitrate, Ranexa) the day before or day of test Do not take tamsulosin  the day before or morning of test Hold theophylline containing medications for 12 hours. Hold Dipyridamole 48 hours prior to the test.  Diabetic Preparation: If able to eat breakfast prior to 3 hour fasting, you may take all medications, including your insulin . Do not worry if you miss your breakfast dose of insulin  - start at your next meal. If you do not eat prior to 3 hour fast-Hold all diabetes (oral and insulin ) medications. Patients who wear a continuous glucose monitor MUST remove the device prior to scanning.  You may take your remaining medications with water.  NO perfume, cologne or lotion on chest or abdomen area. FEMALES - Please avoid wearing dresses to this appointment.  Total time is 1 to 2 hours; you may want to bring  reading material for the waiting time.  IF YOU THINK YOU MAY BE PREGNANT, OR ARE NURSING PLEASE INFORM THE TECHNOLOGIST.  In preparation for your appointment, medication and supplies will be purchased.  Appointment availability is limited, so if you need to cancel or reschedule, please call the Radiology Department Scheduler at [phone] 24 hours in advance to avoid a cancellation fee of $100.00  What to Expect When you Arrive:  Once you arrive and check in for your appointment, you will be taken to a preparation room within the Radiology Department.  A technologist or Nurse will obtain your medical history, verify that you are correctly prepped for the exam, and explain the procedure.  Afterwards, an IV will be started in your arm and electrodes will be placed on your skin for EKG monitoring during the stress portion of the exam. Then you will be escorted to the PET/CT scanner.  There, staff will get you positioned on the scanner and obtain a blood pressure and EKG.  During the exam, you will continue to be connected to the EKG and blood pressure machines.  A small, safe amount of a radioactive tracer will be injected in your IV to obtain a series of pictures of your heart along with an injection of a stress agent.    After your Exam:  It is recommended that you eat a meal and drink a caffeinated beverage to counter act any effects of the stress agent.  Drink plenty of fluids for the remainder of the day and urinate frequently for the first couple of hours after  the exam.  Your doctor will inform you of your test results within 7-10 business days.  For more information and frequently asked questions, please visit our website: [email or web address]  For questions about your test or how to prepare for your test, please call: Cardiac Imaging Nurse Navigators Office: [phone]     Follow-Up: At [facility], you and your health needs are our priority.  As part of our  continuing mission to provide you with exceptional heart care, our providers are all part of one team.  This team includes your primary Cardiologist (physician) and Advanced Practice Providers or APPs (Physician Assistants and Nurse Practitioners) who all work together to provide you with the care you need, when you need it.  Your next appointment:   3 month(s)  Provider:   MADISON FOUNTAIN, DNP

## 2024-01-19 NOTE — Progress Notes (Signed)
 Cardiology Office Note:    Date:  01/19/2024  ID:  Kyle, Davidson 01-Nov-1950, MRN [id number] PCP: No primary care provider on file.  [facility] Providers Cardiologist:  Magnus Schuller, MD       Patient Profile:      Chief Complaint: Acute visit for chest pains History of Present Illness:  Kyle Davidson is a 73 y.o. male with visit-pertinent history of coronary artery disease s/p CABG x 2 in June 2023, hyperlipidemia, CVA  He experienced new onset chest discomfort in May 2023 and underwent coronary CTA which showed a calcium  score of 1324 representing 88 percentile.  He had severe multivessel disease and underwent definitive cardiac catheterization on Feb 11, 2022.  He was found to have multivessel coronary calcification with 70% ostial left main stenosis with 20 to 30% mid stenosis, proximal to mid 80% bifurcation LAD stenosis extending into the diagonal vessel, and diffuse 80 to 90% proximal to mid left circumflex stenosis.  He had a normal dominant RCA.  LV function was normal with LVEF at 55 to 60%.  LVEDP was 10.  He was ultimately discharged home and admitted to the hospital on February 25, 2022 for CABG revascularization.  He underwent LIMA to LAD and vein graft to OM.  Intraoperative echo showed LVEF 55 to 60%.  He had mild dilation of aortic root at 39 mm.  He was discharged on March 01, 2022.  His most recent CT chest angio in June 2024 did not demonstrate any acute intrathoracic pathology or aortic aneurysm or dissection.  He was last seen in office on 09/08/2023.  He denied any anginal type symptomatology.  He was doing well at the time.  No medication changes were made.  Routine echocardiogram was scheduled for 02/08/2024.  Patient called into nurse triage line on 01/10/2024 with complaints of chest pressure for several weeks.  He was scheduled for OV.   Discussed the use of AI scribe software for clinical note transcription with the patient, who gave verbal consent to  proceed.  History of Present Illness Kyle Davidson is a 73 year old male who presents with persistent chest pain and shortness of breath.  He has been experiencing persistent chest pain for several years, described as a pressure-like sensation located substernally, often near or on his surgical scar from his prior abdominal surgery.  He does note new onset shortness of breath starting several months ago.  He reports his symptoms start with shortness of breath and then comes the chest discomfort.  The pain worsens with deep breathing, certain movements, and on exertion, and is relieved by rest and Tylenol . He has not used his prescribed nitroglycerin  tablets. The pain occurs daily but subsides when he lies down at night.  He notes his surgical scar is tender to the touch.  Shortness of breath accompanies the chest pain, particularly during physical exertion such as walking or getting up from a seated position. He feels tired and sometimes struggles to catch his breath.  He underwent coronary artery bypass grafting with a LIMA graft to the LAD and a vein graft to the OM. Recent imaging in June 2024 showed no acute intrathoracic pathology or aortic aneurysm.  Review of systems:  Please see the history of present illness. All other systems are reviewed and otherwise negative.     Home Medications:    Current Meds  Medication Sig   Acetaminophen  (TYLENOL  PO) Take by mouth as needed.   amLODipine  (NORVASC ) 5 MG  tablet TAKE 1 TABLET (5 MG TOTAL) BY MOUTH DAILY.   aspirin  EC 81 MG tablet Take 1 tablet (81 mg total) by mouth daily. Swallow whole.   atorvastatin  (LIPITOR ) 80 MG tablet TAKE 1 TABLET BY MOUTH EVERY DAY   fenofibrate  (TRICOR ) 145 MG tablet TAKE 1 TABLET BY MOUTH EVERY DAY   fluticasone  (FLONASE ) 50 MCG/ACT nasal spray SPRAY 2 SPRAYS INTO EACH NOSTRIL EVERY DAY   hydrocortisone cream 1 % Apply 1 application  topically in the morning.   Loratadine  (CLARITIN  PO) Take by mouth daily.    metFORMIN (GLUCOPHAGE) 500 MG tablet Take 500 mg by mouth daily with breakfast.   metoprolol  tartrate (LOPRESSOR ) 25 MG tablet TAKE 1 TABLET BY MOUTH TWICE A DAY   nitroGLYCERIN  (NITROSTAT ) 0.4 MG SL tablet Place 1 tablet (0.4 mg total) under the tongue every 5 (five) minutes as needed for chest pain.   pantoprazole  (PROTONIX ) 40 MG tablet Take 40 mg by mouth 2 (two) times daily.   terbinafine  (LAMISIL ) 250 MG tablet Take 250 mg by mouth daily.   Studies Reviewed:   EKG Interpretation Date/Time:  Tuesday January 19 2024 14:13:54 EDT Ventricular Rate:  63 PR Interval:  230 QRS Duration:  106 QT Interval:  392 QTC Calculation: 401 R Axis:   10  Text Interpretation: Sinus rhythm with first degree AV block Incomplete right bundle branch block Confirmed by Palmer Bobo [phone]) on 01/19/2024 7:58:34 PM    Echocardiogram 02/11/2022  1. Left ventricular ejection fraction, by estimation, is 60 to 65%. The  left ventricle has normal function. The left ventricle has no regional  wall motion abnormalities. Left ventricular diastolic parameters are  consistent with Grade I diastolic  dysfunction (impaired relaxation).   2. Right ventricular systolic function is normal. The right ventricular  size is normal. Tricuspid regurgitation signal is inadequate for assessing  PA pressure.   3. The mitral valve is grossly normal. No evidence of mitral valve  regurgitation. The mean mitral valve gradient is 2.0 mmHg with average  heart rate of 78 bpm.   4. The aortic valve was not well visualized. Aortic valve regurgitation  is not visualized.   5. Aortic dilatation noted. There is mild dilatation of the aortic root,  measuring 40 mm. There is mild dilatation of the ascending aorta,  measuring 40 mm.   6. The inferior vena cava is normal in size with greater than 50%  respiratory variability, suggesting right atrial pressure of 3 mmHg.   Left heart catheterization 02/11/2022 Recommend elective surgical  consultation for consideration of CABG revascularization. The patient will be started on anti-ischemic medications including metoprolol  and amlodipine , hypoxia statin therapy and initiation of aspirin  81 mg. Outpatient surgery evaluation with Dr. Deloise Ferries per patient request.  Diagnostic Dominance: Right  Risk Assessment/Calculations:             Physical Exam:   VS:  BP 106/78 (BP Location: Left Arm, Patient Position: Sitting, Cuff Size: Normal)   Pulse 63   Ht 6\' 3"  (1.905 m)   Wt 220 lb 9.6 oz (100.1 kg)   SpO2 98%   BMI 27.57 kg/m    Wt Readings from Last 3 Encounters:  01/19/24 220 lb 9.6 oz (100.1 kg)  09/08/23 218 lb 12.8 oz (99.2 kg)  03/09/23 211 lb 6.4 oz (95.9 kg)    GEN: Well nourished, well developed in no acute distress NECK: No JVD; No carotid bruits CARDIAC: RRR, no murmurs, rubs, gallops RESPIRATORY:  Clear to auscultation without  rales, wheezing or rhonchi  ABDOMEN: Soft, non-tender, non-distended EXTREMITIES:  No edema; No acute deformity     Assessment and Plan:  Coronary artery disease / Chest pain / Shortness of breath Coronary CTA 01/2022 with calcium  score of 1324 (88 percentile) LHC 01/2022 showed multivessel coronary calcification and is now s/p CABG x 2 with LIMA to LAD and vein graft to OM on 02/2022 He continues to have persistent chest pain with reproducible pain to palpation for several years.  He notes that his chest pain is now on daily associated with shortness of breath on exertion over the past several months.  His symptoms have been daily with no changes in frequency or duration of his chest pains as further described in HPI. 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 - Most recent CT angio chest aorta 02/2023 showed no aortic aneurysm or dissection  Informed Consent   Shared Decision Making/Informed Consent The risks [chest pain, shortness of breath, cardiac arrhythmias, dizziness, blood pressure fluctuations, myocardial infarction, stroke/transient ischemic attack, nausea, vomiting, allergic reaction, radiation exposure, metallic taste sensation and life-threatening complications (estimated to be 1 in 10,000)], benefits (risk stratification, diagnosing coronary artery disease, treatment guidance) and alternatives of a cardiac PET stress test were discussed in detail with Mr. Bill and he agrees to proceed.         Signed, Ava Boatman, NP

## 2024-01-20 LAB — BASIC METABOLIC PANEL WITH GFR
BUN/Creatinine Ratio: 13 (ref 10–24)
BUN: 14 mg/dL (ref 8–27)
CO2: 21 mmol/L (ref 20–29)
Calcium: 9.5 mg/dL (ref 8.6–10.2)
Chloride: 105 mmol/L (ref 96–106)
Creatinine, Ser: 1.04 mg/dL (ref 0.76–1.27)
Glucose: 97 mg/dL (ref 70–99)
Potassium: 4.3 mmol/L (ref 3.5–5.2)
Sodium: 144 mmol/L (ref 134–144)
eGFR: 76 mL/min/{1.73_m2} (ref >59)

## 2024-01-20 LAB — CBC
Hematocrit: 47.7 % (ref 37.5–51.0)
Hemoglobin: 16 g/dL (ref 13.0–17.7)
MCH: 32.7 pg (ref 26.6–33.0)
MCHC: 33.5 g/dL (ref 31.5–35.7)
MCV: 97 fL (ref 79–97)
Platelets: 184 10*3/uL (ref 150–450)
RBC: 4.9 x10E6/uL (ref 4.14–5.80)
RDW: 13 % (ref 11.6–15.4)
WBC: 7.7 10*3/uL (ref 3.4–10.8)

## 2024-01-20 LAB — D-DIMER, QUANTITATIVE: D-DIMER: 0.23 mg{FEU}/L (ref 0.00–0.49)

## 2024-01-25 ENCOUNTER — Telehealth (HOSPITAL_COMMUNITY): Payer: Self-pay | Admitting: *Deleted

## 2024-01-25 NOTE — Telephone Encounter (Signed)
 Reaching out to patient to offer assistance regarding upcoming cardiac imaging study; pt verbalizes understanding of appt date/time, parking situation and where to check in, pre-test NPO status and verified current allergies; name and call back number provided for further questions should they arise  Larey Brick RN Navigator Cardiac Imaging [facility] [phone] office [phone] cell  Patient aware to avoid caffeine 12 hours prior to his cardiac PET scan.

## 2024-01-26 ENCOUNTER — Ambulatory Visit (HOSPITAL_BASED_OUTPATIENT_CLINIC_OR_DEPARTMENT_OTHER)
Admission: RE | Admit: 2024-01-26 | Discharge: 2024-01-26 | Disposition: A | Discharge status: Home / Self Care | Source: Ambulatory Visit | Attending: Emergency Medicine | Admitting: Emergency Medicine

## 2024-01-26 ENCOUNTER — Ambulatory Visit (HOSPITAL_COMMUNITY)
Admission: RE | Admit: 2024-01-26 | Discharge: 2024-01-26 | Disposition: A | Discharge status: Home / Self Care | Source: Ambulatory Visit | Attending: Cardiology | Admitting: Cardiology

## 2024-01-26 DIAGNOSIS — R079 Chest pain, unspecified: Secondary | ICD-10-CM | POA: Diagnosis not present

## 2024-01-26 DIAGNOSIS — Z981 Arthrodesis status: Secondary | ICD-10-CM | POA: Diagnosis not present

## 2024-01-26 DIAGNOSIS — J9811 Atelectasis: Secondary | ICD-10-CM | POA: Diagnosis not present

## 2024-01-26 DIAGNOSIS — R918 Other nonspecific abnormal finding of lung field: Secondary | ICD-10-CM | POA: Diagnosis not present

## 2024-01-26 LAB — NM PET CT CARDIAC PERFUSION MULTI W/ABSOLUTE BLOODFLOW
LV dias vol: 97 mL (ref 62–150)
LV sys vol: 38 mL
MBFR: 1.93
Nuc Rest EF: 61 %
Nuc Stress EF: 67 %
Peak HR: 79 {beats}/min
Rest HR: 62 {beats}/min
Rest MBF: 0.69 ml/g/min
Rest Nuclear Isotope Dose: 25.9 mCi
ST Depression (mm): 0 mm
Stress MBF: 1.33 ml/g/min
Stress Nuclear Isotope Dose: 25.9 mCi

## 2024-01-26 MED ORDER — RUBIDIUM RB82 GENERATOR (RUBYFILL)
25.8700 | PACK | Freq: Once | INTRAVENOUS | Status: AC
  Administered 2024-01-26: 25.87 via INTRAVENOUS

## 2024-01-26 MED ORDER — REGADENOSON 0.4 MG/5ML IV SOLN
0.4000 mg | Freq: Once | INTRAVENOUS | Status: AC
  Administered 2024-01-26: 0.4 mg via INTRAVENOUS

## 2024-01-26 MED ORDER — RUBIDIUM RB82 GENERATOR (RUBYFILL)
25.8600 | PACK | Freq: Once | INTRAVENOUS | Status: AC
  Administered 2024-01-26: 25.86 via INTRAVENOUS

## 2024-01-26 MED ORDER — REGADENOSON 0.4 MG/5ML IV SOLN
INTRAVENOUS | Status: AC
  Filled 2024-01-26: qty 5

## 2024-01-26 NOTE — Progress Notes (Signed)
 Patient to [facility] from home with wife Leary Provencal  for NM PET CT CARDIAC PERFUSION study. Patient has been complaining of centralized chest pain with accompanied shortness of breath when he takes deep breath. Patient mentioned he was having chest pain prior to scan that has been ongoing for several weeks, around sternotomy. NM study was completed. Patient states " that I experienced shortness of breath immediately after lexi-scan was given". Patient was monitored for 7 minutes post lexi-scan administration. No significant drop in blood pressure or heart rate. See flowsheets for documentation. After completion of exam patient was taken out of scanner and sat up. Patient has complaints of headache and worsening chest pain. No change in vitals, no EKG changes noted. Patient given shasta. This RN had discussion about patients chest pain and availability of nitroglycerin . Patient mentions he carries it with him yet has not ever taken one. This around provided education about nitroglycerin  administration and when to take them, and to call [phone] if he should exceed two doses. This RN asked if patient would like to go to ED for further evaluation and patient denies and mentioned his wife would arrive shortly as they had another scan at [facility]. This RN went out front to check on patient a few more times before patient departed [facility]. Wife was present for further discussion about going to Emergency department and she mentioned that his pain has been ongoing for weeks. This RN asked again about going to ED and patient denies. Wife took patient to [facility] for appointment. NO obvious distress upon leaving. Vitals were documented. Skin was pink, warm and dry. Still complaints of headache and chest pain.

## 2024-02-05 ENCOUNTER — Ambulatory Visit: Payer: Self-pay | Admitting: Emergency Medicine

## 2024-02-07 ENCOUNTER — Other Ambulatory Visit: Payer: Self-pay | Admitting: Cardiovascular Disease

## 2024-02-08 ENCOUNTER — Ambulatory Visit (HOSPITAL_COMMUNITY)
Admission: RE | Admit: 2024-02-08 | Discharge: 2024-02-08 | Disposition: A | Discharge status: Home / Self Care | Payer: Medicare HMO | Source: Ambulatory Visit | Attending: Cardiovascular Disease | Admitting: Cardiovascular Disease

## 2024-02-08 ENCOUNTER — Other Ambulatory Visit (HOSPITAL_COMMUNITY)

## 2024-02-08 DIAGNOSIS — I2510 Atherosclerotic heart disease of native coronary artery without angina pectoris: Secondary | ICD-10-CM | POA: Diagnosis not present

## 2024-02-08 LAB — ECHOCARDIOGRAM COMPLETE
Area-P 1/2: 3.53 cm2
S' Lateral: 2.4 cm

## 2024-02-11 ENCOUNTER — Ambulatory Visit: Payer: Self-pay | Admitting: Cardiovascular Disease

## 2024-03-30 ENCOUNTER — Other Ambulatory Visit: Payer: Self-pay | Admitting: Adult Health

## 2024-03-30 ENCOUNTER — Other Ambulatory Visit: Payer: Self-pay | Admitting: Cardiovascular Disease

## 2024-03-31 ENCOUNTER — Other Ambulatory Visit: Payer: Self-pay | Admitting: Cardiovascular Disease

## 2024-03-31 DIAGNOSIS — J069 Acute upper respiratory infection, unspecified: Secondary | ICD-10-CM | POA: Diagnosis not present

## 2024-04-19 ENCOUNTER — Encounter: Payer: Self-pay | Admitting: Emergency Medicine

## 2024-04-19 ENCOUNTER — Ambulatory Visit: Attending: Internal Medicine | Admitting: Emergency Medicine

## 2024-04-19 VITALS — BP 118/64 | HR 77 | Ht 74.0 in | Wt 220.0 lb

## 2024-04-19 DIAGNOSIS — E785 Hyperlipidemia, unspecified: Secondary | ICD-10-CM

## 2024-04-19 DIAGNOSIS — R079 Chest pain, unspecified: Secondary | ICD-10-CM

## 2024-04-19 DIAGNOSIS — I77810 Thoracic aortic ectasia: Secondary | ICD-10-CM

## 2024-04-19 DIAGNOSIS — Z951 Presence of aortocoronary bypass graft: Secondary | ICD-10-CM

## 2024-04-19 DIAGNOSIS — I10 Essential (primary) hypertension: Secondary | ICD-10-CM | POA: Diagnosis not present

## 2024-04-19 DIAGNOSIS — I2510 Atherosclerotic heart disease of native coronary artery without angina pectoris: Secondary | ICD-10-CM | POA: Diagnosis not present

## 2024-04-19 MED ORDER — METOPROLOL SUCCINATE ER 25 MG PO TB24: 25.0000 mg | ORAL_TABLET | Freq: Every day | ORAL | 3 refills | Status: DC

## 2024-04-19 NOTE — Progress Notes (Signed)
 Cardiology Office Note:    Date:  04/19/2024  ID:  Kyle Davidson 17-Dec-1950, MRN [id number] PCP: Lynwood Laneta LELON DEVONNA  [facility] Providers Cardiologist:  Alm Clay, MD       Patient Profile:       Chief Complaint: 3-month follow-up History of Present Illness:  Kyle Davidson is a 73 y.o. male with visit-pertinent history of coronary artery disease s/p CABG x 2 in June 2023, hyperlipidemia, CVA, ascending aortic dilation   He experienced new onset chest discomfort in May 2023 and underwent coronary CTA which showed a calcium  score of 1324 representing 88 percentile.  He had severe multivessel disease and underwent definitive cardiac catheterization on Feb 11, 2022.  He was found to have multivessel coronary calcification with 70% ostial left main stenosis with 20 to 30% mid stenosis, proximal to mid 80% bifurcation LAD stenosis extending into the diagonal vessel, and diffuse 80 to 90% proximal to mid left circumflex stenosis.  He had a normal dominant RCA.  LV function was normal with LVEF at 55 to 60%.  LVEDP was 10.  He was ultimately discharged home and admitted to the hospital on February 25, 2022 for CABG revascularization.  He underwent LIMA to LAD and vein graft to OM.  Intraoperative echo showed LVEF 55 to 60%.  He had mild dilation of aortic root at 39 mm.  He was discharged on March 01, 2022.  His most recent CT chest angio in June 2024 did not demonstrate any acute intrathoracic pathology or aortic aneurysm or dissection.   He was last seen in office on 09/08/2023.  He denied any anginal type symptomatology.  He was doing well at the time.  No medication changes were made.  Routine echocardiogram was scheduled for 02/08/2024.   Patient called into nurse triage line on 01/10/2024 with complaints of chest pressure for several weeks.  He was scheduled for OV.  Patient was last seen in office on 01/19/2024.  He been experiencing persistent chest pressure for several years  often near his surgical scar from his prior abdominal surgery.  He had reproducible pain to palpation.  He now reports symptoms of shortness of breath over the past several months.  He underwent cardiac PET/CT on 01/26/2024 that was normal low risk study.  Echocardiogram on 02/08/2024 showed LVEF 50 to 55%, no RWMA, mild LVH, grade 1 DD, RV function and size normal, left atrial size mildly dilated, trivial mitral valve regurgitation, trivial aortic valve regurgitation, mild dilation of aortic root measuring 38 mm, mild dilation of ascending aorta measuring 42 mm.   Discussed the use of AI scribe software for clinical note transcription with the patient, who gave verbal consent to proceed.  History of Present Illness Kyle Davidson is a 73 year old male with coronary artery disease who presents with ongoing chest pain and shortness of breath. He is accompanied by his wife, Kyle Davidson.  Today patient is doing well overall.  He reports continued chest pains and improved/resolved shortness of breath.  He experiences chest pain around his CABG surgical scar, sometimes worsened by pressure, and radiating to other areas. Shortness of breath occurs during exertion, has improved however he does continue to experience fatigue.  He does experience some chest wall pain when he sneezes or coughs.  He has not had to use his nitroglycerin .  He denies any chest pains on exertion.  No orthopnea, PND, syncope, presyncope, lightheadedness, dizziness.  He reports fatigue and weakness after physical activities,  with heat exacerbating fatigue. Ankle swelling occurs, especially in hot weather or after traveling.  Current medications include aspirin , amlodipine , atorvastatin , fenofibrate , metoprolol , nitroglycerin  (as needed), pantoprazole , and Claritin . He takes metoprolol  twice daily and nitroglycerin  as needed, though not recently.  Review of systems:  Please see the history of present illness. All other systems are reviewed  and otherwise negative.      Studies Reviewed:        Echocardiogram 02/11/2022  1. Left ventricular ejection fraction, by estimation, is 60 to 65%. The  left ventricle has normal function. The left ventricle has no regional  wall motion abnormalities. Left ventricular diastolic parameters are  consistent with Grade I diastolic  dysfunction (impaired relaxation).   2. Right ventricular systolic function is normal. The right ventricular  size is normal. Tricuspid regurgitation signal is inadequate for assessing  PA pressure.   3. The mitral valve is grossly normal. No evidence of mitral valve  regurgitation. The mean mitral valve gradient is 2.0 mmHg with average  heart rate of 78 bpm.   4. The aortic valve was not well visualized. Aortic valve regurgitation  is not visualized.   5. Aortic dilatation noted. There is mild dilatation of the aortic root,  measuring 40 mm. There is mild dilatation of the ascending aorta,  measuring 40 mm.   6. The inferior vena cava is normal in size with greater than 50%  respiratory variability, suggesting right atrial pressure of 3 mmHg.    Left heart catheterization 02/11/2022 Recommend elective surgical consultation for consideration of CABG revascularization. The patient will be started on anti-ischemic medications including metoprolol  and amlodipine , hypoxia statin therapy and initiation of aspirin  81 mg. Outpatient surgery evaluation with Dr. Shyrl per patient request.  Diagnostic Dominance: Right   Risk Assessment/Calculations:              Physical Exam:   VS:  BP 118/64 (BP Location: Left Arm, Patient Position: Sitting, Cuff Size: Normal)   Pulse 77   Ht 6' 2 (1.88 m)   Wt 220 lb (99.8 kg)   BMI 28.25 kg/m    Wt Readings from Last 3 Encounters:  04/19/24 220 lb (99.8 kg)  01/19/24 220 lb 9.6 oz (100.1 kg)  09/08/23 218 lb 12.8 oz (99.2 kg)    GEN: Well nourished, well developed in no acute distress NECK: No JVD; No carotid  bruits CARDIAC: RRR, no murmurs, rubs, gallops RESPIRATORY:  Clear to auscultation without rales, wheezing or rhonchi  ABDOMEN: Soft, non-tender, non-distended EXTREMITIES:  No edema; No acute deformity      Assessment and Plan:  Coronary artery disease s/p CABG Coronary CTA 01/2022 with calcium  score of 1324 (88 percentile) LHC 01/2022 showed multivessel coronary calcification and is now s/p CABG x 2 with LIMA to LAD and vein graft to OM on 02/2022 Cardiac PET/CT 01/2024 was normal low risk study with no acute CT chest findings, rest EF 61% Echocardiogram 01/2024 with LVEF 50 to 55% D-dimer 12/2023 was normal - He does continue to have reproducible chest pain to palpation of his sternum since CABG.  His symptoms do occur daily and chest wall pain worsens with cough or sneezing.  Shortness of breath has improved and fatigue continues.  He is without any chest pains exacerbated by exertion.  Has not had to take nitroglycerin  - His most worrisome symptom today seems to be fatigue.  Will change his metoprolol  tartrate to succinate taken in the p.m. to see if this improves his symptoms -  His recent cardiac PET was reassuring with no ischemia or infarction.  His symptoms described are likely musculoskeletal.  No indication for further intervention/testing at this time.  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  Signed, Lum LITTIE Louis, NP

## 2024-04-19 NOTE — Patient Instructions (Addendum)
 Medication Instructions:  STOP TAKING METOPROLOL  TARTRATE. START TAKING METOPROLOL  SUCCINATE 25 MG AT BEDTIME.   Lab Work: NONE TODAY  Testing/Procedures: NONE  Follow-Up: At [facility], you and your health needs are our priority.  As part of our continuing mission to provide you with exceptional heart care, our providers are all part of one team.  This team includes your primary Cardiologist (physician) and Advanced Practice Providers or APPs (Physician Assistants and Nurse Practitioners) who all work together to provide you with the care you need, when you need it.  Your next appointment:   3 MONTHS  Provider:   DR. HARDING, MD (NEW PATIENT, FORMER [facility]ER PATIENT)

## 2024-04-26 ENCOUNTER — Other Ambulatory Visit (HOSPITAL_COMMUNITY)

## 2024-05-20 DIAGNOSIS — R399 Unspecified symptoms and signs involving the genitourinary system: Secondary | ICD-10-CM | POA: Diagnosis not present

## 2024-05-20 DIAGNOSIS — H6993 Unspecified Eustachian tube disorder, bilateral: Secondary | ICD-10-CM | POA: Diagnosis not present

## 2024-05-20 DIAGNOSIS — H7292 Unspecified perforation of tympanic membrane, left ear: Secondary | ICD-10-CM | POA: Diagnosis not present

## 2024-05-20 DIAGNOSIS — B379 Candidiasis, unspecified: Secondary | ICD-10-CM | POA: Diagnosis not present

## 2024-05-20 DIAGNOSIS — R369 Urethral discharge, unspecified: Secondary | ICD-10-CM | POA: Diagnosis not present

## 2024-05-20 DIAGNOSIS — H9192 Unspecified hearing loss, left ear: Secondary | ICD-10-CM | POA: Diagnosis not present

## 2024-06-15 DIAGNOSIS — J309 Allergic rhinitis, unspecified: Secondary | ICD-10-CM | POA: Diagnosis not present

## 2024-06-15 DIAGNOSIS — H9202 Otalgia, left ear: Secondary | ICD-10-CM | POA: Diagnosis not present

## 2024-06-15 DIAGNOSIS — H903 Sensorineural hearing loss, bilateral: Secondary | ICD-10-CM | POA: Diagnosis not present

## 2024-06-16 DIAGNOSIS — I10 Essential (primary) hypertension: Secondary | ICD-10-CM | POA: Diagnosis not present

## 2024-06-16 DIAGNOSIS — R7303 Prediabetes: Secondary | ICD-10-CM | POA: Diagnosis not present

## 2024-06-16 DIAGNOSIS — Z5181 Encounter for therapeutic drug level monitoring: Secondary | ICD-10-CM | POA: Diagnosis not present

## 2024-06-16 DIAGNOSIS — E785 Hyperlipidemia, unspecified: Secondary | ICD-10-CM | POA: Diagnosis not present

## 2024-06-16 DIAGNOSIS — R17 Unspecified jaundice: Secondary | ICD-10-CM | POA: Diagnosis not present

## 2024-06-16 DIAGNOSIS — H9202 Otalgia, left ear: Secondary | ICD-10-CM | POA: Diagnosis not present

## 2024-06-16 DIAGNOSIS — Z79899 Other long term (current) drug therapy: Secondary | ICD-10-CM | POA: Diagnosis not present

## 2024-06-16 DIAGNOSIS — I77810 Thoracic aortic ectasia: Secondary | ICD-10-CM | POA: Diagnosis not present

## 2024-06-16 DIAGNOSIS — I2510 Atherosclerotic heart disease of native coronary artery without angina pectoris: Secondary | ICD-10-CM | POA: Diagnosis not present

## 2024-06-16 DIAGNOSIS — Z0000 Encounter for general adult medical examination without abnormal findings: Secondary | ICD-10-CM | POA: Diagnosis not present

## 2024-06-16 DIAGNOSIS — N50819 Testicular pain, unspecified: Secondary | ICD-10-CM | POA: Diagnosis not present

## 2024-06-16 DIAGNOSIS — Z125 Encounter for screening for malignant neoplasm of prostate: Secondary | ICD-10-CM | POA: Diagnosis not present

## 2024-07-18 ENCOUNTER — Ambulatory Visit: Admitting: Cardiology

## 2024-07-22 ENCOUNTER — Ambulatory Visit: Attending: Cardiology | Admitting: Cardiology

## 2024-07-22 VITALS — BP 138/72 | HR 66 | Ht 75.0 in | Wt 224.0 lb

## 2024-07-22 DIAGNOSIS — I2510 Atherosclerotic heart disease of native coronary artery without angina pectoris: Secondary | ICD-10-CM

## 2024-07-22 DIAGNOSIS — R7303 Prediabetes: Secondary | ICD-10-CM

## 2024-07-22 DIAGNOSIS — G8918 Other acute postprocedural pain: Secondary | ICD-10-CM | POA: Diagnosis not present

## 2024-07-22 DIAGNOSIS — R600 Localized edema: Secondary | ICD-10-CM | POA: Diagnosis not present

## 2024-07-22 DIAGNOSIS — I77810 Thoracic aortic ectasia: Secondary | ICD-10-CM | POA: Diagnosis not present

## 2024-07-22 DIAGNOSIS — E785 Hyperlipidemia, unspecified: Secondary | ICD-10-CM | POA: Diagnosis not present

## 2024-07-22 DIAGNOSIS — R0609 Other forms of dyspnea: Secondary | ICD-10-CM

## 2024-07-22 DIAGNOSIS — I10 Essential (primary) hypertension: Secondary | ICD-10-CM

## 2024-07-22 DIAGNOSIS — Z951 Presence of aortocoronary bypass graft: Secondary | ICD-10-CM | POA: Diagnosis not present

## 2024-07-22 NOTE — Patient Instructions (Addendum)
 Medication Instructions:     STOP TAKING metoprolol  TARTRATE IN THE MORNING    KEEP TAKING METOPROLOL  SUCCINATE AT BEDTIME   *If you need a refill on your cardiac medications before your next appointment, please call your pharmacy*   Lab Work: NOT NEEDED    Testing/Procedures:  NOT  NEEDED  Follow-Up: At [facility], you and your health needs are our priority.  As part of our continuing mission to provide you with exceptional heart care, we have created designated Provider Care Teams.  These Care Teams include your primary Cardiologist (physician) and Advanced Practice Providers (APPs -  Physician Assistants and Nurse Practitioners) who all work together to provide you with the care you need, when you need it.     Your next appointment:   4 TO 5  month(s)- Feb OR March 2026  The format for your next appointment:   In Person  Provider:   Alm Clay, MD
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
## 2024-07-25 ENCOUNTER — Encounter: Payer: Self-pay | Admitting: Cardiology

## 2024-07-25 DIAGNOSIS — R600 Localized edema: Secondary | ICD-10-CM | POA: Insufficient documentation

## 2024-07-25 DIAGNOSIS — I2510 Atherosclerotic heart disease of native coronary artery without angina pectoris: Secondary | ICD-10-CM | POA: Insufficient documentation

## 2024-07-25 NOTE — Assessment & Plan Note (Signed)
 Blood pressure generally well-controlled. Metoprolol  tartrate replaced with metoprolol  succinate to simplify dosing and improve energy. - Continue amlodipine  5 mg daily in the morning. - Switch to metoprolol  succinate 25 mg at bedtime.

## 2024-07-25 NOTE — Assessment & Plan Note (Signed)
 Well-controlled with atorvastatin  and fenofibrate . LDL 54, total cholesterol 899, triglycerides 152, HDL 23. - Continue atorvastatin  80 mg daily. - Triglycerides also relatively well-controlled.  Continue fenofibrate  145 mg daily. - Encourage regular physical activity to improve HDL.

## 2024-07-25 NOTE — Assessment & Plan Note (Signed)
 He still has postop chest pain.  Musculoskeletal in nature.  I think he is fine with taking NSAIDs plus minus Tylenol .  Can use heating pad as well.

## 2024-07-25 NOTE — Assessment & Plan Note (Signed)
 Post-CABG with normal stress test and echocardiogram. EF 55-60%. Intermittent chest pain likely post-surgical-mostly reproducible along bilateral sternal border.. No atrial fibrillation.  - Continue aspirin  81 mg daily. - Continue atorvastatin  80 mg daily. - Continue fenofibrate  145 mg daily - Continue amlodipine  5 mg daily, and Toprol  25 mg daily => switch to p.m. dosing for Toprol . -Has not required PRN  Nitroglycerin .

## 2024-07-25 NOTE — Assessment & Plan Note (Signed)
 2-1/2 years out from CABG.  Not really having any active anginal symptoms just some musculoskeletal symptoms from postop chest pain.  Exertional dyspnea evaluated with a echocardiogram and stress PET both of which were normal.  Would not necessarily require further testing for another 4 to 5 years depending on symptoms.

## 2024-07-25 NOTE — Assessment & Plan Note (Signed)
  Peripheral edema, likely medication-induced Edema likely due to amlodipine . Compression socks recommended. - Consider switching from amlodipine  to valsartan to address edema. - Advise wearing compression socks during prolonged standing or travel.

## 2024-07-25 NOTE — Assessment & Plan Note (Signed)
 Actually seems to be improved.  Echocardiogram and Stress PET were relatively reassuring.  I think a lot of it has to do with deconditioning.

## 2024-07-25 NOTE — Assessment & Plan Note (Signed)
 A1c as of December 2024 was 6.2.  Discussed dietary modification and exercise.  Continue metformin 500 mg daily.

## 2024-07-25 NOTE — Assessment & Plan Note (Signed)
 Enlarged aorta, under surveillance Aorta 4.2 cm, under surveillance. No intervention unless >5.5 cm. - Continue regular monitoring of aortic size.  I do not think he needs annual evaluation.  Can be at least every couple years.

## 2024-08-08 DIAGNOSIS — J069 Acute upper respiratory infection, unspecified: Secondary | ICD-10-CM | POA: Diagnosis not present

## 2024-08-08 DIAGNOSIS — H1032 Unspecified acute conjunctivitis, left eye: Secondary | ICD-10-CM | POA: Diagnosis not present

## 2024-08-08 DIAGNOSIS — R0981 Nasal congestion: Secondary | ICD-10-CM | POA: Diagnosis not present

## 2024-09-02 ENCOUNTER — Other Ambulatory Visit: Payer: Self-pay | Admitting: Cardiology

## 2024-09-05 MED ORDER — FENOFIBRATE 145 MG PO TABS: 145.0000 mg | ORAL_TABLET | Freq: Every day | ORAL | 1 refills | Status: DC

## 2024-09-23 ENCOUNTER — Telehealth: Payer: Self-pay | Admitting: Cardiology

## 2024-09-23 ENCOUNTER — Other Ambulatory Visit: Payer: Self-pay | Admitting: Cardiology

## 2024-09-23 DIAGNOSIS — I2510 Atherosclerotic heart disease of native coronary artery without angina pectoris: Secondary | ICD-10-CM

## 2024-09-23 DIAGNOSIS — R7303 Prediabetes: Secondary | ICD-10-CM

## 2024-09-23 DIAGNOSIS — I10 Essential (primary) hypertension: Secondary | ICD-10-CM

## 2024-09-23 MED ORDER — AMLODIPINE BESYLATE 5 MG PO TABS: 5.0000 mg | ORAL_TABLET | Freq: Every day | ORAL | 1 refills | Status: AC

## 2024-09-23 MED ORDER — ATORVASTATIN CALCIUM 80 MG PO TABS: 80.0000 mg | ORAL_TABLET | Freq: Every day | ORAL | 1 refills | Status: AC

## 2024-09-23 MED ORDER — FENOFIBRATE 145 MG PO TABS: 145.0000 mg | ORAL_TABLET | Freq: Every day | ORAL | 1 refills | Status: AC

## 2024-09-23 MED ORDER — METOPROLOL SUCCINATE ER 25 MG PO TB24: 25.0000 mg | ORAL_TABLET | Freq: Every day | ORAL | 3 refills | Status: AC

## 2024-09-23 NOTE — Telephone Encounter (Signed)
 Refills sent earlier today. Pt knows to ask PCP about metformin

## 2024-09-23 NOTE — Telephone Encounter (Addendum)
 No, there should be a refill pool to send to.

## 2024-09-23 NOTE — Telephone Encounter (Signed)
 Hi. Pt came into office req a refill for rx's metformin, atorvastatin , fenofibrate , metoprolol  & amlodipine . His provider is [facility]. Can someone assist? Thank you.

## 2024-09-23 NOTE — Telephone Encounter (Signed)
Routed to refill team 

## 2024-09-23 NOTE — Telephone Encounter (Addendum)
 This pool if for medication assistance. Please refer to refill pool for assistance on refills.

## 2024-10-17 NOTE — Progress Notes (Signed)
 MARQUETTE BLODGETT                                          MRN: [id number]   10/17/2024   The [facility] Quality Team Specialist reviewed this patient medical record for the purposes of chart review for care gap closure. The following were reviewed: chart review for care gap closure-diabetic eye exam and kidney health evaluation for diabetes:eGFR  and uACR.    [facility] Quality Team

## 2024-11-22 ENCOUNTER — Ambulatory Visit: Admitting: Cardiology
